# Patient Record
Sex: Male | Born: 1960
Health system: Southern US, Community
[De-identification: ages and names within clinical notes are randomized; demographics above are authoritative.]

## PROBLEM LIST (undated history)

## (undated) DIAGNOSIS — E039 Hypothyroidism, unspecified: Secondary | ICD-10-CM

## (undated) DIAGNOSIS — M1990 Unspecified osteoarthritis, unspecified site: Secondary | ICD-10-CM

## (undated) DIAGNOSIS — C801 Malignant (primary) neoplasm, unspecified: Secondary | ICD-10-CM

## (undated) DIAGNOSIS — J309 Allergic rhinitis, unspecified: Secondary | ICD-10-CM

## (undated) DIAGNOSIS — R079 Chest pain, unspecified: Secondary | ICD-10-CM

## (undated) DIAGNOSIS — E119 Type 2 diabetes mellitus without complications: Secondary | ICD-10-CM

## (undated) DIAGNOSIS — F419 Anxiety disorder, unspecified: Secondary | ICD-10-CM

## (undated) DIAGNOSIS — I10 Essential (primary) hypertension: Secondary | ICD-10-CM

## (undated) HISTORY — DX: Chest pain, unspecified: R07.9

## (undated) HISTORY — PX: TONSILLECTOMY: SUR1361

## (undated) HISTORY — DX: Allergic rhinitis, unspecified: J30.9

## (undated) HISTORY — PX: COLONOSCOPY: SHX174

## (undated) HISTORY — DX: Anxiety disorder, unspecified: F41.9

## (undated) HISTORY — DX: Unspecified osteoarthritis, unspecified site: M19.90

## (undated) HISTORY — PX: KNEE ARTHROSCOPY: SUR90

## (undated) HISTORY — DX: Type 2 diabetes mellitus without complications: E11.9

## (undated) HISTORY — PX: ARTHROPLASTY: SHX135

---

## 1986-09-22 HISTORY — PX: ORIF FACIAL FRACTURE: SHX2118

## 2008-01-04 ENCOUNTER — Encounter: Admission: RE | Admit: 2008-01-04 | Discharge: 2008-02-10 | Payer: Self-pay | Admitting: Orthopedic Surgery

## 2010-09-22 DIAGNOSIS — C801 Malignant (primary) neoplasm, unspecified: Secondary | ICD-10-CM

## 2010-09-22 HISTORY — DX: Malignant (primary) neoplasm, unspecified: C80.1

## 2010-09-22 HISTORY — PX: PROSTATECTOMY: SHX69

## 2011-02-27 ENCOUNTER — Other Ambulatory Visit (HOSPITAL_COMMUNITY): Payer: Self-pay | Admitting: Urology

## 2011-02-27 DIAGNOSIS — C61 Malignant neoplasm of prostate: Secondary | ICD-10-CM

## 2011-03-05 ENCOUNTER — Encounter (HOSPITAL_COMMUNITY): Payer: Self-pay

## 2011-03-05 ENCOUNTER — Encounter (HOSPITAL_COMMUNITY)
Admission: RE | Admit: 2011-03-05 | Discharge: 2011-03-05 | Disposition: A | Discharge status: Home / Self Care | Payer: 59 | Source: Ambulatory Visit | Attending: Urology | Admitting: Urology

## 2011-03-05 DIAGNOSIS — C61 Malignant neoplasm of prostate: Secondary | ICD-10-CM | POA: Insufficient documentation

## 2011-03-05 HISTORY — DX: Malignant (primary) neoplasm, unspecified: C80.1

## 2011-03-05 MED ORDER — TECHNETIUM TC 99M MEDRONATE IV KIT
23.5000 | PACK | Freq: Once | INTRAVENOUS | Status: AC | PRN
  Administered 2011-03-05: 24 via INTRAVENOUS

## 2011-04-11 ENCOUNTER — Encounter (HOSPITAL_COMMUNITY): Payer: 59

## 2011-04-11 ENCOUNTER — Other Ambulatory Visit: Payer: Self-pay | Admitting: Urology

## 2011-04-11 ENCOUNTER — Ambulatory Visit (HOSPITAL_COMMUNITY)
Admission: RE | Admit: 2011-04-11 | Discharge: 2011-04-11 | Disposition: A | Discharge status: Home / Self Care | Payer: 59 | Source: Ambulatory Visit | Attending: Urology | Admitting: Urology

## 2011-04-11 ENCOUNTER — Other Ambulatory Visit (HOSPITAL_COMMUNITY): Payer: Self-pay | Admitting: Urology

## 2011-04-11 DIAGNOSIS — Z01818 Encounter for other preprocedural examination: Secondary | ICD-10-CM

## 2011-04-11 DIAGNOSIS — E119 Type 2 diabetes mellitus without complications: Secondary | ICD-10-CM | POA: Insufficient documentation

## 2011-04-11 DIAGNOSIS — Z01810 Encounter for preprocedural cardiovascular examination: Secondary | ICD-10-CM | POA: Insufficient documentation

## 2011-04-11 DIAGNOSIS — C61 Malignant neoplasm of prostate: Secondary | ICD-10-CM | POA: Insufficient documentation

## 2011-04-11 DIAGNOSIS — Z01812 Encounter for preprocedural laboratory examination: Secondary | ICD-10-CM | POA: Insufficient documentation

## 2011-04-11 LAB — CBC
Hemoglobin: 13.8 g/dL (ref 13.0–17.0)
MCV: 86.9 fL (ref 78.0–100.0)
Platelets: 238 10*3/uL (ref 150–400)
RBC: 4.5 MIL/uL (ref 4.22–5.81)
WBC: 6.3 10*3/uL (ref 4.0–10.5)

## 2011-04-11 LAB — SURGICAL PCR SCREEN
MRSA, PCR: NEGATIVE
Staphylococcus aureus: NEGATIVE

## 2011-04-11 LAB — BASIC METABOLIC PANEL
BUN: 13 mg/dL (ref 6–23)
Chloride: 106 mEq/L (ref 96–112)
GFR calc Af Amer: >60 mL/min (ref >60)
Potassium: 4.3 mEq/L (ref 3.5–5.1)

## 2011-04-14 ENCOUNTER — Other Ambulatory Visit: Payer: Self-pay | Admitting: Urology

## 2011-04-14 ENCOUNTER — Inpatient Hospital Stay (HOSPITAL_COMMUNITY)
Admission: RE | Admit: 2011-04-14 | Discharge: 2011-04-15 | DRG: 708 | Disposition: A | Discharge status: Home / Self Care | Payer: 59 | Source: Ambulatory Visit | Attending: Urology | Admitting: Urology

## 2011-04-14 DIAGNOSIS — Z794 Long term (current) use of insulin: Secondary | ICD-10-CM

## 2011-04-14 DIAGNOSIS — C61 Malignant neoplasm of prostate: Principal | ICD-10-CM | POA: Diagnosis present

## 2011-04-14 DIAGNOSIS — E119 Type 2 diabetes mellitus without complications: Secondary | ICD-10-CM | POA: Diagnosis present

## 2011-04-14 LAB — TYPE AND SCREEN
ABO/RH(D): A NEG
Antibody Screen: NEGATIVE

## 2011-04-14 LAB — HEMOGLOBIN AND HEMATOCRIT, BLOOD
HCT: 39.3 % (ref 39.0–52.0)
Hemoglobin: 13.6 g/dL (ref 13.0–17.0)

## 2011-04-14 LAB — GLUCOSE, CAPILLARY
Glucose-Capillary: 159 mg/dL — ABNORMAL HIGH (ref 70–99)
Glucose-Capillary: 259 mg/dL — ABNORMAL HIGH (ref 70–99)

## 2011-04-15 LAB — HEMOGLOBIN AND HEMATOCRIT, BLOOD
HCT: 35.7 % — ABNORMAL LOW (ref 39.0–52.0)
Hemoglobin: 12.5 g/dL — ABNORMAL LOW (ref 13.0–17.0)

## 2011-04-18 NOTE — Op Note (Signed)
NAMECORMICK, MOSS NO.:  [id number]  MEDICAL RECORD NO.:  [id number]  LOCATION:  1431                         FACILITY:  [facility]  PHYSICIAN:  Heloise Purpura, MD      DATE OF BIRTH:  [birth year]  DATE OF PROCEDURE:  04/14/2011 DATE OF DISCHARGE:                              OPERATIVE REPORT   PREOPERATIVE DIAGNOSIS:  Clinically localized adenocarcinoma of the prostate (clinical stage T2a N0 Mx).  POSTOPERATIVE DIAGNOSIS:  Clinically localized adenocarcinoma of the prostate (clinical stage T2a N0 Mx).  PROCEDURES: 1. Robotic-assisted laparoscopic radical prostatectomy (bilateral     nerve sparing). 2. Bilateral robotic-assisted laparoscopic pelvic lymphadenectomy.  SURGEON:  Heloise Purpura, M.D.  ASSISTANT:  Delia Chimes, [facility]  ANESTHESIA:  General.  COMPLICATIONS:  None.  ESTIMATED BLOOD LOSS:  100 cc.  INTRAVENOUS FLUIDS:  2500 cc of crystalloid.  SPECIMENS: 1. Prostate and seminal vesicles. 2. Right pelvic lymph nodes. 3. Left pelvic lymph nodes.  DISPOSITION:  Specimens to Pathology.  DRAINS: 1. 20-French coude catheter. 2. #19 Blake pelvic drain.  INDICATION:  Mr. Koelzer is a 50 year old gentleman with clinically localized prostate cancer.  After discussing management options for treatment, he elected to proceed with surgical therapy in the above procedures.  The potential risks, complications, and alternative treatment options were discussed in detail and informed consent obtained.  DESCRIPTION OF PROCEDURE:  The patient was taken to the operating room and a general anesthetic was administered.  He was given preoperative antibiotics, placed in the dorsal lithotomy position, and prepped and draped in the usual sterile fashion.  Next, preoperative time-out was performed.  A Foley catheter was then inserted into the bladder and a site was selected just superior to the umbilicus for placement of the camera port.  This was placed using a  standard open Hasson technique, which allowed entry into the peritoneal cavity under direct vision without difficulty.  A 12 mm port was then placed and pneumoperitoneum established.  With the 0-degree lens, the abdomen was inspected and there was no evidence of any intra-abdominal injuries or other abnormalities.  The remaining ports were then placed.  8 mm robotic ports were placed in the left lower quadrant, far left lower quadrant, and right lower quadrant.  A 5 mm port was placed in the right upper quadrant.  A 12 mm port was placed in the far right lateral abdominal wall for laparoscopic assistance.  All ports were placed under direct vision and without difficulty.  The surgical cart was then docked.  With the aid of the cautery scissors, the bladder was reflected posteriorly allowing entry into space of Retzius and identification of the endopelvic fascia and prostate.  The endopelvic fascia was then incised from the apex of the prostate back to the base bilaterally and the underlying levator muscle fibers were swept laterally off the prostate thereby isolating the dorsal venous complex.  The dorsal vein was then stapled and divided with a 45 mm Flex Echelon stapler.  The bladder neck was then identified with the aid of Foley catheter manipulation and was divided anteriorly.  This exposed the Foley catheter.  The Foley catheter balloon was deflated and the catheter  was brought into the operative field and used to retract the prostate anteriorly.  The posterior bladder neck was then divided and dissection proceeded between the bladder and prostate until the vasa deferentia and seminal vesicles were identified.  The vasa deferentia were isolated, divided, and lifted anteriorly and seminal vesicles were dissected down to their tips with care to control the seminal vesicle arterial blood supply.  These structures were then lifted anteriorly and the space between Denonvilliers fascia and  the anterior rectum was developed with a combination of blunt and sharp dissection.  The neurovascular bundles were then released bilaterally.  After the lateral prostatic fascia was sharply incised and the vascular pedicles of prostate were ligated with Hem-o-lok clips and divided with sharp cold scissor dissection between the prostate and neurovascular bundles.  On the right side of the prostate, a partial nerve-sparing procedure was performed allowing a few millimeters periprosthetic fatty tissue to remain on the prostate, considering his biopsy pathology.  The neurovascular bundles were then swept off the apex of prostate and urethra and urethra was sharply transected allowing the prostate specimen to be disarticulated.  The pelvis was then copiously irrigated and hemostasis was ensured.  There was no evidence of a rectal injury.  Attention then turned to the right pelvic sidewall.  The fibrofatty tissue between the external iliac vein, confluence of the iliac vessels, hypogastric artery, and Cooper ligament was dissected free from the pelvic sidewall with care to preserve the obturator nerve.  Hem-o-lok clips were used for lymphostasis and hemostasis.  An identical procedure was then performed on the contralateral side with both lymphatic packets subsequently removed for permanent pathologic analysis.  Attention then turned to the urethral anastomosis.  A 2-0 Vicryl slip-knot was placed between Denonvilliers fascia, the posterior bladder neck, and the posterior urethra to reapproximate these structures.  A double-armed 3-0 Monocryl running anastomosis was then performed between the urethra and bladder and a watertight and tension-free manner.  The new 20-French coude catheter was then inserted into the bladder and irrigated.  There were no blood clots within the bladder and the anastomosis appeared to be watertight. The surgical cart was then undocked.  A #19 Blake drain had been  placed through the left lateral robotic port site and was positioned appropriately within the pelvis.  It was secured to skin with a nylon suture.  The right lateral 12-mm port site was closed with a 0 Vicryl suture placed laparoscopically.  All remaining ports were removed under direct vision and the prostate and lymphatic packets were removed intact within the Endopouch retrieval bag via the periumbilical port site. This fascial opening was then closed with 2 running 0-Vicryl sutures.  All port sites were injected with 0.25% Marcaine and reapproximated at the skin level with staples.  Sterile dressings were applied.  The patient appeared to tolerate the procedure well without complications.  He was able to be extubated and transferred to recovery unit in satisfactory condition.     Heloise Purpura, MD     LB/MEDQ  D:  04/14/2011  T:  04/15/2011  Job:  [id number]  Electronically Signed by Heloise Purpura MD on 04/18/2011 11:02:27 PM

## 2013-05-05 ENCOUNTER — Encounter: Payer: Self-pay | Admitting: Gastroenterology

## 2013-06-13 ENCOUNTER — Ambulatory Visit (AMBULATORY_SURGERY_CENTER): Payer: Self-pay | Admitting: *Deleted

## 2013-06-13 VITALS — Ht 70.0 in | Wt 185.8 lb

## 2013-06-13 DIAGNOSIS — Z1211 Encounter for screening for malignant neoplasm of colon: Secondary | ICD-10-CM

## 2013-06-13 MED ORDER — MOVIPREP 100 G PO SOLR: 1.0000 | Freq: Once | ORAL | Status: DC

## 2013-06-13 NOTE — Progress Notes (Signed)
No egg or soy allergy. No anesthesia problems.  

## 2013-06-14 ENCOUNTER — Encounter: Payer: Self-pay | Admitting: Gastroenterology

## 2013-06-27 ENCOUNTER — Ambulatory Visit (AMBULATORY_SURGERY_CENTER): Payer: 59 | Admitting: Gastroenterology

## 2013-06-27 ENCOUNTER — Encounter: Payer: Self-pay | Admitting: Gastroenterology

## 2013-06-27 VITALS — BP 131/49 | HR 56 | Temp 98.7°F | Resp 14 | Ht 70.0 in | Wt 185.0 lb

## 2013-06-27 DIAGNOSIS — D126 Benign neoplasm of colon, unspecified: Secondary | ICD-10-CM

## 2013-06-27 DIAGNOSIS — Z1211 Encounter for screening for malignant neoplasm of colon: Secondary | ICD-10-CM

## 2013-06-27 MED ORDER — SODIUM CHLORIDE 0.9 % IV SOLN: 500.0000 mL | INTRAVENOUS | Status: DC

## 2013-06-27 NOTE — Progress Notes (Signed)
Procedure ends, to recovery, report given and VSS. 

## 2013-06-27 NOTE — Patient Instructions (Signed)
YOU HAD AN ENDOSCOPIC PROCEDURE TODAY AT THE [facility]: Refer to the procedure report that was given to you for any specific questions about what was found during the examination.  If the procedure report does not answer your questions, please call your gastroenterologist to clarify.  If you requested that your care partner not be given the details of your procedure findings, then the procedure report has been included in a sealed envelope for you to review at your convenience later.  YOU SHOULD EXPECT: Some feelings of bloating in the abdomen. Passage of more gas than usual.  Walking can help get rid of the air that was put into your GI tract during the procedure and reduce the bloating. If you had a lower endoscopy (such as a colonoscopy or flexible sigmoidoscopy) you may notice spotting of blood in your stool or on the toilet paper. If you underwent a bowel prep for your procedure, then you may not have a normal bowel movement for a few days.  DIET: Your first meal following the procedure should be a light meal and then it is ok to progress to your normal diet.  A half-sandwich or bowl of soup is an example of a good first meal.  Heavy or fried foods are harder to digest and may make you feel nauseous or bloated.  Likewise meals heavy in dairy and vegetables can cause extra gas to form and this can also increase the bloating.  Drink plenty of fluids but you should avoid alcoholic beverages for 24 hours.  ACTIVITY: Your care partner should take you home directly after the procedure.  You should plan to take it easy, moving slowly for the rest of the day.  You can resume normal activity the day after the procedure however you should NOT DRIVE or use heavy machinery for 24 hours (because of the sedation medicines used during the test).    SYMPTOMS TO REPORT IMMEDIATELY: A gastroenterologist can be reached at any hour.  During normal business hours, 8:30 AM to 5:00 PM Monday through Friday,  call [phone].  After hours and on weekends, please call the GI answering service at [phone] who will take a message and have the physician on call contact you.   Following lower endoscopy (colonoscopy or flexible sigmoidoscopy):  Excessive amounts of blood in the stool  Significant tenderness or worsening of abdominal pains  Swelling of the abdomen that is new, acute  Fever of 100F or higher    FOLLOW UP: If any biopsies were taken you will be contacted by phone or by letter within the next 1-3 weeks.  Call your gastroenterologist if you have not heard about the biopsies in 3 weeks.  Our staff will call the home number listed on your records the next business day following your procedure to check on you and address any questions or concerns that you may have at that time regarding the information given to you following your procedure. This is a courtesy call and so if there is no answer at the home number and we have not heard from you through the emergency physician on call, we will assume that you have returned to your regular daily activities without incident.  SIGNATURES/CONFIDENTIALITY: You and/or your care partner have signed paperwork which will be entered into your electronic medical record.  These signatures attest to the fact that that the information above on your After Visit Summary has been reviewed and is understood.  Full responsibility of the confidentiality   of this discharge information lies with you and/or your care-partner.     

## 2013-06-27 NOTE — Op Note (Signed)
[facility] [address] [place], [address]   COLONOSCOPY PROCEDURE REPORT  PATIENT: Charles Foley, Charles Foley  MR#: [id number] BIRTHDATE: November 07, 1960 , 52  yrs. old GENDER: Male ENDOSCOPIST: Mardella Layman, MD, [facility] REFERRED XB:JYNWG Yetta Barre, PA-C . PROCEDURE DATE:  06/27/2013 PROCEDURE:   Colonoscopy with biopsy First Screening Colonoscopy - Avg.  risk and is 50 yrs.  old or older Yes.      History of Adenoma - Now for follow-up colonoscopy & has been > or = to 3 yrs.  N/A  Polyps Removed Today? Yes. ASA CLASS:   Class III INDICATIONS:average risk screening. MEDICATIONS: propofol (Diprivan) 200mg  IV  DESCRIPTION OF PROCEDURE:   After the risks benefits and alternatives of the procedure were thoroughly explained, informed consent was obtained.  A digital rectal exam revealed no abnormalities of the rectum.   The LB [id number]  endoscope was introduced through the anus and advanced to the cecum, which was identified by both the appendix and ileocecal valve. No adverse events experienced.   The quality of the prep was poor, using MoviPrep  The instrument was then slowly withdrawn as the colon was fully examined.      COLON FINDINGS: A small smooth sessile polyp was found in the sigmoid colon.  A biopsy of the area was performed using cold forceps.   The colon was otherwise normal.  There was no diverticulosis, inflammation, polyps or cancers unless previously stated.  Retroflexed views revealed no abnormalities. The time to cecum=3 minutes 47 seconds.  Withdrawal time=8 minutes 20 seconds. The scope was withdrawn and the procedure completed. COMPLICATIONS: There were no complications.  ENDOSCOPIC IMPRESSION: 1.   Small sessile polyp was found in the sigmoid colon; biopsy of the area was performed using cold forceps 2.   The colon was otherwise normal ...very POOR PREP however....  RECOMMENDATIONS: 1.  Await pathology results 2.  Repeat Colonoscopy in 3  years with "double prep".   eSigned:  Mardella Layman, MD, [facility] 06/27/2013 11:02 AM   cc:

## 2013-06-27 NOTE — Progress Notes (Signed)
Called to room to assist during endoscopic procedure.  Patient ID and intended procedure confirmed with present staff. Received instructions for my participation in the procedure from the performing physician.  

## 2013-06-27 NOTE — Progress Notes (Signed)
Patient did not experience any of the following events: a burn prior to discharge; a fall within the facility; wrong site/side/patient/procedure/implant event; or a hospital transfer or hospital admission upon discharge from the facility. (G8907) Patient did not have preoperative order for IV antibiotic SSI prophylaxis. (G8918)  

## 2013-06-28 ENCOUNTER — Telehealth: Payer: Self-pay | Admitting: *Deleted

## 2013-06-28 NOTE — Telephone Encounter (Signed)
  Follow up Call-  Call back number 06/27/2013  Post procedure Call Back phone  # [phone]  Permission to leave phone message Yes     Patient questions:  Do you have a fever, pain , or abdominal swelling? no Pain Score  0 *  Have you tolerated food without any problems? yes  Have you been able to return to your normal activities? yes  Do you have any questions about your discharge instructions: Diet   no Medications  no Follow up visit  no  Do you have questions or concerns about your Care? no  Actions: * If pain score is 4 or above: No action needed, pain <4.

## 2013-07-01 ENCOUNTER — Encounter: Payer: Self-pay | Admitting: Gastroenterology

## 2014-11-02 DIAGNOSIS — R079 Chest pain, unspecified: Secondary | ICD-10-CM

## 2014-11-02 HISTORY — DX: Chest pain, unspecified: R07.9

## 2014-11-03 ENCOUNTER — Ambulatory Visit (INDEPENDENT_AMBULATORY_CARE_PROVIDER_SITE_OTHER): Payer: 59 | Admitting: Internal Medicine

## 2014-11-03 ENCOUNTER — Encounter: Payer: Self-pay | Admitting: Internal Medicine

## 2014-11-03 VITALS — BP 120/80 | HR 79 | Ht 69.0 in | Wt 187.0 lb

## 2014-11-03 DIAGNOSIS — R079 Chest pain, unspecified: Secondary | ICD-10-CM

## 2014-11-03 MED ORDER — ATORVASTATIN CALCIUM 10 MG PO TABS: 10.0000 mg | ORAL_TABLET | Freq: Every day | ORAL | Status: DC

## 2014-11-03 NOTE — Progress Notes (Addendum)
Cardiology Office Note   Date:  11/03/2014   ID:  Charles Foley, DOB Mar 24, 1961, MRN [id number]  PCP:  Octavio Graves, DO  Cardiologist:   Dorris Carnes, MD   Patient is a 54 yo who presents for evaluation of CP     History of Present Illness: Charles Foley is a 54 y.o. male with a history of DM (diagnosed in 4s) Patient has a real sharp pain every once in a while  Left lateral chest .  Hurts  With activity  Not rest  No PND   Occurs 1time  per couple weeks.   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 acetaminophen (TYLENOL) 325 MG tablet Take 650 mg by mouth every 6 (six) hours as needed for pain.    Marland Kitchen ALPRAZolam (XANAX) 0.5 MG tablet Take 0.5 mg by mouth at bedtime as needed for sleep.    Marland Kitchen ibuprofen (ADVIL,MOTRIN) 200 MG tablet Take 200 mg by mouth every 6 (six) hours as needed for pain.    Marland Kitchen insulin detemir (LEVEMIR) 100 UNIT/ML injection Inject 90 Units into the skin at bedtime.    . insulin lispro (HUMALOG) 100 UNIT/ML injection Inject 10 Units into the skin 3 (three) times daily before meals.    Marland Kitchen levothyroxine (SYNTHROID, LEVOTHROID) 25 MCG tablet Take 25 mcg by mouth daily before breakfast.    . meloxicam (MOBIC) 7.5 MG tablet Take 7.5 mg by mouth daily.     No current facility-administered medications for this visit.    Allergies:   Review of patient's allergies indicates no known allergies.   Past Medical History  Diagnosis Date  . Diabetes mellitus without complication   . Anxiety   . Arthritis   . Cancer 2012    prostate  . Allergic rhinitis   . Chest pain     Past Surgical History  Procedure Laterality Date  . Prostatectomy  2012  . Arthroplasty Bilateral   . Orif facial fracture  1988     Social History:  The  patient  reports that he quit smoking about 5 years ago. He quit smokeless tobacco use about 11 years ago. He reports that he drinks alcohol. He reports that he does not use illicit drugs.   Family History:  The patient's family history includes Diabetes in his mother; Heart disease in his mother. There is no history of Colon cancer.    ROS:  Please see the history of present illness. All other systems are reviewed and  Negative to the above problem except as noted.    PHYSICAL EXAM: VS:  BP 120/80 mmHg  Pulse 79  Ht 5\' 9"  (1.753 m)  Wt 187 lb (84.823 kg)  BMI 27.60 kg/m2  GEN: Well nourished, well developed, in no acute distress HEENT: normal Neck: no JVD, carotid bruits, or masses Cardiac: RRR; no murmurs, rubs, or gallops,no edema  Respiratory:  clear to auscultation bilaterally, normal work of breathing GI: soft, nontender, nondistended, + BS  No hepatomegaly  MS: no deformity Moving all extremities   Skin: warm and dry, no rash Neuro:  Strength and sensation are intact Psych: euthymic mood, full affect   EKG:  NSR  79 bpm     Lipid Panel No results found for: CHOL, TRIG, HDL, CHOLHDL, VLDL, LDLCALC, LDLDIRECT    Wt Readings from Last 3 Encounters:  11/03/14 187 lb (84.823 kg)  06/27/13 185 lb (83.915 kg)  06/13/13 185 lb 12.8 oz (84.278 kg)      ASSESSMENT AND PLAN:  1.  CP   I do not think this represents an anginal equivalent  Too erratic/infrequent.   May be musculoskeleletal  I would not recomm testing    2.  HL  With DM he should have very tight control of LDL  LDL on last was 108  Would recomm that he start lipitor 10.     Current medicines are reviewed at length with the patient today.  The patient does not have concerns regarding medicines.  The following changes have been made: Lipitor added  No labs were ordered today  He will need fasting lipids and AST in 8 wks   Disposition:   FU prn if sympotms changes  Patient would prefer to f/u with primary  MD  WIll need lipids followed    Signed, Dorris Carnes, MD  11/03/2014 4:02 PM    [address], [place], [place]  [address] Phone: [phone]; Fax: [phone]

## 2014-11-03 NOTE — Patient Instructions (Signed)
START LIPITOR 10 MG 1 TAB DAILY

## 2015-01-02 ENCOUNTER — Other Ambulatory Visit: Payer: Self-pay | Admitting: Internal Medicine

## 2015-03-07 ENCOUNTER — Other Ambulatory Visit: Payer: Self-pay

## 2015-03-07 DIAGNOSIS — R1909 Other intra-abdominal and pelvic swelling, mass and lump: Secondary | ICD-10-CM

## 2015-03-19 ENCOUNTER — Ambulatory Visit
Admission: RE | Admit: 2015-03-19 | Discharge: 2015-03-19 | Disposition: A | Discharge status: Home / Self Care | Payer: 59 | Source: Ambulatory Visit | Attending: General Surgery | Admitting: General Surgery

## 2015-03-19 MED ORDER — IOPAMIDOL (ISOVUE-300) INJECTION 61%
100.0000 mL | Freq: Once | INTRAVENOUS | Status: AC | PRN
  Administered 2015-03-19: 100 mL via INTRAVENOUS

## 2016-04-24 ENCOUNTER — Encounter: Payer: Self-pay | Admitting: Internal Medicine

## 2016-08-04 ENCOUNTER — Other Ambulatory Visit: Payer: Self-pay | Admitting: *Deleted

## 2016-08-04 MED ORDER — LEVOTHYROXINE SODIUM 25 MCG PO TABS: 25.0000 ug | ORAL_TABLET | Freq: Every day | ORAL | 1 refills | Status: DC

## 2016-08-04 MED ORDER — ALPRAZOLAM 0.5 MG PO TABS: 0.5000 mg | ORAL_TABLET | Freq: Every evening | ORAL | 0 refills | Status: DC | PRN

## 2016-08-12 ENCOUNTER — Encounter: Payer: Self-pay | Admitting: Physician Assistant

## 2016-08-12 ENCOUNTER — Ambulatory Visit (INDEPENDENT_AMBULATORY_CARE_PROVIDER_SITE_OTHER): Payer: 59 | Admitting: Physician Assistant

## 2016-08-12 VITALS — BP 148/70 | HR 61 | Temp 97.3°F | Ht 69.0 in | Wt 192.2 lb

## 2016-08-12 DIAGNOSIS — Z8546 Personal history of malignant neoplasm of prostate: Secondary | ICD-10-CM

## 2016-08-12 DIAGNOSIS — J301 Allergic rhinitis due to pollen: Secondary | ICD-10-CM | POA: Insufficient documentation

## 2016-08-12 DIAGNOSIS — M158 Other polyosteoarthritis: Secondary | ICD-10-CM | POA: Diagnosis not present

## 2016-08-12 DIAGNOSIS — M159 Polyosteoarthritis, unspecified: Secondary | ICD-10-CM | POA: Insufficient documentation

## 2016-08-12 DIAGNOSIS — M6740 Ganglion, unspecified site: Secondary | ICD-10-CM

## 2016-08-12 DIAGNOSIS — E109 Type 1 diabetes mellitus without complications: Secondary | ICD-10-CM | POA: Diagnosis not present

## 2016-08-12 DIAGNOSIS — E039 Hypothyroidism, unspecified: Secondary | ICD-10-CM | POA: Diagnosis not present

## 2016-08-12 DIAGNOSIS — Z23 Encounter for immunization: Secondary | ICD-10-CM | POA: Diagnosis not present

## 2016-08-12 LAB — BAYER DCA HB A1C WAIVED: HB A1C (BAYER DCA - WAIVED): 10 % — ABNORMAL HIGH (ref <7.0)

## 2016-08-12 NOTE — Patient Instructions (Signed)
Ganglion Cyst Introduction A ganglion cyst is a noncancerous, fluid-filled lump that occurs near joints or tendons. The ganglion cyst grows out of a joint or the lining of a tendon. It most often develops in the hand or wrist, but it can also develop in the shoulder, elbow, hip, knee, ankle, or foot. The round or oval ganglion cyst can be the size of a pea or larger than a grape. Increased activity may enlarge the size of the cyst because more fluid starts to build up. What are the causes? It is not known what causes a ganglion cyst to grow. However, it may be related to:  Inflammation or irritation around the joint.  An injury.  Repetitive movements or overuse.  Arthritis. What increases the risk? Risk factors include:  Being a woman.  Being age 78-50. What are the signs or symptoms? Symptoms may include:  A lump. This most often appears on the hand or wrist, but it can occur in other areas of the body.  Tingling.  Pain.  Numbness.  Muscle weakness.  Weak grip.  Less movement in a joint. How is this diagnosed? Ganglion cysts are most often diagnosed based on a physical exam. Your health care provider will feel the lump and may shine a light alongside it. If it is a ganglion cyst, a light often shines through it. Your health care provider may order an X-ray, ultrasound, or MRI to rule out other conditions. How is this treated? Ganglion cysts usually go away on their own without treatment. If pain or other symptoms are involved, treatment may be needed. Treatment is also needed if the ganglion cyst limits your movement or if it gets infected. Treatment may include:  Wearing a brace or splint on your wrist or finger.  Taking anti-inflammatory medicine.  Draining fluid from the lump with a needle (aspiration).  Injecting a steroid into the joint.  Surgery to remove the ganglion cyst. Follow these instructions at home:  Do not press on the ganglion cyst, poke it with a  needle, or hit it.  Take medicines only as directed by your health care provider.  Wear your brace or splint as directed by your health care provider.  Watch your ganglion cyst for any changes.  Keep all follow-up visits as directed by your health care provider. This is important. Contact a health care provider if:  Your ganglion cyst becomes larger or more painful.  You have increased redness, red streaks, or swelling.  You have pus coming from the lump.  You have weakness or numbness in the affected area.  You have a fever or chills. This information is not intended to replace advice given to you by your health care provider. Make sure you discuss any questions you have with your health care provider. Document Released: 09/05/2000 Document Revised: 02/14/2016 Document Reviewed: 02/21/2014  2017 [employer]

## 2016-08-12 NOTE — Progress Notes (Signed)
BP (!) 148/70   Pulse 61   Temp 97.3 F (36.3 C) (Oral)   Ht 5' 9"  (1.753 m)   Wt 192 lb 3.2 oz (87.2 kg)   BMI 28.38 kg/m    Subjective:    Patient ID: Charles Foley, male    DOB: 1960-12-31, 55 y.o.   MRN: [id number]  Charles Foley is a 55 y.o. male presenting on 08/12/2016 for Follow-up (Labs- requesting PSA also ) and Diabetes  HPI Patient here to be established as new patient at [facility].  This patient is known to me from [facility]. This patient comes in for periodic recheck on medications and conditions. All medications are reviewed today. There are no reports of any problems with the medications. All of the medical conditions are reviewed and updated.  Lab work is reviewed and will be ordered as medically necessary.  He had been switched to Lucianne Muss this past year because of insurance pain for better. He is using 80 units per day. We will draw an A1c today. He is using 20 units typically of Humalog if his meal at supper. During the day he may use between 5 and 10 units at mealtime depending on what he eats. He states that he has been feeling fairly good overall with his diabetes.  He needs a PSA drawn today because of his history of prostate cancer. He has had successful eradication of the cancer. And this is just for monitoring state.  Past Medical History:  Diagnosis Date  . Allergic rhinitis   . Anxiety   . Arthritis   . Cancer [facility]) 2012   prostate  . Chest pain   . Diabetes mellitus without complication ([place])    Relevant past medical, surgical, family and social history reviewed and updated as indicated. Interim medical history since our last visit reviewed. Allergies and medications reviewed and updated.   Data reviewed from any sources in EPIC.  Review of Systems  Constitutional: Positive for fatigue. Negative for appetite change.  HENT: Negative.   Eyes: Negative.  Negative for pain and visual disturbance.  Respiratory:  Negative.  Negative for cough, chest tightness, shortness of breath and wheezing.   Cardiovascular: Negative.  Negative for chest pain, palpitations and leg swelling.  Gastrointestinal: Negative.  Negative for abdominal pain, diarrhea, nausea and vomiting.  Endocrine: Negative.   Genitourinary: Negative.   Musculoskeletal: Positive for arthralgias and joint swelling.  Skin: Negative.  Negative for color change and rash.  Neurological: Negative.  Negative for weakness, numbness and headaches.  Psychiatric/Behavioral: Negative.    Diabetic Foot Exam - Simple   Simple Foot Form Diabetic Foot exam was performed with the following findings:  Yes 08/12/2016  1:30 PM  Visual Inspection No deformities, no ulcerations, no other skin breakdown bilaterally:  Yes Sensation Testing Intact to touch and monofilament testing bilaterally:  Yes Pulse Check Comments      Social History   Social History  . Marital status: Married    Spouse name: N/A  . Number of children: N/A  . Years of education: N/A   Occupational History  . Not on file.   Social History Main Topics  . Smoking status: Former Smoker    Quit date: 04/10/2009  . Smokeless tobacco: Former Systems developer    Quit date: 01/10/2003  . Alcohol use Yes  . Drug use: No  . Sexual activity: Not on file   Other Topics Concern  . Not on file   Social History  Narrative  . No narrative on file    Past Surgical History:  Procedure Laterality Date  . ARTHROPLASTY Bilateral   . ORIF FACIAL FRACTURE  1988  . PROSTATECTOMY  2012    Family History  Problem Relation Age of Onset  . Diabetes Mother   . Heart disease Mother   . Colon cancer Neg Hx       Medication List       Accurate as of 08/12/16  1:28 PM. Always use your most recent med list.          acetaminophen 325 MG tablet Commonly known as:  TYLENOL Take 650 mg by mouth every 6 (six) hours as needed for pain.   ALPRAZolam 1 MG tablet Commonly known as:  XANAX Take 1  mg by mouth at bedtime as needed.   atorvastatin 10 MG tablet Commonly known as:  LIPITOR Take 1 tablet (10 mg total) by mouth daily.   BASAGLAR KWIKPEN 100 UNIT/ML Sopn Inject 150 Units into the skin.   Biotin 1000 MCG tablet Take 1,000 mcg by mouth 3 (three) times daily.   ibuprofen 200 MG tablet Commonly known as:  ADVIL,MOTRIN Take 200 mg by mouth every 6 (six) hours as needed for pain.   insulin lispro 100 UNIT/ML injection Commonly known as:  HUMALOG Inject 10 Units into the skin 3 (three) times daily before meals.   levothyroxine 25 MCG tablet Commonly known as:  SYNTHROID, LEVOTHROID Take 1 tablet (25 mcg total) by mouth daily before breakfast.   loratadine 10 MG tablet Commonly known as:  CLARITIN Take 10 mg by mouth daily.   meloxicam 7.5 MG tablet Commonly known as:  MOBIC Take 7.5 mg by mouth daily.   multivitamin tablet Take 1 tablet by mouth daily.   pantoprazole 40 MG tablet Commonly known as:  PROTONIX Take 40 mg by mouth daily.          Objective:    BP (!) 148/70   Pulse 61   Temp 97.3 F (36.3 C) (Oral)   Ht 5' 9"  (1.753 m)   Wt 192 lb 3.2 oz (87.2 kg)   BMI 28.38 kg/m   No Known Allergies Wt Readings from Last 3 Encounters:  08/12/16 192 lb 3.2 oz (87.2 kg)  11/03/14 187 lb (84.8 kg)  06/27/13 185 lb (83.9 kg)    Physical Exam  Constitutional: He appears well-developed and well-nourished.  HENT:  Head: Normocephalic and atraumatic.  Eyes: Conjunctivae and EOM are normal. Pupils are equal, round, and reactive to light.  Neck: Normal range of motion. Neck supple.  Cardiovascular: Normal rate, regular rhythm and normal heart sounds.   Pulmonary/Chest: Effort normal and breath sounds normal.  Abdominal: Soft. Bowel sounds are normal.  Musculoskeletal: Normal range of motion.       Right wrist: He exhibits tenderness, swelling and effusion.       Arms: Skin: Skin is warm and dry.    Results for orders placed or performed in  visit on 08/12/16  Bayer DCA Hb A1c Waived  Result Value Ref Range   Bayer DCA Hb A1c Waived 10.0 (H) <7.0 %      Assessment & Plan:   1. Type 1 diabetes mellitus without complication (HCC) - Bayer DCA Hb A1c Waived - CBC with Differential/Platelet - CMP14+EGFR - Lipid panel  2. Personal history of prostate cancer - PSA, total and free  3. Chronic allergic rhinitis due to pollen, unspecified seasonality - Lipid panel  4. Other  osteoarthritis involving multiple joints  5. Hypothyroidism, unspecified type - TSH  6. Ganglion cyst  7. Encounter for immunization - Flu Vaccine QUAD 36+ mos IM   Continue all other maintenance medications as listed above. Educational handout given for ganglion cyst  Follow up plan: Return in about 3 months (around 11/12/2016) for recheck diabetes.  Terald Sleeper PA-C [place] [place] [address]  [place], [place] [address] [phone]   08/12/2016, 1:28 PM

## 2016-08-13 LAB — CMP14+EGFR
A/G RATIO: 1.4 (ref 1.2–2.2)
ALK PHOS: 57 IU/L (ref 39–117)
ALT: 25 IU/L (ref 0–44)
AST: 22 IU/L (ref 0–40)
Albumin: 4.4 g/dL (ref 3.5–5.5)
BUN/Creatinine Ratio: 21 — ABNORMAL HIGH (ref 9–20)
BUN: 19 mg/dL (ref 6–24)
Bilirubin Total: 1.5 mg/dL — ABNORMAL HIGH (ref 0.0–1.2)
CALCIUM: 9.6 mg/dL (ref 8.7–10.2)
CHLORIDE: 101 mmol/L (ref 96–106)
CO2: 26 mmol/L (ref 18–29)
Creatinine, Ser: 0.91 mg/dL (ref 0.76–1.27)
GFR calc Af Amer: 108 mL/min/{1.73_m2} (ref >59)
GFR, EST NON AFRICAN AMERICAN: 93 mL/min/{1.73_m2} (ref >59)
Globulin, Total: 3.1 g/dL (ref 1.5–4.5)
Glucose: 96 mg/dL (ref 65–99)
POTASSIUM: 4.5 mmol/L (ref 3.5–5.2)
Sodium: 140 mmol/L (ref 134–144)
Total Protein: 7.5 g/dL (ref 6.0–8.5)

## 2016-08-13 LAB — CBC WITH DIFFERENTIAL/PLATELET
Basophils Absolute: 0 10*3/uL (ref 0.0–0.2)
Basos: 0 %
EOS (ABSOLUTE): 0.1 10*3/uL (ref 0.0–0.4)
Eos: 2 %
Hematocrit: 41.4 % (ref 37.5–51.0)
Hemoglobin: 14.5 g/dL (ref 12.6–17.7)
IMMATURE GRANULOCYTES: 0 %
Immature Grans (Abs): 0 10*3/uL (ref 0.0–0.1)
Lymphocytes Absolute: 2.5 10*3/uL (ref 0.7–3.1)
Lymphs: 39 %
MCH: 30.7 pg (ref 26.6–33.0)
MCHC: 35 g/dL (ref 31.5–35.7)
MCV: 88 fL (ref 79–97)
MONOS ABS: 0.3 10*3/uL (ref 0.1–0.9)
Monocytes: 5 %
NEUTROS PCT: 54 %
Neutrophils Absolute: 3.5 10*3/uL (ref 1.4–7.0)
PLATELETS: 302 10*3/uL (ref 150–379)
RBC: 4.73 x10E6/uL (ref 4.14–5.80)
RDW: 12.8 % (ref 12.3–15.4)
WBC: 6.4 10*3/uL (ref 3.4–10.8)

## 2016-08-13 LAB — PSA, TOTAL AND FREE
PSA, Free: <0.01 ng/mL
Prostate Specific Ag, Serum: <0.1 ng/mL (ref 0.0–4.0)

## 2016-08-13 LAB — LIPID PANEL
CHOL/HDL RATIO: 2.1 ratio (ref 0.0–5.0)
CHOLESTEROL TOTAL: 136 mg/dL (ref 100–199)
HDL: 65 mg/dL (ref >39)
LDL Calculated: 59 mg/dL (ref 0–99)
TRIGLYCERIDES: 58 mg/dL (ref 0–149)
VLDL Cholesterol Cal: 12 mg/dL (ref 5–40)

## 2016-08-13 LAB — TSH: TSH: 2.21 u[IU]/mL (ref 0.450–4.500)

## 2016-08-19 ENCOUNTER — Telehealth: Payer: Self-pay | Admitting: Physician Assistant

## 2016-08-19 NOTE — Telephone Encounter (Signed)
Patient aware of results.

## 2016-09-02 ENCOUNTER — Other Ambulatory Visit: Payer: Self-pay | Admitting: Physician Assistant

## 2016-09-02 NOTE — Telephone Encounter (Signed)
Last filled 08/04/16, last seen 08/12/16. Route to pool

## 2016-09-09 NOTE — Telephone Encounter (Signed)
Fax request received. TC verified this was called in on 09/07/16.

## 2016-09-10 ENCOUNTER — Other Ambulatory Visit: Payer: Self-pay | Admitting: Physician Assistant

## 2016-09-17 ENCOUNTER — Other Ambulatory Visit: Payer: Self-pay | Admitting: Physician Assistant

## 2016-09-26 ENCOUNTER — Other Ambulatory Visit: Payer: Self-pay | Admitting: *Deleted

## 2016-09-29 MED ORDER — BASAGLAR KWIKPEN 100 UNIT/ML ~~LOC~~ SOPN: PEN_INJECTOR | SUBCUTANEOUS | 4 refills | Status: DC

## 2016-10-17 ENCOUNTER — Other Ambulatory Visit: Payer: Self-pay | Admitting: Physician Assistant

## 2016-11-06 ENCOUNTER — Encounter: Payer: Self-pay | Admitting: Family Medicine

## 2016-11-06 ENCOUNTER — Ambulatory Visit (INDEPENDENT_AMBULATORY_CARE_PROVIDER_SITE_OTHER): Payer: 59 | Admitting: Family Medicine

## 2016-11-06 VITALS — BP 169/72 | HR 69 | Temp 97.0°F | Ht 69.0 in | Wt 196.4 lb

## 2016-11-06 DIAGNOSIS — R6889 Other general symptoms and signs: Secondary | ICD-10-CM | POA: Diagnosis not present

## 2016-11-06 LAB — VERITOR FLU A/B WAIVED
INFLUENZA B: NEGATIVE
Influenza A: NEGATIVE

## 2016-11-06 MED ORDER — OSELTAMIVIR PHOSPHATE 75 MG PO CAPS: 75.0000 mg | ORAL_CAPSULE | Freq: Two times a day (BID) | ORAL | 0 refills | Status: DC

## 2016-11-06 NOTE — Progress Notes (Signed)
BP (!) 169/72   Pulse 69   Temp 97 F (36.1 C) (Oral)   Ht 5\' 9"  (1.753 m)   Wt 196 lb 6.4 oz (89.1 kg)   BMI 29.00 kg/m    Subjective:    Patient ID: Charles Foley, male    DOB: Aug 11, 1961, 56 y.o.   MRN: [id number]  HPI: Charles Foley is a 56 y.o. male presenting on 11/06/2016 for Headache; Generalized Body Aches; and Chills   HPI Cough headache and body aches and chills Patient presents today with a 2 day history of cough and body aches and chills and fevers that has been worsening over the past couple days. He denies any shortness of breath or wheezing. He says there were coworkers who have and diagnosed with the flu as recently as a few days ago. He has been trying to use some over-the-counter Robitussin and Tylenol which is helped some. The cough is been nonproductive and does occasionally keep him up at night. The fever has not been taken home but here in the office is 97.  Relevant past medical, surgical, family and social history reviewed and updated as indicated. Interim medical history since our last visit reviewed. Allergies and medications reviewed and updated.  Review of Systems  Constitutional: Positive for chills and fever.  HENT: Positive for congestion, postnasal drip, rhinorrhea, sinus pressure, sneezing and sore throat. Negative for ear discharge, ear pain and voice change.   Eyes: Negative for pain, discharge, redness and visual disturbance.  Respiratory: Positive for cough. Negative for shortness of breath and wheezing.   Cardiovascular: Negative for chest pain and leg swelling.  Musculoskeletal: Positive for myalgias. Negative for gait problem.  Skin: Negative for rash.  All other systems reviewed and are negative.   Per HPI unless specifically indicated above     Objective:    BP (!) 169/72   Pulse 69   Temp 97 F (36.1 C) (Oral)   Ht 5\' 9"  (1.753 m)   Wt 196 lb 6.4 oz (89.1 kg)   BMI 29.00 kg/m   Wt Readings from Last 3 Encounters:  11/06/16 196  lb 6.4 oz (89.1 kg)  08/12/16 192 lb 3.2 oz (87.2 kg)  11/03/14 187 lb (84.8 kg)    Physical Exam  Constitutional: He is oriented to person, place, and time. He appears well-developed and well-nourished. No distress.  HENT:  Right Ear: Tympanic membrane, external ear and ear canal normal.  Left Ear: Tympanic membrane, external ear and ear canal normal.  Nose: Mucosal edema and rhinorrhea present. No sinus tenderness. No epistaxis. Right sinus exhibits frontal sinus tenderness. Right sinus exhibits no maxillary sinus tenderness. Left sinus exhibits frontal sinus tenderness. Left sinus exhibits no maxillary sinus tenderness.  Mouth/Throat: Uvula is midline and mucous membranes are normal. Posterior oropharyngeal edema and posterior oropharyngeal erythema present. No oropharyngeal exudate or tonsillar abscesses.  Eyes: Conjunctivae are normal. Right eye exhibits no discharge. Left eye exhibits no discharge. No scleral icterus.  Neck: Neck supple. No thyromegaly present.  Cardiovascular: Normal rate, regular rhythm, normal heart sounds and intact distal pulses.   No murmur heard. Pulmonary/Chest: Effort normal and breath sounds normal. No respiratory distress. He has no wheezes. He has no rales.  Musculoskeletal: Normal range of motion. He exhibits no edema.  Lymphadenopathy:    He has no cervical adenopathy.  Neurological: He is alert and oriented to person, place, and time. Coordination normal.  Skin: Skin is warm and dry. No rash noted. He is not  diaphoretic.  Psychiatric: He has a normal mood and affect. His behavior is normal.  Nursing note and vitals reviewed.   Rapid flu: Negative    Assessment & Plan:   Problem List Items Addressed This Visit    None    Visit Diagnoses    Flu-like symptoms    -  Primary   Relevant Medications   oseltamivir (TAMIFLU) 75 MG capsule   Other Relevant Orders   Veritor Flu A/B Waived       Follow up plan: Return if symptoms worsen or fail to  improve.  Counseling provided for all of the vaccine components Orders Placed This Encounter  Procedures  . Veritor Flu A/B Ulyses Southward, MD [facility] 11/06/2016, 11:55 AM

## 2016-12-04 ENCOUNTER — Other Ambulatory Visit: Payer: Self-pay | Admitting: Physician Assistant

## 2016-12-30 ENCOUNTER — Other Ambulatory Visit: Payer: Self-pay | Admitting: Physician Assistant

## 2017-03-17 ENCOUNTER — Ambulatory Visit (INDEPENDENT_AMBULATORY_CARE_PROVIDER_SITE_OTHER): Payer: 59 | Admitting: Physician Assistant

## 2017-03-17 ENCOUNTER — Encounter: Payer: Self-pay | Admitting: Physician Assistant

## 2017-03-17 VITALS — BP 183/93 | HR 64 | Temp 97.8°F | Ht 69.0 in | Wt 193.4 lb

## 2017-03-17 DIAGNOSIS — I10 Essential (primary) hypertension: Secondary | ICD-10-CM | POA: Diagnosis not present

## 2017-03-17 DIAGNOSIS — M158 Other polyosteoarthritis: Secondary | ICD-10-CM

## 2017-03-17 DIAGNOSIS — E109 Type 1 diabetes mellitus without complications: Secondary | ICD-10-CM

## 2017-03-17 LAB — CMP14+EGFR
ALBUMIN: 4.4 g/dL (ref 3.5–5.5)
ALT: 27 IU/L (ref 0–44)
AST: 24 IU/L (ref 0–40)
Albumin/Globulin Ratio: 1.5 (ref 1.2–2.2)
Alkaline Phosphatase: 56 IU/L (ref 39–117)
BILIRUBIN TOTAL: 1 mg/dL (ref 0.0–1.2)
BUN / CREAT RATIO: 20 (ref 9–20)
BUN: 17 mg/dL (ref 6–24)
CHLORIDE: 101 mmol/L (ref 96–106)
CO2: 24 mmol/L (ref 20–29)
Calcium: 9.6 mg/dL (ref 8.7–10.2)
Creatinine, Ser: 0.84 mg/dL (ref 0.76–1.27)
GFR, EST AFRICAN AMERICAN: 112 mL/min/{1.73_m2} (ref >59)
GFR, EST NON AFRICAN AMERICAN: 97 mL/min/{1.73_m2} (ref >59)
GLUCOSE: 80 mg/dL (ref 65–99)
Globulin, Total: 2.9 g/dL (ref 1.5–4.5)
Potassium: 4.9 mmol/L (ref 3.5–5.2)
Sodium: 141 mmol/L (ref 134–144)
TOTAL PROTEIN: 7.3 g/dL (ref 6.0–8.5)

## 2017-03-17 LAB — BAYER DCA HB A1C WAIVED: HB A1C (BAYER DCA - WAIVED): 9.1 % — ABNORMAL HIGH (ref <7.0)

## 2017-03-17 MED ORDER — BASAGLAR KWIKPEN 100 UNIT/ML ~~LOC~~ SOPN: 20.0000 [IU] | PEN_INJECTOR | Freq: Every day | SUBCUTANEOUS | 5 refills | Status: DC

## 2017-03-17 MED ORDER — ALPRAZOLAM 1 MG PO TABS: 1.0000 mg | ORAL_TABLET | Freq: Every evening | ORAL | 5 refills | Status: DC | PRN

## 2017-03-17 MED ORDER — BASAGLAR KWIKPEN 100 UNIT/ML ~~LOC~~ SOPN: 20.0000 [IU] | PEN_INJECTOR | Freq: Three times a day (TID) | SUBCUTANEOUS | 5 refills | Status: DC

## 2017-03-17 MED ORDER — LISINOPRIL 20 MG PO TABS: 20.0000 mg | ORAL_TABLET | Freq: Every day | ORAL | 3 refills | Status: DC

## 2017-03-17 MED ORDER — INSULIN LISPRO 100 UNIT/ML ~~LOC~~ SOLN: 100.0000 [IU] | Freq: Every day | SUBCUTANEOUS | 5 refills | Status: DC

## 2017-03-17 MED ORDER — INSULIN LISPRO 100 UNIT/ML ~~LOC~~ SOLN: 100.0000 [IU] | Freq: Three times a day (TID) | SUBCUTANEOUS | 5 refills | Status: DC

## 2017-03-17 NOTE — Patient Instructions (Signed)

## 2017-03-17 NOTE — Progress Notes (Signed)
BP (!) 183/93   Pulse 64   Temp 97.8 F (36.6 C) (Oral)   Ht 5' 9" (1.753 m)   Wt 193 lb 6.4 oz (87.7 kg)   BMI 28.56 kg/m    Subjective:    Patient ID: Charles Foley, male    DOB: August 06, 1961, 56 y.o.   MRN: [id number]  HPI: Charles Foley is a 56 y.o. male presenting on 03/17/2017 for Follow-up  This patient comes in for periodic recheck on medications and conditions including IDDM uncontrolled, elevated BP, anxiety, DJD multiple. Reports that he has had more joint and back pain especially form work and is doing more hours than usual.  Also doing a lot work for other people.  All medications are reviewed today. There are no reports of any problems with the medications. All of the medical conditions are reviewed and updated.  Lab work is reviewed and will be ordered as medically necessary. There are no new problems reported with today's visit.   Relevant past medical, surgical, family and social history reviewed and updated as indicated. Allergies and medications reviewed and updated.  Past Medical History:  Diagnosis Date  . Allergic rhinitis   . Anxiety   . Arthritis   . Cancer [facility]) 2012   prostate  . Chest pain   . Diabetes mellitus without complication [facility])     Past Surgical History:  Procedure Laterality Date  . ARTHROPLASTY Bilateral   . ORIF FACIAL FRACTURE  1988  . PROSTATECTOMY  2012    Review of Systems  Constitutional: Negative.  Negative for appetite change and fatigue.  HENT: Negative.   Eyes: Negative.  Negative for pain and visual disturbance.  Respiratory: Negative.  Negative for cough, chest tightness, shortness of breath and wheezing.   Cardiovascular: Negative.  Negative for chest pain, palpitations and leg swelling.  Gastrointestinal: Negative.  Negative for abdominal pain, diarrhea, nausea and vomiting.  Endocrine: Negative.   Genitourinary: Negative.   Musculoskeletal: Positive for arthralgias, back pain and myalgias.  Skin: Negative.  Negative  for color change and rash.  Neurological: Negative.  Negative for weakness, numbness and headaches.  Psychiatric/Behavioral: Negative.     Allergies as of 03/17/2017   No Known Allergies     Medication List       Accurate as of 03/17/17 10:41 PM. Always use your most recent med list.          acetaminophen 325 MG tablet Commonly known as:  TYLENOL Take 650 mg by mouth every 6 (six) hours as needed for pain.   ALPRAZolam 1 MG tablet Commonly known as:  XANAX Take 1 tablet (1 mg total) by mouth at bedtime as needed. for sleep   atorvastatin 10 MG tablet Commonly known as:  LIPITOR Take 1 tablet (10 mg total) by mouth daily.   BASAGLAR KWIKPEN 100 UNIT/ML Sopn Inject 0.2 mLs (20 Units total) into the skin daily.   Biotin 1000 MCG tablet Take 1,000 mcg by mouth 3 (three) times daily.   ibuprofen 200 MG tablet Commonly known as:  ADVIL,MOTRIN Take 200 mg by mouth every 6 (six) hours as needed for pain.   insulin lispro 100 UNIT/ML injection Commonly known as:  HUMALOG Inject 1 mL (100 Units total) into the skin 3 (three) times daily with meals.   levothyroxine 25 MCG tablet Commonly known as:  SYNTHROID, LEVOTHROID Take 1 tablet (25 mcg total) by mouth daily before breakfast.   lisinopril 20 MG tablet Commonly known as:  PRINIVIL,ZESTRIL Take  1 tablet (20 mg total) by mouth daily.   loratadine 10 MG tablet Commonly known as:  CLARITIN Take 10 mg by mouth daily.   meloxicam 7.5 MG tablet Commonly known as:  MOBIC TAKE 1 TABLET DAILY   multivitamin tablet Take 1 tablet by mouth daily.   pantoprazole 40 MG tablet Commonly known as:  PROTONIX Take 40 mg by mouth daily.          Objective:    BP (!) 183/93   Pulse 64   Temp 97.8 F (36.6 C) (Oral)   Ht 5' 9" (1.753 m)   Wt 193 lb 6.4 oz (87.7 kg)   BMI 28.56 kg/m   No Known Allergies  Physical Exam  Constitutional: He appears well-developed and well-nourished.  HENT:  Head: Normocephalic and  atraumatic.  Eyes: Conjunctivae and EOM are normal. Pupils are equal, round, and reactive to light.  Neck: Normal range of motion. Neck supple.  Cardiovascular: Normal rate, regular rhythm and normal heart sounds.   Pulmonary/Chest: Effort normal and breath sounds normal.  Abdominal: Soft. Bowel sounds are normal.  Musculoskeletal: Normal range of motion. He exhibits tenderness and deformity.  Skin: Skin is warm and dry.        Assessment & Plan:   1. Type 1 diabetes mellitus without complication (HCC) - XBM84+XLKG - Bayer DCA Hb A1c Waived - Insulin Glargine (BASAGLAR KWIKPEN) 100 UNIT/ML SOPN; Inject 0.2 mLs (20 Units total) into the skin daily.  Dispense: 45 mL; Refill: 5 - insulin lispro (HUMALOG) 100 UNIT/ML injection; Inject 1 mL (100 Units total) into the skin 3 (three) times daily with meals.  Dispense: 45 mL; Refill: 5  2. Other osteoarthritis involving multiple joints  3. Essential hypertension - lisinopril (PRINIVIL,ZESTRIL) 20 MG tablet; Take 1 tablet (20 mg total) by mouth daily.  Dispense: 90 tablet; Refill: 3   Current Outpatient Prescriptions:  .  acetaminophen (TYLENOL) 325 MG tablet, Take 650 mg by mouth every 6 (six) hours as needed for pain., Disp: , Rfl:  .  ALPRAZolam (XANAX) 1 MG tablet, Take 1 tablet (1 mg total) by mouth at bedtime as needed. for sleep, Disp: 30 tablet, Rfl: 5 .  atorvastatin (LIPITOR) 10 MG tablet, Take 1 tablet (10 mg total) by mouth daily., Disp: 30 tablet, Rfl: 1 .  Biotin 1000 MCG tablet, Take 1,000 mcg by mouth 3 (three) times daily., Disp: , Rfl:  .  ibuprofen (ADVIL,MOTRIN) 200 MG tablet, Take 200 mg by mouth every 6 (six) hours as needed for pain., Disp: , Rfl:  .  Insulin Glargine (BASAGLAR KWIKPEN) 100 UNIT/ML SOPN, Inject 0.2 mLs (20 Units total) into the skin daily., Disp: 45 mL, Rfl: 5 .  insulin lispro (HUMALOG) 100 UNIT/ML injection, Inject 1 mL (100 Units total) into the skin 3 (three) times daily with meals., Disp: 45 mL,  Rfl: 5 .  levothyroxine (SYNTHROID, LEVOTHROID) 25 MCG tablet, Take 1 tablet (25 mcg total) by mouth daily before breakfast., Disp: 30 tablet, Rfl: 6 .  loratadine (CLARITIN) 10 MG tablet, Take 10 mg by mouth daily., Disp: , Rfl:  .  meloxicam (MOBIC) 7.5 MG tablet, TAKE 1 TABLET DAILY, Disp: 30 tablet, Rfl: 1 .  Multiple Vitamin (MULTIVITAMIN) tablet, Take 1 tablet by mouth daily., Disp: , Rfl:  .  pantoprazole (PROTONIX) 40 MG tablet, Take 40 mg by mouth daily., Disp: , Rfl:  .  lisinopril (PRINIVIL,ZESTRIL) 20 MG tablet, Take 1 tablet (20 mg total) by mouth daily., Disp: 90 tablet,  Rfl: 3  Continue all other maintenance medications as listed above.  Follow up plan: Return in about 3 months (around 06/17/2017) for recheck.  Educational handout given for carb counting  Terald Sleeper PA-C [facility] [address]  [place], [place] [address] [phone]   03/17/2017, 10:41 PM

## 2017-03-20 ENCOUNTER — Other Ambulatory Visit: Payer: Self-pay | Admitting: Physician Assistant

## 2017-04-29 ENCOUNTER — Ambulatory Visit (INDEPENDENT_AMBULATORY_CARE_PROVIDER_SITE_OTHER): Payer: 59 | Admitting: Physician Assistant

## 2017-04-29 ENCOUNTER — Encounter: Payer: Self-pay | Admitting: Physician Assistant

## 2017-04-29 VITALS — BP 142/78 | HR 62 | Temp 97.0°F | Ht 69.0 in | Wt 192.6 lb

## 2017-04-29 DIAGNOSIS — M12811 Other specific arthropathies, not elsewhere classified, right shoulder: Secondary | ICD-10-CM | POA: Insufficient documentation

## 2017-04-29 DIAGNOSIS — M75101 Unspecified rotator cuff tear or rupture of right shoulder, not specified as traumatic: Principal | ICD-10-CM | POA: Insufficient documentation

## 2017-04-29 NOTE — Progress Notes (Signed)
BP (!) 142/78   Pulse 62   Temp (!) 97 F (36.1 C) (Oral)   Ht 5' 9"  (1.753 m)   Wt 192 lb 9.6 oz (87.4 kg)   BMI 28.44 kg/m    Subjective:    Patient ID: Charles Foley, male    DOB: [birth year], 57 y.o.   MRN: [id number]  HPI: Charles Foley is a 55 y.o. male presenting on 04/29/2017 for Shoulder Pain (right )  The patient comes in with a severe right shoulder pain. Is a 10 out of 10. 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 He works very long hours. He has been doing this for several years. In addition to stacking them he has to roll them a lot. There is a lot of shoulder use. He has been taking some ibuprofen with minimal relief at this time. I've encouraged him to use 400-800 mg at a time instead of just 200 mg. We'll plan orthopedic referral.  Relevant past medical, surgical, family and social history reviewed and updated as indicated. Allergies and medications reviewed and updated.  Past Medical History:  Diagnosis Date  . Allergic rhinitis   . Anxiety   . Arthritis   . Cancer [facility]) 2012   prostate  . Chest pain   . Diabetes mellitus without complication [facility])     Past Surgical History:  Procedure Laterality Date  . ARTHROPLASTY Bilateral   . ORIF FACIAL FRACTURE  1988  . PROSTATECTOMY  2012    Review of Systems  Constitutional: Negative.  Negative for appetite change and fatigue.  Eyes: Negative for pain and visual disturbance.  Respiratory: Negative.  Negative for cough, chest tightness, shortness of breath and wheezing.   Cardiovascular: Negative.  Negative for chest pain, palpitations and leg swelling.  Gastrointestinal: Negative.  Negative for abdominal pain, diarrhea, nausea and vomiting.  Genitourinary: Negative.   Musculoskeletal: Positive for arthralgias, joint swelling and myalgias.  Skin: Negative.  Negative for color change and rash.  Neurological: Negative.  Negative for weakness, numbness and headaches.    Psychiatric/Behavioral: Negative.     Allergies as of 04/29/2017   No Known Allergies     Medication List       Accurate as of 04/29/17  1:51 PM. Always use your most recent med list.          acetaminophen 325 MG tablet Commonly known as:  TYLENOL Take 650 mg by mouth every 6 (six) hours as needed for pain.   ALPRAZolam 1 MG tablet Commonly known as:  XANAX Take 1 tablet (1 mg total) by mouth at bedtime as needed. for sleep   atorvastatin 10 MG tablet Commonly known as:  LIPITOR TAKE 1 TABLET DAILY   BASAGLAR KWIKPEN 100 UNIT/ML Sopn Inject 0.2 mLs (20 Units total) into the skin daily.   Biotin 1000 MCG tablet Take 1,000 mcg by mouth 3 (three) times daily.   ibuprofen 200 MG tablet Commonly known as:  ADVIL,MOTRIN Take 200 mg by mouth every 6 (six) hours as needed for pain.   insulin lispro 100 UNIT/ML injection Commonly known as:  HUMALOG Inject 1 mL (100 Units total) into the skin 3 (three) times daily with meals.   levothyroxine 25 MCG tablet Commonly known as:  SYNTHROID, LEVOTHROID Take 1 tablet (25 mcg total) by mouth daily before breakfast.   lisinopril 20 MG tablet Commonly known as:  PRINIVIL,ZESTRIL Take 1 tablet (20 mg total) by mouth daily.   loratadine  10 MG tablet Commonly known as:  CLARITIN Take 10 mg by mouth daily.   multivitamin tablet Take 1 tablet by mouth daily.   pantoprazole 40 MG tablet Commonly known as:  PROTONIX Take 40 mg by mouth daily.          Objective:    BP (!) 142/78   Pulse 62   Temp (!) 97 F (36.1 C) (Oral)   Ht 5' 9"  (1.753 m)   Wt 192 lb 9.6 oz (87.4 kg)   BMI 28.44 kg/m   No Known Allergies  Physical Exam  Constitutional: He appears well-developed and well-nourished. No distress.  HENT:  Head: Normocephalic and atraumatic.  Eyes: Pupils are equal, round, and reactive to light. Conjunctivae and EOM are normal.  Cardiovascular: Normal rate, regular rhythm and normal heart sounds.    Pulmonary/Chest: Effort normal and breath sounds normal. No respiratory distress.  Musculoskeletal:       Right shoulder: He exhibits decreased range of motion, tenderness, crepitus, pain and spasm.       Arms: Pain with lifting arm to 90 degrees, in anteroflex and lateroflex.  Skin: Skin is warm and dry.  Psychiatric: He has a normal mood and affect. His behavior is normal.  Nursing note and vitals reviewed.   Results for orders placed or performed in visit on 03/17/17  CMP14+EGFR  Result Value Ref Range   Glucose 80 65 - 99 mg/dL   BUN 17 6 - 24 mg/dL   Creatinine, Ser 0.84 0.76 - 1.27 mg/dL   GFR calc non Af Amer 97 >59 mL/min/1.73   GFR calc Af Amer 112 >59 mL/min/1.73   BUN/Creatinine Ratio 20 9 - 20   Sodium 141 134 - 144 mmol/L   Potassium 4.9 3.5 - 5.2 mmol/L   Chloride 101 96 - 106 mmol/L   CO2 24 20 - 29 mmol/L   Calcium 9.6 8.7 - 10.2 mg/dL   Total Protein 7.3 6.0 - 8.5 g/dL   Albumin 4.4 3.5 - 5.5 g/dL   Globulin, Total 2.9 1.5 - 4.5 g/dL   Albumin/Globulin Ratio 1.5 1.2 - 2.2   Bilirubin Total 1.0 0.0 - 1.2 mg/dL   Alkaline Phosphatase 56 39 - 117 IU/L   AST 24 0 - 40 IU/L   ALT 27 0 - 44 IU/L  Bayer DCA Hb A1c Waived  Result Value Ref Range   Bayer DCA Hb A1c Waived 9.1 (H) <7.0 %      Assessment & Plan:   1. Rotator cuff tear arthropathy of right shoulder - Ambulatory referral to Orthopedic Surgery    Current Outpatient Prescriptions:  .  acetaminophen (TYLENOL) 325 MG tablet, Take 650 mg by mouth every 6 (six) hours as needed for pain., Disp: , Rfl:  .  ALPRAZolam (XANAX) 1 MG tablet, Take 1 tablet (1 mg total) by mouth at bedtime as needed. for sleep, Disp: 30 tablet, Rfl: 5 .  atorvastatin (LIPITOR) 10 MG tablet, TAKE 1 TABLET DAILY, Disp: 30 tablet, Rfl: 2 .  Biotin 1000 MCG tablet, Take 1,000 mcg by mouth 3 (three) times daily., Disp: , Rfl:  .  ibuprofen (ADVIL,MOTRIN) 200 MG tablet, Take 200 mg by mouth every 6 (six) hours as needed for pain.,  Disp: , Rfl:  .  Insulin Glargine (BASAGLAR KWIKPEN) 100 UNIT/ML SOPN, Inject 0.2 mLs (20 Units total) into the skin daily., Disp: 45 mL, Rfl: 5 .  insulin lispro (HUMALOG) 100 UNIT/ML injection, Inject 1 mL (100 Units total) into the  skin 3 (three) times daily with meals., Disp: 45 mL, Rfl: 5 .  levothyroxine (SYNTHROID, LEVOTHROID) 25 MCG tablet, Take 1 tablet (25 mcg total) by mouth daily before breakfast., Disp: 30 tablet, Rfl: 6 .  lisinopril (PRINIVIL,ZESTRIL) 20 MG tablet, Take 1 tablet (20 mg total) by mouth daily., Disp: 90 tablet, Rfl: 3 .  loratadine (CLARITIN) 10 MG tablet, Take 10 mg by mouth daily., Disp: , Rfl:  .  Multiple Vitamin (MULTIVITAMIN) tablet, Take 1 tablet by mouth daily., Disp: , Rfl:  .  pantoprazole (PROTONIX) 40 MG tablet, Take 40 mg by mouth daily., Disp: , Rfl:  Continue all other maintenance medications as listed above.  Follow up plan: Return if symptoms worsen or fail to improve.  Educational handout given for rotator cuff  Terald Sleeper PA-C [facility] [address]  [place], [place] [address] [phone]   04/29/2017, 1:51 PM

## 2017-04-29 NOTE — Patient Instructions (Signed)
In a few days you may receive a survey in the mail or online from [employer] regarding your visit with us today. Please take a moment to fill this out. Your feedback is very important to our whole office. It can help us better understand your needs as well as improve your experience and satisfaction. Thank you for taking your time to complete it. We care about you.  Jamani Eley, PA-C  

## 2017-05-04 DIAGNOSIS — M7501 Adhesive capsulitis of right shoulder: Secondary | ICD-10-CM | POA: Diagnosis not present

## 2017-06-17 ENCOUNTER — Encounter: Payer: Self-pay | Admitting: Physician Assistant

## 2017-06-17 ENCOUNTER — Ambulatory Visit (INDEPENDENT_AMBULATORY_CARE_PROVIDER_SITE_OTHER): Payer: 59 | Admitting: Physician Assistant

## 2017-06-17 VITALS — BP 153/79 | HR 62 | Ht 69.0 in | Wt 191.0 lb

## 2017-06-17 DIAGNOSIS — E109 Type 1 diabetes mellitus without complications: Secondary | ICD-10-CM | POA: Diagnosis not present

## 2017-06-17 DIAGNOSIS — I10 Essential (primary) hypertension: Secondary | ICD-10-CM

## 2017-06-17 DIAGNOSIS — M25561 Pain in right knee: Secondary | ICD-10-CM

## 2017-06-17 DIAGNOSIS — M12811 Other specific arthropathies, not elsewhere classified, right shoulder: Secondary | ICD-10-CM | POA: Diagnosis not present

## 2017-06-17 DIAGNOSIS — M75101 Unspecified rotator cuff tear or rupture of right shoulder, not specified as traumatic: Secondary | ICD-10-CM

## 2017-06-17 HISTORY — DX: Pain in right knee: M25.561

## 2017-06-17 LAB — BAYER DCA HB A1C WAIVED: HB A1C: 9.8 % — AB (ref <7.0)

## 2017-06-17 MED ORDER — DICLOFENAC SODIUM 75 MG PO TBEC: 75.0000 mg | DELAYED_RELEASE_TABLET | Freq: Two times a day (BID) | ORAL | 1 refills | Status: DC

## 2017-06-17 MED ORDER — ACETAMINOPHEN-CODEINE 300-30 MG PO TABS: 1.0000 | ORAL_TABLET | Freq: Four times a day (QID) | ORAL | 2 refills | Status: DC | PRN

## 2017-06-17 NOTE — Progress Notes (Signed)
BP (!) 153/79   Pulse 62   Ht 5' 9"  (1.753 m)   Wt 191 lb (86.6 kg)   BMI 28.21 kg/m    Subjective:    Patient ID: Charles Foley, male    DOB: 1961-06-06, 56 y.o.   MRN: [id number]  HPI: Charles Foley is a 56 y.o. male presenting on 06/17/2017 for Follow-up (3 month ) and Knee Pain (fell and it is swelling )  This patient comes in for periodic recheck on medications and conditions including Diabetes, hypertension. He saw an orthopedist for his rotator cuff injury. He states he is doing some better. He was out of work for about 2 weeks. He has been able to go back with some discomfort. Approximate 2 weeks ago he had a fall in his backyard where he twisted and landed on his right knee. He has had significant amount of pain in the lower portion of the joint space. He states he can feel clicking. There is swelling at times. It is worse at the end of the day after he has worked..   All medications are reviewed today. There are no reports of any problems with the medications. All of the medical conditions are reviewed and updated.  Lab work is reviewed and will be ordered as medically necessary. There are no new problems reported with today's visit.   Relevant past medical, surgical, family and social history reviewed and updated as indicated. Allergies and medications reviewed and updated.  Past Medical History:  Diagnosis Date  . Allergic rhinitis   . Anxiety   . Arthritis   . Cancer [facility]) 2012   prostate  . Chest pain   . Diabetes mellitus without complication [facility])     Past Surgical History:  Procedure Laterality Date  . ARTHROPLASTY Bilateral   . ORIF FACIAL FRACTURE  1988  . PROSTATECTOMY  2012    Review of Systems  Constitutional: Negative.  Negative for appetite change and fatigue.  HENT: Negative.   Eyes: Negative.  Negative for pain and visual disturbance.  Respiratory: Negative.  Negative for cough, chest tightness, shortness of breath and wheezing.   Cardiovascular:  Negative.  Negative for chest pain, palpitations and leg swelling.  Gastrointestinal: Negative.  Negative for abdominal pain, diarrhea, nausea and vomiting.  Endocrine: Negative.   Genitourinary: Negative.   Musculoskeletal: Negative.   Skin: Negative.  Negative for color change and rash.  Neurological: Negative.  Negative for weakness, numbness and headaches.  Psychiatric/Behavioral: Negative.     Allergies as of 06/17/2017   No Known Allergies     Medication List       Accurate as of 06/17/17 11:35 AM. Always use your most recent med list.          acetaminophen 325 MG tablet Commonly known as:  TYLENOL Take 650 mg by mouth every 6 (six) hours as needed for pain.   Acetaminophen-Codeine 300-30 MG tablet Commonly known as:  TYLENOL/CODEINE #3 Take 1-2 tablets by mouth every 6 (six) hours as needed for pain.   ALPRAZolam 1 MG tablet Commonly known as:  XANAX Take 1 tablet (1 mg total) by mouth at bedtime as needed. for sleep   atorvastatin 10 MG tablet Commonly known as:  LIPITOR TAKE 1 TABLET DAILY   BASAGLAR KWIKPEN 100 UNIT/ML Sopn Inject 0.2 mLs (20 Units total) into the skin daily.   Biotin 1000 MCG tablet Take 1,000 mcg by mouth 3 (three) times daily.   diclofenac 75 MG EC tablet Commonly  known as:  VOLTAREN Take 1 tablet (75 mg total) by mouth 2 (two) times daily.   ibuprofen 200 MG tablet Commonly known as:  ADVIL,MOTRIN Take 200 mg by mouth every 6 (six) hours as needed for pain.   insulin lispro 100 UNIT/ML injection Commonly known as:  HUMALOG Inject 1 mL (100 Units total) into the skin 3 (three) times daily with meals.   levothyroxine 25 MCG tablet Commonly known as:  SYNTHROID, LEVOTHROID Take 1 tablet (25 mcg total) by mouth daily before breakfast.   lisinopril 20 MG tablet Commonly known as:  PRINIVIL,ZESTRIL Take 1 tablet (20 mg total) by mouth daily.   loratadine 10 MG tablet Commonly known as:  CLARITIN Take 10 mg by mouth daily.     multivitamin tablet Take 1 tablet by mouth daily.   pantoprazole 40 MG tablet Commonly known as:  PROTONIX Take 40 mg by mouth daily.            Discharge Care Instructions        Start     Ordered   06/17/17 0000  CBC with Differential/Platelet     06/17/17 0830   06/17/17 0000  CMP14+EGFR     06/17/17 0830   06/17/17 0000  Lipid panel     06/17/17 0830   06/17/17 0000  Bayer DCA Hb A1c Waived     06/17/17 0830   06/17/17 0000  diclofenac (VOLTAREN) 75 MG EC tablet  2 times daily    Question:  Supervising Provider  Answer:  Kenn File L   06/17/17 0830   06/17/17 0000  Acetaminophen-Codeine (TYLENOL/CODEINE #3) 300-30 MG tablet  Every 6 hours PRN    Question:  Supervising Provider  Answer:  Timmothy Euler   06/17/17 0830         Objective:    BP (!) 153/79   Pulse 62   Ht 5' 9"  (1.753 m)   Wt 191 lb (86.6 kg)   BMI 28.21 kg/m   No Known Allergies  Physical Exam  Constitutional: He appears well-developed and well-nourished. No distress.  HENT:  Head: Normocephalic and atraumatic.  Eyes: Pupils are equal, round, and reactive to light. Conjunctivae and EOM are normal.  Cardiovascular: Normal rate, regular rhythm and normal heart sounds.   Pulmonary/Chest: Effort normal and breath sounds normal. No respiratory distress.  Musculoskeletal:       Right knee: He exhibits decreased range of motion and swelling. He exhibits no deformity. Tenderness found. Medial joint line and lateral joint line tenderness noted.       Legs: Skin: Skin is warm and dry.  Psychiatric: He has a normal mood and affect. His behavior is normal.  Nursing note and vitals reviewed.   Results for orders placed or performed in visit on 03/17/17  CMP14+EGFR  Result Value Ref Range   Glucose 80 65 - 99 mg/dL   BUN 17 6 - 24 mg/dL   Creatinine, Ser 0.84 0.76 - 1.27 mg/dL   GFR calc non Af Amer 97 >59 mL/min/1.73   GFR calc Af Amer 112 >59 mL/min/1.73   BUN/Creatinine Ratio 20  9 - 20   Sodium 141 134 - 144 mmol/L   Potassium 4.9 3.5 - 5.2 mmol/L   Chloride 101 96 - 106 mmol/L   CO2 24 20 - 29 mmol/L   Calcium 9.6 8.7 - 10.2 mg/dL   Total Protein 7.3 6.0 - 8.5 g/dL   Albumin 4.4 3.5 - 5.5 g/dL   Globulin, Total  2.9 1.5 - 4.5 g/dL   Albumin/Globulin Ratio 1.5 1.2 - 2.2   Bilirubin Total 1.0 0.0 - 1.2 mg/dL   Alkaline Phosphatase 56 39 - 117 IU/L   AST 24 0 - 40 IU/L   ALT 27 0 - 44 IU/L  Bayer DCA Hb A1c Waived  Result Value Ref Range   Bayer DCA Hb A1c Waived 9.1 (H) <7.0 %      Assessment & Plan:   1. Essential hypertension - CBC with Differential/Platelet - CMP14+EGFR - Lipid panel - Bayer DCA Hb A1c Waived  2. Type 1 diabetes mellitus without complication (HCC) - CBC with Differential/Platelet - CMP14+EGFR - Lipid panel - Bayer DCA Hb A1c Waived  3. Rotator cuff tear arthropathy of right shoulder  4. Acute pain of right knee - diclofenac (VOLTAREN) 75 MG EC tablet; Take 1 tablet (75 mg total) by mouth 2 (two) times daily.  Dispense: 60 tablet; Refill: 1 - Acetaminophen-Codeine (TYLENOL/CODEINE #3) 300-30 MG tablet; Take 1-2 tablets by mouth every 6 (six) hours as needed for pain.  Dispense: 40 tablet; Refill: 2    Current Outpatient Prescriptions:  .  acetaminophen (TYLENOL) 325 MG tablet, Take 650 mg by mouth every 6 (six) hours as needed for pain., Disp: , Rfl:  .  ALPRAZolam (XANAX) 1 MG tablet, Take 1 tablet (1 mg total) by mouth at bedtime as needed. for sleep, Disp: 30 tablet, Rfl: 5 .  atorvastatin (LIPITOR) 10 MG tablet, TAKE 1 TABLET DAILY, Disp: 30 tablet, Rfl: 2 .  Biotin 1000 MCG tablet, Take 1,000 mcg by mouth 3 (three) times daily., Disp: , Rfl:  .  ibuprofen (ADVIL,MOTRIN) 200 MG tablet, Take 200 mg by mouth every 6 (six) hours as needed for pain., Disp: , Rfl:  .  Insulin Glargine (BASAGLAR KWIKPEN) 100 UNIT/ML SOPN, Inject 0.2 mLs (20 Units total) into the skin daily., Disp: 45 mL, Rfl: 5 .  insulin lispro (HUMALOG) 100  UNIT/ML injection, Inject 1 mL (100 Units total) into the skin 3 (three) times daily with meals., Disp: 45 mL, Rfl: 5 .  levothyroxine (SYNTHROID, LEVOTHROID) 25 MCG tablet, Take 1 tablet (25 mcg total) by mouth daily before breakfast., Disp: 30 tablet, Rfl: 6 .  lisinopril (PRINIVIL,ZESTRIL) 20 MG tablet, Take 1 tablet (20 mg total) by mouth daily., Disp: 90 tablet, Rfl: 3 .  loratadine (CLARITIN) 10 MG tablet, Take 10 mg by mouth daily., Disp: , Rfl:  .  Multiple Vitamin (MULTIVITAMIN) tablet, Take 1 tablet by mouth daily., Disp: , Rfl:  .  pantoprazole (PROTONIX) 40 MG tablet, Take 40 mg by mouth daily., Disp: , Rfl:  .  Acetaminophen-Codeine (TYLENOL/CODEINE #3) 300-30 MG tablet, Take 1-2 tablets by mouth every 6 (six) hours as needed for pain., Disp: 40 tablet, Rfl: 2 .  diclofenac (VOLTAREN) 75 MG EC tablet, Take 1 tablet (75 mg total) by mouth 2 (two) times daily., Disp: 60 tablet, Rfl: 1 Continue all other maintenance medications as listed above.  Follow up plan: Return in about 3 months (around 09/16/2017) for recheck.  Educational handout given for [place] PA-C [facility] [address]  [place], [place] [address] [phone]   06/17/2017, 11:35 AM

## 2017-06-17 NOTE — Patient Instructions (Signed)
In a few days you may receive a survey in the mail or online from [employer] regarding your visit with us today. Please take a moment to fill this out. Your feedback is very important to our whole office. It can help us better understand your needs as well as improve your experience and satisfaction. Thank you for taking your time to complete it. We care about you.  Oliviagrace Crisanti, PA-C  

## 2017-06-18 LAB — CMP14+EGFR
A/G RATIO: 2 (ref 1.2–2.2)
ALK PHOS: 66 IU/L (ref 39–117)
ALT: 24 IU/L (ref 0–44)
AST: 22 IU/L (ref 0–40)
Albumin: 4.6 g/dL (ref 3.5–5.5)
BUN/Creatinine Ratio: 24 — ABNORMAL HIGH (ref 9–20)
BUN: 20 mg/dL (ref 6–24)
Bilirubin Total: 0.7 mg/dL (ref 0.0–1.2)
CALCIUM: 9.7 mg/dL (ref 8.7–10.2)
CO2: 25 mmol/L (ref 20–29)
CREATININE: 0.84 mg/dL (ref 0.76–1.27)
Chloride: 101 mmol/L (ref 96–106)
GFR, EST AFRICAN AMERICAN: 112 mL/min/{1.73_m2} (ref >59)
GFR, EST NON AFRICAN AMERICAN: 97 mL/min/{1.73_m2} (ref >59)
Globulin, Total: 2.3 g/dL (ref 1.5–4.5)
Glucose: 68 mg/dL (ref 65–99)
Potassium: 4.2 mmol/L (ref 3.5–5.2)
Sodium: 139 mmol/L (ref 134–144)
Total Protein: 6.9 g/dL (ref 6.0–8.5)

## 2017-06-18 LAB — CBC WITH DIFFERENTIAL/PLATELET
BASOS ABS: 0 10*3/uL (ref 0.0–0.2)
Basos: 0 %
EOS (ABSOLUTE): 0.1 10*3/uL (ref 0.0–0.4)
EOS: 1 %
HEMATOCRIT: 41 % (ref 37.5–51.0)
Hemoglobin: 14.2 g/dL (ref 13.0–17.7)
IMMATURE GRANULOCYTES: 0 %
Immature Grans (Abs): 0 10*3/uL (ref 0.0–0.1)
LYMPHS ABS: 3 10*3/uL (ref 0.7–3.1)
Lymphs: 36 %
MCH: 31 pg (ref 26.6–33.0)
MCHC: 34.6 g/dL (ref 31.5–35.7)
MCV: 90 fL (ref 79–97)
MONOCYTES: 7 %
MONOS ABS: 0.6 10*3/uL (ref 0.1–0.9)
NEUTROS PCT: 56 %
Neutrophils Absolute: 4.5 10*3/uL (ref 1.4–7.0)
PLATELETS: 325 10*3/uL (ref 150–379)
RBC: 4.58 x10E6/uL (ref 4.14–5.80)
RDW: 13.5 % (ref 12.3–15.4)
WBC: 8.2 10*3/uL (ref 3.4–10.8)

## 2017-06-18 LAB — LIPID PANEL
CHOL/HDL RATIO: 2.1 ratio (ref 0.0–5.0)
Cholesterol, Total: 132 mg/dL (ref 100–199)
HDL: 63 mg/dL (ref >39)
LDL CALC: 60 mg/dL (ref 0–99)
TRIGLYCERIDES: 45 mg/dL (ref 0–149)
VLDL CHOLESTEROL CAL: 9 mg/dL (ref 5–40)

## 2017-06-20 ENCOUNTER — Other Ambulatory Visit: Payer: Self-pay | Admitting: Physician Assistant

## 2017-06-29 ENCOUNTER — Other Ambulatory Visit: Payer: Self-pay | Admitting: Nurse Practitioner

## 2017-07-24 ENCOUNTER — Other Ambulatory Visit: Payer: Self-pay | Admitting: Physician Assistant

## 2017-07-24 NOTE — Telephone Encounter (Signed)
Last thyroid 08/12/16  Glenard Haring

## 2017-08-20 ENCOUNTER — Other Ambulatory Visit: Payer: Self-pay | Admitting: Physician Assistant

## 2017-08-20 NOTE — Telephone Encounter (Signed)
OV 09/24/17

## 2017-08-21 ENCOUNTER — Other Ambulatory Visit: Payer: Self-pay | Admitting: Physician Assistant

## 2017-08-21 DIAGNOSIS — M25561 Pain in right knee: Secondary | ICD-10-CM

## 2017-09-16 ENCOUNTER — Other Ambulatory Visit: Payer: Self-pay | Admitting: Physician Assistant

## 2017-09-16 NOTE — Telephone Encounter (Signed)
Last thyroid 08/12/16 

## 2017-09-21 ENCOUNTER — Other Ambulatory Visit: Payer: Self-pay | Admitting: Physician Assistant

## 2017-09-24 ENCOUNTER — Ambulatory Visit: Payer: 59 | Admitting: Physician Assistant

## 2017-09-24 ENCOUNTER — Encounter: Payer: Self-pay | Admitting: Physician Assistant

## 2017-09-24 VITALS — BP 153/79 | HR 63 | Temp 97.5°F | Ht 69.0 in | Wt 195.2 lb

## 2017-09-24 DIAGNOSIS — Z0000 Encounter for general adult medical examination without abnormal findings: Secondary | ICD-10-CM | POA: Diagnosis not present

## 2017-09-24 DIAGNOSIS — J0110 Acute frontal sinusitis, unspecified: Secondary | ICD-10-CM

## 2017-09-24 DIAGNOSIS — E109 Type 1 diabetes mellitus without complications: Secondary | ICD-10-CM

## 2017-09-24 DIAGNOSIS — I10 Essential (primary) hypertension: Secondary | ICD-10-CM

## 2017-09-24 LAB — BAYER DCA HB A1C WAIVED: HB A1C (BAYER DCA - WAIVED): 11.2 % — ABNORMAL HIGH (ref <7.0)

## 2017-09-24 MED ORDER — MELOXICAM 7.5 MG PO TABS: 7.5000 mg | ORAL_TABLET | Freq: Every day | ORAL | 5 refills | Status: DC

## 2017-09-24 MED ORDER — ALPRAZOLAM 1 MG PO TABS: 1.0000 mg | ORAL_TABLET | Freq: Every evening | ORAL | 5 refills | Status: DC | PRN

## 2017-09-24 MED ORDER — AMOXICILLIN-POT CLAVULANATE 875-125 MG PO TABS: 1.0000 | ORAL_TABLET | Freq: Two times a day (BID) | ORAL | 0 refills | Status: DC

## 2017-09-24 NOTE — Progress Notes (Signed)
BP (!) 153/79   Pulse 63   Temp (!) 97.5 F (36.4 C) (Oral)   Ht 5' 9" (1.753 m)   Wt 195 lb 3.2 oz (88.5 kg)   BMI 28.83 kg/m    Subjective:    Patient ID: Charles Foley, male    DOB: 1961-03-21, 57 y.o.   MRN: [id number]  HPI: Charles Foley is a 57 y.o. male presenting on 09/24/2017 for Follow-up (3 month ); Hypertension; Diabetes; Sinusitis; and Headache  Here for a recheck on medications and labs. Reports that he is doing okay overall. A lot of stress related to close people dying. This patient has had many days of sinus headache and postnasal drainage. There is copious drainage at times. Denies any fever at this time. There has been a history of sinus infections in the past.  No history of sinus surgery. There is cough at night. It has become more prevalent in recent days.   Relevant past medical, surgical, family and social history reviewed and updated as indicated. Allergies and medications reviewed and updated.  Past Medical History:  Diagnosis Date  . Allergic rhinitis   . Anxiety   . Arthritis   . Cancer [facility]) 2012   prostate  . Chest pain   . Diabetes mellitus without complication [facility])     Past Surgical History:  Procedure Laterality Date  . ARTHROPLASTY Bilateral   . ORIF FACIAL FRACTURE  1988  . PROSTATECTOMY  2012    Review of Systems  Constitutional: Positive for fatigue and fever. Negative for appetite change.  HENT: Positive for congestion, sinus pressure, sinus pain and sore throat.   Eyes: Negative.  Negative for pain and visual disturbance.  Respiratory: Positive for cough. Negative for chest tightness, shortness of breath and wheezing.   Cardiovascular: Negative.  Negative for chest pain, palpitations and leg swelling.  Gastrointestinal: Negative.  Negative for abdominal pain, diarrhea, nausea and vomiting.  Endocrine: Negative.   Genitourinary: Negative.   Musculoskeletal: Positive for back pain and myalgias.  Skin: Negative.  Negative for color  change and rash.  Neurological: Positive for headaches. Negative for weakness and numbness.  Psychiatric/Behavioral: Negative.     Allergies as of 09/24/2017   No Known Allergies     Medication List        Accurate as of 09/24/17  8:30 AM. Always use your most recent med list.          acetaminophen 325 MG tablet Commonly known as:  TYLENOL Take 650 mg by mouth every 6 (six) hours as needed for pain.   Acetaminophen-Codeine 300-30 MG tablet Commonly known as:  TYLENOL/CODEINE #3 Take 1-2 tablets by mouth every 6 (six) hours as needed for pain.   ALPRAZolam 1 MG tablet Commonly known as:  XANAX Take 1 tablet (1 mg total) by mouth at bedtime as needed. for sleep   amoxicillin-clavulanate 875-125 MG tablet Commonly known as:  AUGMENTIN Take 1 tablet by mouth 2 (two) times daily.   atorvastatin 10 MG tablet Commonly known as:  LIPITOR TAKE 1 TABLET DAILY   BASAGLAR KWIKPEN 100 UNIT/ML Sopn Inject 0.2 mLs (20 Units total) into the skin daily.   Biotin 1000 MCG tablet Take 1,000 mcg by mouth 3 (three) times daily.   ibuprofen 200 MG tablet Commonly known as:  ADVIL,MOTRIN Take 200 mg by mouth every 6 (six) hours as needed for pain.   insulin lispro 100 UNIT/ML injection Commonly known as:  HUMALOG Inject 1 mL (100 Units total)  into the skin 3 (three) times daily with meals.   levothyroxine 25 MCG tablet Commonly known as:  SYNTHROID, LEVOTHROID TAKE (1) TABLET DAILY BEFORE BREAKFAST.   lisinopril 20 MG tablet Commonly known as:  PRINIVIL,ZESTRIL Take 1 tablet (20 mg total) by mouth daily.   loratadine 10 MG tablet Commonly known as:  CLARITIN Take 10 mg by mouth daily.   meloxicam 7.5 MG tablet Commonly known as:  MOBIC Take 1 tablet (7.5 mg total) by mouth daily.   multivitamin tablet Take 1 tablet by mouth daily.   pantoprazole 40 MG tablet Commonly known as:  PROTONIX Take 40 mg by mouth daily.          Objective:    BP (!) 153/79   Pulse 63    Temp (!) 97.5 F (36.4 C) (Oral)   Ht 5' 9" (1.753 m)   Wt 195 lb 3.2 oz (88.5 kg)   BMI 28.83 kg/m   No Known Allergies  Physical Exam  Constitutional: He is oriented to person, place, and time. He appears well-developed and well-nourished.  HENT:  Head: Normocephalic and atraumatic.  Right Ear: Tympanic membrane and external ear normal. No middle ear effusion.  Left Ear: Tympanic membrane and external ear normal.  No middle ear effusion.  Nose: Mucosal edema and rhinorrhea present. Right sinus exhibits no maxillary sinus tenderness. Left sinus exhibits no maxillary sinus tenderness.  Mouth/Throat: Uvula is midline. Posterior oropharyngeal erythema present.  Eyes: Conjunctivae and EOM are normal. Pupils are equal, round, and reactive to light. Right eye exhibits no discharge. Left eye exhibits no discharge.  Neck: Normal range of motion.  Cardiovascular: Normal rate, regular rhythm and normal heart sounds.  Pulmonary/Chest: Effort normal and breath sounds normal. No respiratory distress. He has no wheezes.  Abdominal: Soft.  Lymphadenopathy:    He has no cervical adenopathy.  Neurological: He is alert and oriented to person, place, and time.  Skin: Skin is warm and dry.  Psychiatric: He has a normal mood and affect.  Nursing note and vitals reviewed.       Assessment & Plan:   1. Type 1 diabetes mellitus without complication ([place])  2. Essential hypertension  3. Well adult exam - CBC with Differential/Platelet - CMP14+EGFR - Lipid panel - Bayer DCA Hb A1c Waived - PSA  4. Acute non-recurrent frontal sinusitis - amoxicillin-clavulanate (AUGMENTIN) 875-125 MG tablet; Take 1 tablet by mouth 2 (two) times daily.  Dispense: 20 tablet; Refill: 0     Current Outpatient Medications:  .  acetaminophen (TYLENOL) 325 MG tablet, Take 650 mg by mouth every 6 (six) hours as needed for pain., Disp: , Rfl:  .  Acetaminophen-Codeine (TYLENOL/CODEINE #3) 300-30 MG tablet, Take 1-2  tablets by mouth every 6 (six) hours as needed for pain., Disp: 40 tablet, Rfl: 2 .  atorvastatin (LIPITOR) 10 MG tablet, TAKE 1 TABLET DAILY, Disp: 90 tablet, Rfl: 1 .  Biotin 1000 MCG tablet, Take 1,000 mcg by mouth 3 (three) times daily., Disp: , Rfl:  .  ibuprofen (ADVIL,MOTRIN) 200 MG tablet, Take 200 mg by mouth every 6 (six) hours as needed for pain., Disp: , Rfl:  .  Insulin Glargine (BASAGLAR KWIKPEN) 100 UNIT/ML SOPN, Inject 0.2 mLs (20 Units total) into the skin daily., Disp: 45 mL, Rfl: 5 .  insulin lispro (HUMALOG) 100 UNIT/ML injection, Inject 1 mL (100 Units total) into the skin 3 (three) times daily with meals., Disp: 45 mL, Rfl: 5 .  levothyroxine (SYNTHROID, LEVOTHROID) 25  MCG tablet, TAKE (1) TABLET DAILY BEFORE BREAKFAST., Disp: 30 tablet, Rfl: 0 .  lisinopril (PRINIVIL,ZESTRIL) 20 MG tablet, Take 1 tablet (20 mg total) by mouth daily., Disp: 90 tablet, Rfl: 3 .  loratadine (CLARITIN) 10 MG tablet, Take 10 mg by mouth daily., Disp: , Rfl:  .  Multiple Vitamin (MULTIVITAMIN) tablet, Take 1 tablet by mouth daily., Disp: , Rfl:  .  pantoprazole (PROTONIX) 40 MG tablet, Take 40 mg by mouth daily., Disp: , Rfl:  .  ALPRAZolam (XANAX) 1 MG tablet, Take 1 tablet (1 mg total) by mouth at bedtime as needed. for sleep, Disp: 30 tablet, Rfl: 5 .  amoxicillin-clavulanate (AUGMENTIN) 875-125 MG tablet, Take 1 tablet by mouth 2 (two) times daily., Disp: 20 tablet, Rfl: 0 .  meloxicam (MOBIC) 7.5 MG tablet, Take 1 tablet (7.5 mg total) by mouth daily., Disp: 30 tablet, Rfl: 5 Continue all other maintenance medications as listed above.  Follow up plan: Return in about 3 months (around 12/23/2017).  Terald Sleeper PA-C [facility] [address]  [place], [place] [address] [phone]   09/24/2017, 8:30 AM

## 2017-09-24 NOTE — Patient Instructions (Signed)
In a few days you may receive a survey in the mail or online from [employer] regarding your visit with us today. Please take a moment to fill this out. Your feedback is very important to our whole office. It can help us better understand your needs as well as improve your experience and satisfaction. Thank you for taking your time to complete it. We care about you.  Catherin Doorn, PA-C  

## 2017-09-25 LAB — CMP14+EGFR
A/G RATIO: 1.6 (ref 1.2–2.2)
ALT: 25 IU/L (ref 0–44)
AST: 22 IU/L (ref 0–40)
Albumin: 4.6 g/dL (ref 3.5–5.5)
Alkaline Phosphatase: 64 IU/L (ref 39–117)
BUN/Creatinine Ratio: 22 — ABNORMAL HIGH (ref 9–20)
BUN: 19 mg/dL (ref 6–24)
Bilirubin Total: 1 mg/dL (ref 0.0–1.2)
CALCIUM: 10.2 mg/dL (ref 8.7–10.2)
CO2: 19 mmol/L — ABNORMAL LOW (ref 20–29)
CREATININE: 0.88 mg/dL (ref 0.76–1.27)
Chloride: 101 mmol/L (ref 96–106)
GFR calc Af Amer: 109 mL/min/{1.73_m2} (ref >59)
GFR, EST NON AFRICAN AMERICAN: 95 mL/min/{1.73_m2} (ref >59)
Globulin, Total: 2.9 g/dL (ref 1.5–4.5)
Glucose: 97 mg/dL (ref 65–99)
POTASSIUM: 4.8 mmol/L (ref 3.5–5.2)
Sodium: 139 mmol/L (ref 134–144)
TOTAL PROTEIN: 7.5 g/dL (ref 6.0–8.5)

## 2017-09-25 LAB — CBC WITH DIFFERENTIAL/PLATELET
BASOS: 0 %
Basophils Absolute: 0 10*3/uL (ref 0.0–0.2)
EOS (ABSOLUTE): 0.1 10*3/uL (ref 0.0–0.4)
EOS: 1 %
Hematocrit: 40.9 % (ref 37.5–51.0)
Hemoglobin: 14 g/dL (ref 13.0–17.7)
IMMATURE GRANS (ABS): 0 10*3/uL (ref 0.0–0.1)
IMMATURE GRANULOCYTES: 0 %
LYMPHS: 32 %
Lymphocytes Absolute: 2.7 10*3/uL (ref 0.7–3.1)
MCH: 30.6 pg (ref 26.6–33.0)
MCHC: 34.2 g/dL (ref 31.5–35.7)
MCV: 89 fL (ref 79–97)
Monocytes Absolute: 0.5 10*3/uL (ref 0.1–0.9)
Monocytes: 6 %
NEUTROS PCT: 61 %
Neutrophils Absolute: 5 10*3/uL (ref 1.4–7.0)
PLATELETS: 282 10*3/uL (ref 150–379)
RBC: 4.58 x10E6/uL (ref 4.14–5.80)
RDW: 12.9 % (ref 12.3–15.4)
WBC: 8.5 10*3/uL (ref 3.4–10.8)

## 2017-09-25 LAB — LIPID PANEL
CHOL/HDL RATIO: 2.4 ratio (ref 0.0–5.0)
Cholesterol, Total: 144 mg/dL (ref 100–199)
HDL: 61 mg/dL (ref >39)
LDL CALC: 71 mg/dL (ref 0–99)
Triglycerides: 60 mg/dL (ref 0–149)
VLDL CHOLESTEROL CAL: 12 mg/dL (ref 5–40)

## 2017-09-25 LAB — PSA: Prostate Specific Ag, Serum: <0.1 ng/mL (ref 0.0–4.0)

## 2017-10-26 ENCOUNTER — Other Ambulatory Visit: Payer: Self-pay | Admitting: Physician Assistant

## 2017-10-26 DIAGNOSIS — J0110 Acute frontal sinusitis, unspecified: Secondary | ICD-10-CM

## 2017-11-05 ENCOUNTER — Other Ambulatory Visit: Payer: Self-pay | Admitting: Physician Assistant

## 2017-11-30 DIAGNOSIS — M25561 Pain in right knee: Secondary | ICD-10-CM | POA: Diagnosis not present

## 2017-11-30 DIAGNOSIS — M1711 Unilateral primary osteoarthritis, right knee: Secondary | ICD-10-CM | POA: Diagnosis not present

## 2017-12-15 ENCOUNTER — Other Ambulatory Visit: Payer: Self-pay | Admitting: Physician Assistant

## 2017-12-15 DIAGNOSIS — E109 Type 1 diabetes mellitus without complications: Secondary | ICD-10-CM

## 2017-12-28 ENCOUNTER — Encounter: Payer: Self-pay | Admitting: Physician Assistant

## 2017-12-28 ENCOUNTER — Ambulatory Visit: Payer: 59 | Admitting: Physician Assistant

## 2017-12-28 VITALS — BP 127/75 | HR 72 | Ht 69.0 in | Wt 196.8 lb

## 2017-12-28 DIAGNOSIS — I10 Essential (primary) hypertension: Secondary | ICD-10-CM

## 2017-12-28 DIAGNOSIS — E109 Type 1 diabetes mellitus without complications: Secondary | ICD-10-CM | POA: Diagnosis not present

## 2017-12-28 LAB — BAYER DCA HB A1C WAIVED: HB A1C (BAYER DCA - WAIVED): 10.4 % — ABNORMAL HIGH (ref <7.0)

## 2017-12-28 NOTE — Progress Notes (Signed)
BP 127/75   Pulse 72   Ht 5' 9"  (1.753 m)   Wt 196 lb 12.8 oz (89.3 kg)   BMI 29.06 kg/m    Subjective:    Patient ID: Charles Foley, male    DOB: [birth year], 57 y.o.   MRN: [id number]  HPI: Charles Foley is a 57 y.o. male presenting on 12/28/2017 for Follow-up (3 month )  Patient comes in for 26-monthrecheck on his conditions.  He does have controlled hypertension.  He states that his diabetes has been still somewhat uncontrolled.  He is up to a total of 110 units daily of his Lantus.  We have discussed trying to split it and he has been to try to do that the for the next 3 months.  We will check lab work today.  He is still having some difficulties with his right shoulder and his right knee.  He has known degeneration and arthropathies of these joints.  Past Medical History:  Diagnosis Date  . Allergic rhinitis   . Anxiety   . Arthritis   . Cancer ([facility] 2012   prostate  . Chest pain   . [place]    Relevant past medical, surgical, family and social history reviewed and updated as indicated. Interim medical history since our last visit reviewed. Allergies and medications reviewed and updated. DATA REVIEWED: CHART IN EPIC  Family History reviewed for pertinent findings.  Review of Systems  Constitutional: Negative.  Negative for appetite change and fatigue.  HENT: Negative.   Eyes: Negative.  Negative for pain and visual disturbance.  Respiratory: Negative.  Negative for cough, chest tightness, shortness of breath and wheezing.   Cardiovascular: Negative.  Negative for chest pain, palpitations and leg swelling.  Gastrointestinal: Negative.  Negative for abdominal pain, diarrhea, nausea and vomiting.  Endocrine: Negative.   Genitourinary: Negative.   Musculoskeletal: Positive for arthralgias, back pain and myalgias.  Skin: Negative.  Negative for color change and rash.  Neurological: Negative.  Negative for weakness, numbness and  headaches.  Psychiatric/Behavioral: Negative.     Allergies as of 12/28/2017   No Known Allergies     Medication List        Accurate as of 12/28/17  1:20 PM. Always use your most recent med list.          acetaminophen 325 MG tablet Commonly known as:  TYLENOL Take 650 mg by mouth every 6 (six) hours as needed for pain.   Acetaminophen-Codeine 300-30 MG tablet Commonly known as:  TYLENOL/CODEINE #3 Take 1-2 tablets by mouth every 6 (six) hours as needed for pain.   ALPRAZolam 1 MG tablet Commonly known as:  XANAX Take 1 tablet (1 mg total) by mouth at bedtime as needed. for sleep   atorvastatin 10 MG tablet Commonly known as:  LIPITOR TAKE 1 TABLET DAILY   BASAGLAR KWIKPEN 100 UNIT/ML Sopn USE AS DIRECTED UP TO 150 UNITS A DAY   Biotin 1000 MCG tablet Take 1,000 mcg by mouth 3 (three) times daily.   ibuprofen 200 MG tablet Commonly known as:  ADVIL,MOTRIN Take 200 mg by mouth every 6 (six) hours as needed for pain.   insulin lispro 100 UNIT/ML injection Commonly known as:  HUMALOG Inject 1 mL (100 Units total) into the skin 3 (three) times daily with meals.   levothyroxine 25 MCG tablet Commonly known as:  SYNTHROID, LEVOTHROID TAKE (1) TABLET DAILY BEFORE BREAKFAST.   lisinopril 20 MG tablet Commonly known  as:  PRINIVIL,ZESTRIL Take 1 tablet (20 mg total) by mouth daily.   loratadine 10 MG tablet Commonly known as:  CLARITIN Take 10 mg by mouth daily.   meloxicam 7.5 MG tablet Commonly known as:  MOBIC Take 1 tablet (7.5 mg total) by mouth daily.   multivitamin tablet Take 1 tablet by mouth daily.   pantoprazole 40 MG tablet Commonly known as:  PROTONIX Take 40 mg by mouth daily.          Objective:    BP 127/75   Pulse 72   Ht 5' 9"  (1.753 m)   Wt 196 lb 12.8 oz (89.3 kg)   BMI 29.06 kg/m   No Known Allergies  Wt Readings from Last 3 Encounters:  12/28/17 196 lb 12.8 oz (89.3 kg)  09/24/17 195 lb 3.2 oz (88.5 kg)  06/17/17 191 lb  (86.6 kg)    Physical Exam  Constitutional: He appears well-developed and well-nourished. No distress.  HENT:  Head: Normocephalic and atraumatic.  Eyes: Pupils are equal, round, and reactive to light. Conjunctivae and EOM are normal.  Cardiovascular: Normal rate, regular rhythm and normal heart sounds.  Pulmonary/Chest: Effort normal and breath sounds normal. No respiratory distress.  Skin: Skin is warm and dry.  Psychiatric: He has a normal mood and affect. His behavior is normal.  Nursing note and vitals reviewed.   Results for orders placed or performed in visit on 12/28/17  Bayer DCA Hb A1c Waived  Result Value Ref Range   Bayer DCA Hb A1c Waived 10.4 (H) <7.0 %      Assessment & Plan:   1. Essential hypertension  2. Type 1 diabetes mellitus without complication (HCC) - EMV36+PQAE - Bayer DCA Hb A1c Waived   Continue all other maintenance medications as listed above.  Follow up plan: Return in about 3 months (around 03/29/2018) for recheck.  Educational handout given for Saks PA-C [facility] [address]  [place], [place] [address] [phone]   12/28/2017, 1:20 PM

## 2017-12-28 NOTE — Patient Instructions (Signed)
[facility]

## 2017-12-29 LAB — CMP14+EGFR
ALBUMIN: 4.5 g/dL (ref 3.5–5.5)
ALK PHOS: 73 IU/L (ref 39–117)
ALT: 29 IU/L (ref 0–44)
AST: 20 IU/L (ref 0–40)
Albumin/Globulin Ratio: 1.5 (ref 1.2–2.2)
BUN/Creatinine Ratio: 24 — ABNORMAL HIGH (ref 9–20)
BUN: 21 mg/dL (ref 6–24)
Bilirubin Total: 0.8 mg/dL (ref 0.0–1.2)
CO2: 25 mmol/L (ref 20–29)
CREATININE: 0.88 mg/dL (ref 0.76–1.27)
Calcium: 9.8 mg/dL (ref 8.7–10.2)
Chloride: 101 mmol/L (ref 96–106)
GFR calc Af Amer: 109 mL/min/{1.73_m2} (ref >59)
GFR calc non Af Amer: 94 mL/min/{1.73_m2} (ref >59)
Globulin, Total: 3 g/dL (ref 1.5–4.5)
Glucose: 183 mg/dL — ABNORMAL HIGH (ref 65–99)
Potassium: 4.7 mmol/L (ref 3.5–5.2)
Sodium: 141 mmol/L (ref 134–144)
Total Protein: 7.5 g/dL (ref 6.0–8.5)

## 2018-01-06 ENCOUNTER — Other Ambulatory Visit: Payer: Self-pay | Admitting: Physician Assistant

## 2018-02-03 ENCOUNTER — Other Ambulatory Visit: Payer: Self-pay | Admitting: Physician Assistant

## 2018-02-03 DIAGNOSIS — E109 Type 1 diabetes mellitus without complications: Secondary | ICD-10-CM

## 2018-03-04 ENCOUNTER — Other Ambulatory Visit: Payer: Self-pay | Admitting: Physician Assistant

## 2018-03-04 DIAGNOSIS — E109 Type 1 diabetes mellitus without complications: Secondary | ICD-10-CM

## 2018-03-22 ENCOUNTER — Other Ambulatory Visit: Payer: Self-pay | Admitting: Physician Assistant

## 2018-03-22 DIAGNOSIS — I10 Essential (primary) hypertension: Secondary | ICD-10-CM

## 2018-03-29 ENCOUNTER — Ambulatory Visit: Payer: 59 | Admitting: Physician Assistant

## 2018-03-29 ENCOUNTER — Other Ambulatory Visit: Payer: Self-pay | Admitting: Physician Assistant

## 2018-03-29 ENCOUNTER — Encounter: Payer: Self-pay | Admitting: Physician Assistant

## 2018-03-29 VITALS — BP 139/78 | HR 69 | Temp 97.0°F | Ht 69.0 in | Wt 194.2 lb

## 2018-03-29 DIAGNOSIS — I10 Essential (primary) hypertension: Secondary | ICD-10-CM | POA: Diagnosis not present

## 2018-03-29 DIAGNOSIS — E109 Type 1 diabetes mellitus without complications: Secondary | ICD-10-CM | POA: Diagnosis not present

## 2018-03-29 LAB — BAYER DCA HB A1C WAIVED: HB A1C (BAYER DCA - WAIVED): 10.4 % — ABNORMAL HIGH (ref <7.0)

## 2018-03-29 MED ORDER — AZITHROMYCIN 250 MG PO TABS: ORAL_TABLET | ORAL | 0 refills | Status: DC

## 2018-03-29 NOTE — Progress Notes (Signed)
BP 139/78   Pulse 69   Temp (!) 97 F (36.1 C) (Oral)   Ht 5' 9"  (1.753 m)   Wt 194 lb 3.4 oz (88.1 kg)   BMI 28.68 kg/m    Subjective:    Patient ID: Charles Foley, male    DOB: 1961/02/12, 57 y.o.   MRN: [id number]  HPI: Charles Foley is a 57 y.o. male presenting on 03/29/2018 for Diabetes and Hypertension  This patient comes in for periodic recheck on medications and conditions including Diabetes and hypertension.  Also he does have chronic joint pain.  He is having a very good summer.  He is only working his one job.  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  He does miss the money but was an appropriate thing for him to do this summer.  Reports no issues with sugars being too high or low, trying to eat properly.  All medications are reviewed today. There are no reports of any problems with the medications. All of the medical conditions are reviewed and updated.  Lab work is reviewed and will be ordered as medically necessary. There are no new problems reported with today's visit.   Past Medical History:  Diagnosis Date  . Allergic rhinitis   . Anxiety   . Arthritis   . Cancer [facility]) 2012   prostate  . Chest pain   . Diabetes mellitus without complication ([place])    Relevant past medical, surgical, family and social history reviewed and updated as indicated. Interim medical history since our last visit reviewed. Allergies and medications reviewed and updated. DATA REVIEWED: CHART IN EPIC  Family History reviewed for pertinent findings.  Review of Systems  Constitutional: Negative.  Negative for appetite change and fatigue.  HENT: Negative.   Eyes: Negative.  Negative for pain and visual disturbance.  Respiratory: Negative.  Negative for cough, chest tightness, shortness of breath and wheezing.   Cardiovascular: Negative.  Negative for chest pain, palpitations and leg swelling.  Gastrointestinal:  Negative.  Negative for abdominal pain, diarrhea, nausea and vomiting.  Endocrine: Negative.   Genitourinary: Negative.   Musculoskeletal: Negative.   Skin: Negative.  Negative for color change and rash.  Neurological: Negative.  Negative for weakness, numbness and headaches.  Psychiatric/Behavioral: Negative.     Allergies as of 03/29/2018   No Known Allergies     Medication List        Accurate as of 03/29/18 10:23 AM. Always use your most recent med list.          acetaminophen 325 MG tablet Commonly known as:  TYLENOL Take 650 mg by mouth every 6 (six) hours as needed for pain.   Acetaminophen-Codeine 300-30 MG tablet Commonly known as:  TYLENOL/CODEINE #3 Take 1-2 tablets by mouth every 6 (six) hours as needed for pain.   ALPRAZolam 1 MG tablet Commonly known as:  XANAX Take 1 tablet (1 mg total) by mouth at bedtime as needed. for sleep   atorvastatin 10 MG tablet Commonly known as:  LIPITOR TAKE 1 TABLET DAILY   azithromycin 250 MG tablet Commonly known as:  ZITHROMAX Z-PAK Take as directed   BASAGLAR KWIKPEN 100 UNIT/ML Sopn USE AS DIRECTED UP TO 150 UNITS A DAY   Biotin 1000 MCG tablet Take 1,000 mcg by mouth 3 (three) times daily.   ibuprofen 200 MG tablet Commonly known as:  ADVIL,MOTRIN Take 200 mg by mouth  every 6 (six) hours as needed for pain.   insulin lispro 100 UNIT/ML injection Commonly known as:  HUMALOG Inject 1 mL (100 Units total) into the skin 3 (three) times daily with meals.   levothyroxine 25 MCG tablet Commonly known as:  SYNTHROID, LEVOTHROID TAKE (1) TABLET DAILY BEFORE BREAKFAST.   lisinopril 20 MG tablet Commonly known as:  PRINIVIL,ZESTRIL TAKE 1 TABLET DAILY   loratadine 10 MG tablet Commonly known as:  CLARITIN Take 10 mg by mouth daily.   meloxicam 7.5 MG tablet Commonly known as:  MOBIC Take 1 tablet (7.5 mg total) by mouth daily.   multivitamin tablet Take 1 tablet by mouth daily.   pantoprazole 40 MG  tablet Commonly known as:  PROTONIX Take 40 mg by mouth daily.          Objective:    BP 139/78   Pulse 69   Temp (!) 97 F (36.1 C) (Oral)   Ht 5' 9"  (1.753 m)   Wt 194 lb 3.4 oz (88.1 kg)   BMI 28.68 kg/m   No Known Allergies  Wt Readings from Last 3 Encounters:  03/29/18 194 lb 3.4 oz (88.1 kg)  12/28/17 196 lb 12.8 oz (89.3 kg)  09/24/17 195 lb 3.2 oz (88.5 kg)    Physical Exam  Constitutional: He appears well-developed and well-nourished. No distress.  HENT:  Head: Normocephalic and atraumatic.  Eyes: Pupils are equal, round, and reactive to light. Conjunctivae and EOM are normal.  Cardiovascular: Normal rate, regular rhythm and normal heart sounds.  Pulmonary/Chest: Effort normal and breath sounds normal. No respiratory distress.  Skin: Skin is warm and dry.  Psychiatric: He has a normal mood and affect. His behavior is normal.  Nursing note and vitals reviewed.   Results for orders placed or performed in visit on 12/28/17  CMP14+EGFR  Result Value Ref Range   Glucose 183 (H) 65 - 99 mg/dL   BUN 21 6 - 24 mg/dL   Creatinine, Ser 0.88 0.76 - 1.27 mg/dL   GFR calc non Af Amer 94 >59 mL/min/1.73   GFR calc Af Amer 109 >59 mL/min/1.73   BUN/Creatinine Ratio 24 (H) 9 - 20   Sodium 141 134 - 144 mmol/L   Potassium 4.7 3.5 - 5.2 mmol/L   Chloride 101 96 - 106 mmol/L   CO2 25 20 - 29 mmol/L   Calcium 9.8 8.7 - 10.2 mg/dL   Total Protein 7.5 6.0 - 8.5 g/dL   Albumin 4.5 3.5 - 5.5 g/dL   Globulin, Total 3.0 1.5 - 4.5 g/dL   Albumin/Globulin Ratio 1.5 1.2 - 2.2   Bilirubin Total 0.8 0.0 - 1.2 mg/dL   Alkaline Phosphatase 73 39 - 117 IU/L   AST 20 0 - 40 IU/L   ALT 29 0 - 44 IU/L  Bayer DCA Hb A1c Waived  Result Value Ref Range   HB A1C (BAYER DCA - WAIVED) 10.4 (H) <7.0 %      Assessment & Plan:   1. Essential hypertension - CBC with Differential/Platelet - CMP14+EGFR - Lipid panel - Bayer DCA Hb A1c Waived  2. Type 1 diabetes mellitus without  complication (HCC) - CBC with Differential/Platelet - CMP14+EGFR - Lipid panel - Bayer DCA Hb A1c Waived   Continue all other maintenance medications as listed above.  Follow up plan: Recheck 3 months  Educational handout given for Jennings PA-C [facility] [address]  [place], [place] [address] [phone]  03/29/2018, 10:23 AM

## 2018-03-30 LAB — CMP14+EGFR
ALBUMIN: 4.8 g/dL (ref 3.5–5.5)
ALK PHOS: 84 IU/L (ref 39–117)
ALT: 23 IU/L (ref 0–44)
AST: 14 IU/L (ref 0–40)
Albumin/Globulin Ratio: 1.6 (ref 1.2–2.2)
BUN/Creatinine Ratio: 24 — ABNORMAL HIGH (ref 9–20)
BUN: 20 mg/dL (ref 6–24)
Bilirubin Total: 1 mg/dL (ref 0.0–1.2)
CO2: 18 mmol/L — AB (ref 20–29)
CREATININE: 0.83 mg/dL (ref 0.76–1.27)
Calcium: 9.8 mg/dL (ref 8.7–10.2)
Chloride: 102 mmol/L (ref 96–106)
GFR calc Af Amer: 111 mL/min/{1.73_m2} (ref >59)
GFR calc non Af Amer: 96 mL/min/{1.73_m2} (ref >59)
GLUCOSE: 254 mg/dL — AB (ref 65–99)
Globulin, Total: 3 g/dL (ref 1.5–4.5)
Potassium: 5.1 mmol/L (ref 3.5–5.2)
Sodium: 140 mmol/L (ref 134–144)
Total Protein: 7.8 g/dL (ref 6.0–8.5)

## 2018-03-30 LAB — LIPID PANEL
CHOLESTEROL TOTAL: 140 mg/dL (ref 100–199)
Chol/HDL Ratio: 2.6 ratio (ref 0.0–5.0)
HDL: 54 mg/dL (ref >39)
LDL CALC: 71 mg/dL (ref 0–99)
Triglycerides: 75 mg/dL (ref 0–149)
VLDL CHOLESTEROL CAL: 15 mg/dL (ref 5–40)

## 2018-03-30 LAB — CBC WITH DIFFERENTIAL/PLATELET
BASOS: 0 %
Basophils Absolute: 0 10*3/uL (ref 0.0–0.2)
EOS (ABSOLUTE): 0.1 10*3/uL (ref 0.0–0.4)
EOS: 1 %
HEMATOCRIT: 42.5 % (ref 37.5–51.0)
HEMOGLOBIN: 14.6 g/dL (ref 13.0–17.7)
Immature Grans (Abs): 0 10*3/uL (ref 0.0–0.1)
Immature Granulocytes: 0 %
LYMPHS ABS: 2.2 10*3/uL (ref 0.7–3.1)
LYMPHS: 32 %
MCH: 30.9 pg (ref 26.6–33.0)
MCHC: 34.4 g/dL (ref 31.5–35.7)
MCV: 90 fL (ref 79–97)
MONOCYTES: 6 %
Monocytes Absolute: 0.4 10*3/uL (ref 0.1–0.9)
NEUTROS ABS: 4.1 10*3/uL (ref 1.4–7.0)
NEUTROS PCT: 61 %
Platelets: 277 10*3/uL (ref 150–450)
RBC: 4.72 x10E6/uL (ref 4.14–5.80)
RDW: 13.2 % (ref 12.3–15.4)
WBC: 6.8 10*3/uL (ref 3.4–10.8)

## 2018-04-08 ENCOUNTER — Other Ambulatory Visit: Payer: Self-pay | Admitting: Physician Assistant

## 2018-04-16 ENCOUNTER — Other Ambulatory Visit: Payer: Self-pay | Admitting: Physician Assistant

## 2018-04-16 DIAGNOSIS — E109 Type 1 diabetes mellitus without complications: Secondary | ICD-10-CM

## 2018-04-19 ENCOUNTER — Other Ambulatory Visit: Payer: Self-pay | Admitting: Physician Assistant

## 2018-04-19 DIAGNOSIS — I10 Essential (primary) hypertension: Secondary | ICD-10-CM

## 2018-06-21 ENCOUNTER — Other Ambulatory Visit: Payer: Self-pay | Admitting: Physician Assistant

## 2018-06-22 NOTE — Telephone Encounter (Signed)
Last thyroid 08/12/16

## 2018-06-28 ENCOUNTER — Other Ambulatory Visit: Payer: Self-pay | Admitting: Physician Assistant

## 2018-07-01 ENCOUNTER — Ambulatory Visit: Payer: 59 | Admitting: Physician Assistant

## 2018-07-01 ENCOUNTER — Ambulatory Visit (INDEPENDENT_AMBULATORY_CARE_PROVIDER_SITE_OTHER): Payer: 59

## 2018-07-01 ENCOUNTER — Encounter: Payer: Self-pay | Admitting: Physician Assistant

## 2018-07-01 VITALS — BP 150/76 | HR 61 | Temp 97.8°F | Ht 69.0 in | Wt 197.0 lb

## 2018-07-01 DIAGNOSIS — E109 Type 1 diabetes mellitus without complications: Secondary | ICD-10-CM

## 2018-07-01 DIAGNOSIS — I10 Essential (primary) hypertension: Secondary | ICD-10-CM

## 2018-07-01 DIAGNOSIS — M25551 Pain in right hip: Secondary | ICD-10-CM

## 2018-07-01 DIAGNOSIS — M158 Other polyosteoarthritis: Secondary | ICD-10-CM | POA: Diagnosis not present

## 2018-07-01 DIAGNOSIS — Z23 Encounter for immunization: Secondary | ICD-10-CM | POA: Diagnosis not present

## 2018-07-01 DIAGNOSIS — M25561 Pain in right knee: Secondary | ICD-10-CM

## 2018-07-01 LAB — BAYER DCA HB A1C WAIVED: HB A1C: 10 % — AB (ref <7.0)

## 2018-07-01 MED ORDER — ACETAMINOPHEN-CODEINE 300-30 MG PO TABS: 1.0000 | ORAL_TABLET | Freq: Four times a day (QID) | ORAL | 5 refills | Status: DC | PRN

## 2018-07-01 MED ORDER — CYCLOBENZAPRINE HCL 10 MG PO TABS: 10.0000 mg | ORAL_TABLET | Freq: Three times a day (TID) | ORAL | 1 refills | Status: DC | PRN

## 2018-07-01 MED ORDER — METHYLPREDNISOLONE ACETATE 80 MG/ML IJ SUSP
80.0000 mg | Freq: Once | INTRAMUSCULAR | Status: AC
  Administered 2018-07-01: 80 mg via INTRAMUSCULAR

## 2018-07-01 NOTE — Patient Instructions (Signed)

## 2018-07-01 NOTE — Progress Notes (Signed)
BP (!) 150/76   Pulse 61   Temp 97.8 F (36.6 C) (Oral)   Ht _0  (1.753 m)   Wt 197 lb (89.4 kg)   BMI 29.09 kg/m    Subjective:    Patient ID: Charles Foley, male    DOB: June 05, 1961, 57 y.o.   MRN: [id number]  HPI: Charles Foley is a 56 y.o. male presenting on 07/01/2018 for Medical Management of Chronic Issues  This patient comes in for chronic recheck on his medical conditions.  They do include hypertension, type 1 diabetes, osteoarthritis in many joints.  He has been having significant pain in his right knee and right hip.  He even feels popping and clicking in the hip.  It hurts significantly when he goes from sitting to standing.  He does work a very physical job.  He is tried some over-the-counter medications without much relief.  His diabetes seems to be doing some better.  He has had much better readings.  He currently is doing his basic lar twice a day at lunch and evening.  He is taking Humalog 15 to 20 units at to meals.  We will have labs drawn today.  Past Medical History:  Diagnosis Date  . Allergic rhinitis   . Anxiety   . Arthritis   . Cancer [facility]) 2012   prostate  . Chest pain   . Diabetes mellitus without complication ([place])    Relevant past medical, surgical, family and social history reviewed and updated as indicated. Interim medical history since our last visit reviewed. Allergies and medications reviewed and updated. DATA REVIEWED: CHART IN EPIC  Family History reviewed for pertinent findings.  Review of Systems  Constitutional: Negative.  Negative for appetite change and fatigue.  HENT: Negative.   Eyes: Negative.  Negative for pain and visual disturbance.  Respiratory: Negative.  Negative for cough, chest tightness, shortness of breath and wheezing.   Cardiovascular: Negative.  Negative for chest pain, palpitations and leg swelling.  Gastrointestinal: Negative.  Negative for abdominal pain, diarrhea, nausea and vomiting.  Endocrine: Negative.     Genitourinary: Negative.   Musculoskeletal: Positive for arthralgias, back pain, gait problem and myalgias.  Skin: Negative.  Negative for color change and rash.  Neurological: Negative for weakness, numbness and headaches.  Psychiatric/Behavioral: Negative.     Allergies as of 07/01/2018   No Known Allergies     Medication List        Accurate as of 07/01/18  9:29 AM. Always use your most recent med list.          acetaminophen 325 MG tablet Commonly known as:  TYLENOL Take 650 mg by mouth every 6 (six) hours as needed for pain.   Acetaminophen-Codeine 300-30 MG tablet Take 1-2 tablets by mouth every 6 (six) hours as needed for pain.   ALPRAZolam 1 MG tablet Commonly known as:  XANAX Take 1 tablet (1 mg total) by mouth at bedtime as needed. for sleep   atorvastatin 10 MG tablet Commonly known as:  LIPITOR TAKE 1 TABLET DAILY   BASAGLAR KWIKPEN 100 UNIT/ML Sopn USE AS DIRECTED UP TO 150 UNITS A DAY   Biotin 1000 MCG tablet Take 1,000 mcg by mouth 3 (three) times daily.   cyclobenzaprine 10 MG tablet Commonly known as:  FLEXERIL Take 1 tablet (10 mg total) by mouth 3 (three) times daily as needed for muscle spasms.   ibuprofen 200 MG tablet Commonly known as:  ADVIL,MOTRIN Take 200 mg by mouth every  6 (six) hours as needed for pain.   insulin lispro 100 UNIT/ML injection Commonly known as:  HUMALOG Inject 1 mL (100 Units total) into the skin 3 (three) times daily with meals.   levothyroxine 25 MCG tablet Commonly known as:  SYNTHROID, LEVOTHROID TAKE (1) TABLET DAILY BEFORE BREAKFAST.   lisinopril 20 MG tablet Commonly known as:  PRINIVIL,ZESTRIL TAKE 1 TABLET DAILY   loratadine 10 MG tablet Commonly known as:  CLARITIN Take 10 mg by mouth daily.   meloxicam 7.5 MG tablet Commonly known as:  MOBIC TAKE 1 TABLET DAILY   multivitamin tablet Take 1 tablet by mouth daily.   ONE TOUCH ULTRA TEST test strip Generic drug:  glucose blood CHECK BLOOD  SUGAR 1 TO 2 TIMES DAILY OR AS DIRECTED   pantoprazole 40 MG tablet Commonly known as:  PROTONIX Take 40 mg by mouth daily.          Objective:    BP (!) 150/76   Pulse 61   Temp 97.8 F (36.6 C) (Oral)   Ht _0  (1.753 m)   Wt 197 lb (89.4 kg)   BMI 29.09 kg/m   No Known Allergies  Wt Readings from Last 3 Encounters:  07/01/18 197 lb (89.4 kg)  03/29/18 194 lb 3.4 oz (88.1 kg)  12/28/17 196 lb 12.8 oz (89.3 kg)    Physical Exam  Constitutional: He appears well-developed and well-nourished. No distress.  HENT:  Head: Normocephalic and atraumatic.  Eyes: Pupils are equal, round, and reactive to light. Conjunctivae and EOM are normal.  Cardiovascular: Normal rate, regular rhythm and normal heart sounds.  Pulmonary/Chest: Effort normal and breath sounds normal. No respiratory distress.  Skin: Skin is warm and dry.  Psychiatric: He has a normal mood and affect. His behavior is normal.  Nursing note and vitals reviewed.   Results for orders placed or performed in visit on 03/29/18  CBC with Differential/Platelet  Result Value Ref Range   WBC 6.8 3.4 - 10.8 x10E3/uL   RBC 4.72 4.14 - 5.80 x10E6/uL   Hemoglobin 14.6 13.0 - 17.7 g/dL   Hematocrit 42.5 37.5 - 51.0 %   MCV 90 79 - 97 fL   MCH 30.9 26.6 - 33.0 pg   MCHC 34.4 31.5 - 35.7 g/dL   RDW 13.2 12.3 - 15.4 %   Platelets 277 150 - 450 x10E3/uL   Neutrophils 61 Not Estab. %   Lymphs 32 Not Estab. %   Monocytes 6 Not Estab. %   Eos 1 Not Estab. %   Basos 0 Not Estab. %   Neutrophils Absolute 4.1 1.4 - 7.0 x10E3/uL   Lymphocytes Absolute 2.2 0.7 - 3.1 x10E3/uL   Monocytes Absolute 0.4 0.1 - 0.9 x10E3/uL   EOS (ABSOLUTE) 0.1 0.0 - 0.4 x10E3/uL   Basophils Absolute 0.0 0.0 - 0.2 x10E3/uL   Immature Granulocytes 0 Not Estab. %   Immature Grans (Abs) 0.0 0.0 - 0.1 x10E3/uL  CMP14+EGFR  Result Value Ref Range   Glucose 254 (H) 65 - 99 mg/dL   BUN 20 6 - 24 mg/dL   Creatinine, Ser 0.83 0.76 - 1.27 mg/dL   GFR  calc non Af Amer 96 >59 mL/min/1.73   GFR calc Af Amer 111 >59 mL/min/1.73   BUN/Creatinine Ratio 24 (H) 9 - 20   Sodium 140 134 - 144 mmol/L   Potassium 5.1 3.5 - 5.2 mmol/L   Chloride 102 96 - 106 mmol/L   CO2 18 (L) 20 - 29  mmol/L   Calcium 9.8 8.7 - 10.2 mg/dL   Total Protein 7.8 6.0 - 8.5 g/dL   Albumin 4.8 3.5 - 5.5 g/dL   Globulin, Total 3.0 1.5 - 4.5 g/dL   Albumin/Globulin Ratio 1.6 1.2 - 2.2   Bilirubin Total 1.0 0.0 - 1.2 mg/dL   Alkaline Phosphatase 84 39 - 117 IU/L   AST 14 0 - 40 IU/L   ALT 23 0 - 44 IU/L  Lipid panel  Result Value Ref Range   Cholesterol, Total 140 100 - 199 mg/dL   Triglycerides 75 0 - 149 mg/dL   HDL 54 >39 mg/dL   VLDL Cholesterol Cal 15 5 - 40 mg/dL   LDL Calculated 71 0 - 99 mg/dL   Chol/HDL Ratio 2.6 0.0 - 5.0 ratio  Bayer DCA Hb A1c Waived  Result Value Ref Range   HB A1C (BAYER DCA - WAIVED) 10.4 (H) <7.0 %      Assessment & Plan:   1. Essential hypertension Continue medication  2. Type 1 diabetes mellitus without complication (HCC) - Bayer DCA Hb A1c Waived  3. Other osteoarthritis involving multiple joints - Acetaminophen-Codeine (TYLENOL/CODEINE #3) 300-30 MG tablet; Take 1-2 tablets by mouth every 6 (six) hours as needed for pain.  Dispense: 40 tablet; Refill: 5 - cyclobenzaprine (FLEXERIL) 10 MG tablet; Take 1 tablet (10 mg total) by mouth 3 (three) times daily as needed for muscle spasms.  Dispense: 90 tablet; Refill: 1  4. Right hip pain - DG HIP UNILAT W OR W/O PELVIS 2-3 VIEWS RIGHT; Future - methylPREDNISolone acetate (DEPO-MEDROL) injection 80 mg  5. Acute pain of right knee - Acetaminophen-Codeine (TYLENOL/CODEINE #3) 300-30 MG tablet; Take 1-2 tablets by mouth every 6 (six) hours as needed for pain.  Dispense: 40 tablet; Refill: 5  6. Need for immunization against influenza - Flu Vaccine QUAD 36+ mos IM   Continue all other maintenance medications as listed above.  Follow up plan: Return in about 3 months  (around 10/01/2018) for recheck. /  lesion removal 30 minute slot also.  Educational handout given for [place] PA-C [place] [address]  [place], [place] [address] [phone]   07/01/2018, 9:29 AM

## 2018-07-23 ENCOUNTER — Other Ambulatory Visit: Payer: Self-pay | Admitting: Physician Assistant

## 2018-07-23 DIAGNOSIS — E039 Hypothyroidism, unspecified: Secondary | ICD-10-CM

## 2018-07-23 NOTE — Telephone Encounter (Signed)
Last thyroid 08/12/16

## 2018-07-27 ENCOUNTER — Encounter: Payer: Self-pay | Admitting: Physician Assistant

## 2018-07-27 ENCOUNTER — Ambulatory Visit: Payer: 59 | Admitting: Physician Assistant

## 2018-07-27 VITALS — BP 138/79 | HR 73 | Ht 69.0 in | Wt 199.8 lb

## 2018-07-27 DIAGNOSIS — L918 Other hypertrophic disorders of the skin: Secondary | ICD-10-CM | POA: Diagnosis not present

## 2018-07-27 NOTE — Patient Instructions (Signed)
Skin Tag, Adult A skin tag (acrochordon) is a soft, extra growth of skin. Most skin tags are flesh-colored and rarely bigger than a pencil eraser. They commonly form near areas where there are folds in the skin, such as the armpit or groin. Skin tags are not dangerous, and they do not spread from person to person (are not contagious). You may have one skin tag or several. Skin tags do not require treatment. However, your health care provider may recommend removal of a skin tag if it:  Gets irritated from clothing.  Bleeds.  Is visible and unsightly.  Your health care provider can remove skin tags with a simple surgical procedure or a procedure that involves freezing the skin tag. Follow these instructions at home:  Watch for any changes in your skin tag. A normal skin tag does not require any other special care at home.  Take over-the-counter and prescription medicines only as told by your health care provider.  Keep all follow-up visits as told by your health care provider. This is important. Contact a health care provider if:  You have a skin tag that: ? Becomes painful. ? Changes color. ? Bleeds. ? Swells.  You develop more skin tags. This information is not intended to replace advice given to you by your health care provider. Make sure you discuss any questions you have with your health care provider. Document Released: 09/23/2015 Document Revised: 05/04/2016 Document Reviewed: 09/23/2015 Elsevier Interactive Patient Education  2018 Elsevier Inc.  

## 2018-07-28 ENCOUNTER — Other Ambulatory Visit: Payer: Self-pay | Admitting: Physician Assistant

## 2018-07-28 NOTE — Progress Notes (Signed)
BP 138/79   Pulse 73   Ht 5\' 9"  (1.753 m)   Wt 199 lb 12.8 oz (90.6 kg)   BMI 29.51 kg/m    Subjective:    Patient ID: Charles Foley, male    DOB: 09/02/1961, 57 y.o.   MRN: [id number]  HPI: Charles Foley is a 57 y.o. male presenting on 07/27/2018 for skin tag removal  Patient comes in to have a skin tag removed.  Is on the back of his neck just below the hairline.  It does become irritated and his close touch on it.  It has never bled or drained any pus but it has become sore to the touch.  He has had one other skin tag removed in the past but had no difficulty with that.   Past Medical History:  Diagnosis Date  . Allergic rhinitis   . Anxiety   . Arthritis   . Cancer [facility]) 2012   prostate  . Chest pain   . Diabetes mellitus without complication ([place])    Relevant past medical, surgical, family and social history reviewed and updated as indicated. Interim medical history since our last visit reviewed. Allergies and medications reviewed and updated. DATA REVIEWED: CHART IN EPIC  Family History reviewed for pertinent findings.  Review of Systems  Constitutional: Negative.  Negative for appetite change and fatigue.  Eyes: Negative for pain and visual disturbance.  Respiratory: Negative.  Negative for cough, chest tightness, shortness of breath and wheezing.   Cardiovascular: Negative.  Negative for chest pain, palpitations and leg swelling.  Gastrointestinal: Negative.  Negative for abdominal pain, diarrhea, nausea and vomiting.  Genitourinary: Negative.   Skin: Negative.  Negative for color change and rash.  Neurological: Negative.  Negative for weakness, numbness and headaches.  Psychiatric/Behavioral: Negative.     Allergies as of 07/27/2018   No Known Allergies     Medication List        Accurate as of 07/27/18 11:59 PM. Always use your most recent med list.          acetaminophen 325 MG tablet Commonly known as:  TYLENOL Take 650 mg by mouth every 6 (six)  hours as needed for pain.   Acetaminophen-Codeine 300-30 MG tablet Take 1-2 tablets by mouth every 6 (six) hours as needed for pain.   ALPRAZolam 1 MG tablet Commonly known as:  XANAX Take 1 tablet (1 mg total) by mouth at bedtime as needed. for sleep   atorvastatin 10 MG tablet Commonly known as:  LIPITOR TAKE 1 TABLET DAILY   BASAGLAR KWIKPEN 100 UNIT/ML Sopn USE AS DIRECTED UP TO 150 UNITS A DAY   Biotin 1000 MCG tablet Take 1,000 mcg by mouth 3 (three) times daily.   cyclobenzaprine 10 MG tablet Commonly known as:  FLEXERIL Take 1 tablet (10 mg total) by mouth 3 (three) times daily as needed for muscle spasms.   ibuprofen 200 MG tablet Commonly known as:  ADVIL,MOTRIN Take 200 mg by mouth every 6 (six) hours as needed for pain.   insulin lispro 100 UNIT/ML injection Commonly known as:  HUMALOG Inject 1 mL (100 Units total) into the skin 3 (three) times daily with meals.   levothyroxine 25 MCG tablet Commonly known as:  SYNTHROID, LEVOTHROID TAKE (1) TABLET DAILY BEFORE BREAKFAST.   lisinopril 20 MG tablet Commonly known as:  PRINIVIL,ZESTRIL TAKE 1 TABLET DAILY   loratadine 10 MG tablet Commonly known as:  CLARITIN Take 10 mg by mouth daily.  meloxicam 7.5 MG tablet Commonly known as:  MOBIC TAKE 1 TABLET DAILY   multivitamin tablet Take 1 tablet by mouth daily.   ONE TOUCH ULTRA TEST test strip Generic drug:  glucose blood CHECK BLOOD SUGAR 1 TO 2 TIMES DAILY OR AS DIRECTED   pantoprazole 40 MG tablet Commonly known as:  PROTONIX Take 40 mg by mouth daily.          Objective:    BP 138/79   Pulse 73   Ht 5\' 9"  (1.753 m)   Wt 199 lb 12.8 oz (90.6 kg)   BMI 29.51 kg/m   No Known Allergies  Wt Readings from Last 3 Encounters:  07/27/18 199 lb 12.8 oz (90.6 kg)  07/01/18 197 lb (89.4 kg)  03/29/18 194 lb 3.4 oz (88.1 kg)    Physical Exam  Constitutional: He appears well-developed and well-nourished. No distress.  HENT:  Head:  Normocephalic and atraumatic.  Eyes: Pupils are equal, round, and reactive to light. Conjunctivae and EOM are normal.  Cardiovascular: Normal rate, regular rhythm and normal heart sounds.  Pulmonary/Chest: Effort normal and breath sounds normal. No respiratory distress.  Skin: Skin is warm and dry.     1 cm broad bases skin tag on neck, mildly tender. After removal, wound care given  Psychiatric: He has a normal mood and affect. His behavior is normal.  Nursing note and vitals reviewed.   Results for orders placed or performed in visit on 07/01/18  Bayer DCA Hb A1c Waived  Result Value Ref Range   HB A1C (BAYER DCA - WAIVED) 10.0 (H) <7.0 %      Assessment & Plan:   1. Skin tag, acquired  Skin tag are snipped off using sterile technique. Local anesthesia was used. Removed with 10 blade, clearing the base of the 1cm width tag on neck. Minimal bleeding, controlled by silver nitrate. These pathognomonic lesions are not sent for pathology.    Continue all other maintenance medications as listed above.  Follow up plan: No follow-ups on file.  Educational handout given for [place] PA-C [place] [address]  [place], [place] [address] [phone]   07/28/2018, 1:41 PM

## 2018-08-04 ENCOUNTER — Other Ambulatory Visit: Payer: Self-pay | Admitting: Physician Assistant

## 2018-08-04 DIAGNOSIS — E109 Type 1 diabetes mellitus without complications: Secondary | ICD-10-CM

## 2018-08-05 ENCOUNTER — Other Ambulatory Visit: Payer: Self-pay | Admitting: Physician Assistant

## 2018-08-05 DIAGNOSIS — E109 Type 1 diabetes mellitus without complications: Secondary | ICD-10-CM

## 2018-08-05 MED ORDER — INSULIN LISPRO 100 UNIT/ML ~~LOC~~ SOLN: 20.0000 [IU] | Freq: Three times a day (TID) | SUBCUTANEOUS | 5 refills | Status: DC

## 2018-08-06 ENCOUNTER — Other Ambulatory Visit: Payer: Self-pay | Admitting: Physician Assistant

## 2018-08-06 DIAGNOSIS — J0110 Acute frontal sinusitis, unspecified: Secondary | ICD-10-CM

## 2018-08-13 ENCOUNTER — Other Ambulatory Visit: Payer: Self-pay | Admitting: Physician Assistant

## 2018-08-13 DIAGNOSIS — E109 Type 1 diabetes mellitus without complications: Secondary | ICD-10-CM

## 2018-08-27 ENCOUNTER — Other Ambulatory Visit: Payer: Self-pay | Admitting: Physician Assistant

## 2018-08-27 DIAGNOSIS — M158 Other polyosteoarthritis: Secondary | ICD-10-CM

## 2018-08-27 NOTE — Telephone Encounter (Signed)
Last seen 07/27/18

## 2018-09-28 ENCOUNTER — Other Ambulatory Visit: Payer: Self-pay | Admitting: Physician Assistant

## 2018-10-01 ENCOUNTER — Ambulatory Visit (INDEPENDENT_AMBULATORY_CARE_PROVIDER_SITE_OTHER): Payer: 59 | Admitting: Physician Assistant

## 2018-10-01 ENCOUNTER — Encounter: Payer: Self-pay | Admitting: Physician Assistant

## 2018-10-01 VITALS — BP 158/78 | HR 71 | Temp 97.0°F | Ht 69.0 in | Wt 202.0 lb

## 2018-10-01 DIAGNOSIS — F411 Generalized anxiety disorder: Secondary | ICD-10-CM | POA: Diagnosis not present

## 2018-10-01 DIAGNOSIS — M158 Other polyosteoarthritis: Secondary | ICD-10-CM | POA: Diagnosis not present

## 2018-10-01 DIAGNOSIS — E039 Hypothyroidism, unspecified: Secondary | ICD-10-CM | POA: Insufficient documentation

## 2018-10-01 DIAGNOSIS — E109 Type 1 diabetes mellitus without complications: Secondary | ICD-10-CM | POA: Diagnosis not present

## 2018-10-01 DIAGNOSIS — I10 Essential (primary) hypertension: Secondary | ICD-10-CM

## 2018-10-01 LAB — BAYER DCA HB A1C WAIVED: HB A1C (BAYER DCA - WAIVED): 10.2 % — ABNORMAL HIGH (ref <7.0)

## 2018-10-01 MED ORDER — CYCLOBENZAPRINE HCL 10 MG PO TABS: 10.0000 mg | ORAL_TABLET | Freq: Three times a day (TID) | ORAL | 5 refills | Status: DC | PRN

## 2018-10-01 MED ORDER — LISINOPRIL 20 MG PO TABS: 20.0000 mg | ORAL_TABLET | Freq: Every day | ORAL | 5 refills | Status: DC

## 2018-10-01 MED ORDER — BASAGLAR KWIKPEN 100 UNIT/ML ~~LOC~~ SOPN: PEN_INJECTOR | SUBCUTANEOUS | 5 refills | Status: DC

## 2018-10-01 MED ORDER — INSULIN LISPRO (1 UNIT DIAL) 100 UNIT/ML (KWIKPEN): PEN_INJECTOR | SUBCUTANEOUS | 5 refills | Status: DC

## 2018-10-01 MED ORDER — LEVOTHYROXINE SODIUM 25 MCG PO TABS: ORAL_TABLET | ORAL | 10 refills | Status: DC

## 2018-10-01 MED ORDER — ATORVASTATIN CALCIUM 10 MG PO TABS: 10.0000 mg | ORAL_TABLET | Freq: Every day | ORAL | 5 refills | Status: DC

## 2018-10-01 MED ORDER — ALPRAZOLAM 1 MG PO TABS: 1.0000 mg | ORAL_TABLET | Freq: Every evening | ORAL | 5 refills | Status: DC | PRN

## 2018-10-01 NOTE — Progress Notes (Signed)
BP (!) 158/78   Pulse 71   Temp (!) 97 F (36.1 C) (Oral)   Ht 5' 9"  (1.753 m)   Wt 202 lb (91.6 kg)   BMI 29.83 kg/m    Subjective:    Patient ID: Charles Foley, male    DOB: [birth year], 58 y.o.   MRN: [id number]  HPI: Charles Foley is a 58 y.o. male presenting on 10/01/2018 for Diabetes (3 month follow up) and Hypertension  This patient comes in for periodic recheck on medications and conditions including hypertension, osteoarthritis, type 1 diabetes, generalized anxiety, hypothyroidism.  Overall he is still been a lot of stress difficulty with work. His mother is also starting to memory issues.  He has been taking his medication well and trying very hard on his diabetic efforts.  When he is at home he is having very good numbers.  When he is alert he will have sporadic changes.  All medications are reviewed today. There are no reports of any problems with the medications. All of the medical conditions are reviewed and updated.  Lab work is reviewed and will be ordered as medically necessary. There are no new problems reported with today's visit.   Past Medical History:  Diagnosis Date  . Allergic rhinitis   . Anxiety   . Arthritis   . Cancer [facility]) 2012   prostate  . Chest pain   . Diabetes mellitus without complication ([place])    Relevant past medical, surgical, family and social history reviewed and updated as indicated. Interim medical history since our last visit reviewed. Allergies and medications reviewed and updated. DATA REVIEWED: CHART IN EPIC  Family History reviewed for pertinent findings.  Review of Systems  Constitutional: Negative.  Negative for appetite change and fatigue.  HENT: Negative.   Eyes: Negative.  Negative for pain and visual disturbance.  Respiratory: Negative.  Negative for cough, chest tightness, shortness of breath and wheezing.   Cardiovascular: Negative.  Negative for chest pain, palpitations and leg swelling.  Gastrointestinal: Negative.   Negative for abdominal pain, diarrhea, nausea and vomiting.  Endocrine: Negative.   Genitourinary: Negative.   Musculoskeletal: Negative.   Skin: Negative.  Negative for color change and rash.  Neurological: Negative.  Negative for weakness, numbness and headaches.  Psychiatric/Behavioral: Negative.     Allergies as of 10/01/2018   No Known Allergies     Medication List       Accurate as of October 01, 2018  8:26 AM. Always use your most recent med list.        acetaminophen 325 MG tablet Commonly known as:  TYLENOL Take 650 mg by mouth every 6 (six) hours as needed for pain.   Acetaminophen-Codeine 300-30 MG tablet Commonly known as:  TYLENOL/CODEINE #3 Take 1-2 tablets by mouth every 6 (six) hours as needed for pain.   ALPRAZolam 1 MG tablet Commonly known as:  XANAX Take 1 tablet (1 mg total) by mouth at bedtime as needed. for sleep   atorvastatin 10 MG tablet Commonly known as:  LIPITOR Take 1 tablet (10 mg total) by mouth daily.   BASAGLAR KWIKPEN 100 UNIT/ML Sopn USE AS DIRECTED UP TO 150 UNITS A DAY   Biotin 1000 MCG tablet Take 1,000 mcg by mouth 3 (three) times daily.   cyclobenzaprine 10 MG tablet Commonly known as:  FLEXERIL Take 1 tablet (10 mg total) by mouth 3 (three) times daily as needed for muscle spasms.   ibuprofen 200 MG tablet Commonly known as:  ADVIL,MOTRIN Take 200 mg by mouth every 6 (six) hours as needed for pain.   insulin lispro 100 UNIT/ML KwikPen Commonly known as:  HUMALOG KWIKPEN INJECT UP TO 20 UNITS TIS BEFORE MEALS   levothyroxine 25 MCG tablet Commonly known as:  SYNTHROID, LEVOTHROID TAKE (1) TABLET DAILY BEFORE BREAKFAST.   lisinopril 20 MG tablet Commonly known as:  PRINIVIL,ZESTRIL Take 1 tablet (20 mg total) by mouth daily.   loratadine 10 MG tablet Commonly known as:  CLARITIN Take 10 mg by mouth daily.   meloxicam 7.5 MG tablet Commonly known as:  MOBIC TAKE 1 TABLET DAILY   multivitamin tablet Take 1  tablet by mouth daily.   ONE TOUCH ULTRA TEST test strip Generic drug:  glucose blood CHECK BLOOD SUGAR 1 TO 2 TIMES DAILY OR AS DIRECTED   pantoprazole 40 MG tablet Commonly known as:  PROTONIX Take 40 mg by mouth daily.          Objective:    BP (!) 158/78   Pulse 71   Temp (!) 97 F (36.1 C) (Oral)   Ht 5' 9"  (1.753 m)   Wt 202 lb (91.6 kg)   BMI 29.83 kg/m   No Known Allergies  Wt Readings from Last 3 Encounters:  10/01/18 202 lb (91.6 kg)  07/27/18 199 lb 12.8 oz (90.6 kg)  07/01/18 197 lb (89.4 kg)    Physical Exam Constitutional:      Appearance: He is well-developed.  HENT:     Head: Normocephalic and atraumatic.  Eyes:     Conjunctiva/sclera: Conjunctivae normal.     Pupils: Pupils are equal, round, and reactive to light.  Neck:     Musculoskeletal: Normal range of motion and neck supple.  Cardiovascular:     Rate and Rhythm: Normal rate and regular rhythm.     Heart sounds: Normal heart sounds.  Pulmonary:     Effort: Pulmonary effort is normal.     Breath sounds: Normal breath sounds.  Abdominal:     General: Bowel sounds are normal.     Palpations: Abdomen is soft.  Musculoskeletal: Normal range of motion.  Skin:    General: Skin is warm and dry.     Results for orders placed or performed in visit on 07/01/18  Bayer DCA Hb A1c Waived  Result Value Ref Range   HB A1C (BAYER DCA - WAIVED) 10.0 (H) <7.0 %      Assessment & Plan:   1. Essential hypertension - lisinopril (PRINIVIL,ZESTRIL) 20 MG tablet; Take 1 tablet (20 mg total) by mouth daily.  Dispense: 30 tablet; Refill: 5 - CBC with Differential/Platelet - CMP14+EGFR - Lipid panel - Bayer DCA Hb A1c Waived  2. Other osteoarthritis involving multiple joints - cyclobenzaprine (FLEXERIL) 10 MG tablet; Take 1 tablet (10 mg total) by mouth 3 (three) times daily as needed for muscle spasms.  Dispense: 90 tablet; Refill: 5  3. Type 1 diabetes mellitus without complication (HCC) -  Insulin Glargine (BASAGLAR KWIKPEN) 100 UNIT/ML SOPN; USE AS DIRECTED UP TO 150 UNITS A DAY  Dispense: 30 mL; Refill: 5 - insulin lispro (HUMALOG KWIKPEN) 100 UNIT/ML KwikPen; INJECT UP TO 20 UNITS TIS BEFORE MEALS  Dispense: 30 mL; Refill: 5 - CBC with Differential/Platelet - CMP14+EGFR - Lipid panel - Bayer DCA Hb A1c Waived  4. GAD (generalized anxiety disorder) - ALPRAZolam (XANAX) 1 MG tablet; Take 1 tablet (1 mg total) by mouth at bedtime as needed. for sleep  Dispense: 30 tablet; Refill:  5  5. Hypothyroidism, unspecified type - levothyroxine (SYNTHROID, LEVOTHROID) 25 MCG tablet; TAKE (1) TABLET DAILY BEFORE BREAKFAST.  Dispense: 30 tablet; Refill: 10 - TSH   Continue all other maintenance medications as listed above.  Follow up plan: Return in about 3 months (around 12/31/2018).  Educational handout given for [place] PA-C [facility] [facility] [address]  [place], [place] [address] [phone]   10/01/2018, 8:26 AM

## 2018-10-02 LAB — CMP14+EGFR
ALT: 30 IU/L (ref 0–44)
AST: 20 IU/L (ref 0–40)
Albumin/Globulin Ratio: 1.6 (ref 1.2–2.2)
Albumin: 4.4 g/dL (ref 3.5–5.5)
Alkaline Phosphatase: 60 IU/L (ref 39–117)
BILIRUBIN TOTAL: 0.8 mg/dL (ref 0.0–1.2)
BUN/Creatinine Ratio: 23 — ABNORMAL HIGH (ref 9–20)
BUN: 19 mg/dL (ref 6–24)
CO2: 22 mmol/L (ref 20–29)
Calcium: 10 mg/dL (ref 8.7–10.2)
Chloride: 106 mmol/L (ref 96–106)
Creatinine, Ser: 0.83 mg/dL (ref 0.76–1.27)
GFR calc Af Amer: 111 mL/min/{1.73_m2} (ref >59)
GFR calc non Af Amer: 96 mL/min/{1.73_m2} (ref >59)
Globulin, Total: 2.7 g/dL (ref 1.5–4.5)
Glucose: 124 mg/dL — ABNORMAL HIGH (ref 65–99)
Potassium: 5.6 mmol/L — ABNORMAL HIGH (ref 3.5–5.2)
Sodium: 142 mmol/L (ref 134–144)
Total Protein: 7.1 g/dL (ref 6.0–8.5)

## 2018-10-02 LAB — CBC WITH DIFFERENTIAL/PLATELET
Basophils Absolute: 0 10*3/uL (ref 0.0–0.2)
Basos: 1 %
EOS (ABSOLUTE): 0.1 10*3/uL (ref 0.0–0.4)
EOS: 2 %
Hematocrit: 40.9 % (ref 37.5–51.0)
Hemoglobin: 13.9 g/dL (ref 13.0–17.7)
Immature Grans (Abs): 0 10*3/uL (ref 0.0–0.1)
Immature Granulocytes: 0 %
Lymphocytes Absolute: 2.6 10*3/uL (ref 0.7–3.1)
Lymphs: 44 %
MCH: 30.6 pg (ref 26.6–33.0)
MCHC: 34 g/dL (ref 31.5–35.7)
MCV: 90 fL (ref 79–97)
Monocytes Absolute: 0.5 10*3/uL (ref 0.1–0.9)
Monocytes: 8 %
Neutrophils Absolute: 2.7 10*3/uL (ref 1.4–7.0)
Neutrophils: 45 %
Platelets: 281 10*3/uL (ref 150–450)
RBC: 4.54 x10E6/uL (ref 4.14–5.80)
RDW: 12.7 % (ref 11.6–15.4)
WBC: 6 10*3/uL (ref 3.4–10.8)

## 2018-10-02 LAB — LIPID PANEL
Chol/HDL Ratio: 2.4 ratio (ref 0.0–5.0)
Cholesterol, Total: 137 mg/dL (ref 100–199)
HDL: 58 mg/dL (ref >39)
LDL Calculated: 71 mg/dL (ref 0–99)
TRIGLYCERIDES: 38 mg/dL (ref 0–149)
VLDL Cholesterol Cal: 8 mg/dL (ref 5–40)

## 2018-10-02 LAB — TSH: TSH: 1.62 u[IU]/mL (ref 0.450–4.500)

## 2018-10-05 ENCOUNTER — Other Ambulatory Visit: Payer: Self-pay | Admitting: *Deleted

## 2018-10-05 DIAGNOSIS — E109 Type 1 diabetes mellitus without complications: Secondary | ICD-10-CM

## 2018-10-05 DIAGNOSIS — E039 Hypothyroidism, unspecified: Secondary | ICD-10-CM

## 2018-11-02 DIAGNOSIS — E1065 Type 1 diabetes mellitus with hyperglycemia: Secondary | ICD-10-CM | POA: Diagnosis not present

## 2019-01-04 ENCOUNTER — Encounter: Payer: Self-pay | Admitting: Physician Assistant

## 2019-01-04 ENCOUNTER — Other Ambulatory Visit: Payer: Self-pay

## 2019-01-04 ENCOUNTER — Ambulatory Visit: Payer: 59 | Admitting: Physician Assistant

## 2019-01-04 VITALS — BP 144/77 | HR 65 | Temp 97.0°F | Ht 69.0 in | Wt 202.2 lb

## 2019-01-04 DIAGNOSIS — F411 Generalized anxiety disorder: Secondary | ICD-10-CM

## 2019-01-04 DIAGNOSIS — E109 Type 1 diabetes mellitus without complications: Secondary | ICD-10-CM

## 2019-01-04 DIAGNOSIS — E1065 Type 1 diabetes mellitus with hyperglycemia: Secondary | ICD-10-CM | POA: Diagnosis not present

## 2019-01-04 DIAGNOSIS — I10 Essential (primary) hypertension: Secondary | ICD-10-CM

## 2019-01-04 LAB — BAYER DCA HB A1C WAIVED: HB A1C (BAYER DCA - WAIVED): 8.6 % — ABNORMAL HIGH

## 2019-01-04 MED ORDER — BUSPIRONE HCL 10 MG PO TABS: 10.0000 mg | ORAL_TABLET | Freq: Three times a day (TID) | ORAL | 5 refills | Status: DC

## 2019-01-04 NOTE — Progress Notes (Signed)
BP (!) 144/77   Pulse 65   Temp (!) 97 F (36.1 C) (Oral)   Ht 5' 9"  (1.753 m)   Wt 202 lb 3.2 oz (91.7 kg)   BMI 29.86 kg/m    Subjective:    Patient ID: Charles Foley, male    DOB: Feb 19, 1961, 58 y.o.   MRN: [id number]  HPI: Charles Foley is a 58 y.o. male presenting on 01/04/2019 for Diabetes (3 month follow up) and Hypertension  Patient comes in for recheck on his diabetes and hypertension.  He has an appointment later today by telephone with endocrinology.  2 months ago they did some adjusting of his insulin regimen.  And also he was able to get on a continuous glucose monitor.  He does feel that the sugars are getting a little more in control.  He is having less episodes of low blood sugar.  His blood pressure is up slightly today because he was anxious about coming into the office.  He states when he has had it checked at other times it has been doing well.  He is also having persistent anxiety related to all of the COVID-19 issues.  He still has to work and therefore is worried about exposure.  He states he is taking his Xanax 1 at bedtime and then he occasionally will take some during the day.  We are going to add BuSpar to his regimen.  He can take it up to 3 times a day, consistently or on an as needed basis.  Past Medical History:  Diagnosis Date  . Allergic rhinitis   . Anxiety   . Arthritis   . Cancer [facility]) 2012   prostate  . Chest pain   . Diabetes mellitus without complication ([place])    Relevant past medical, surgical, family and social history reviewed and updated as indicated. Interim medical history since our last visit reviewed. Allergies and medications reviewed and updated. DATA REVIEWED: CHART IN EPIC  Family History reviewed for pertinent findings.  Review of Systems  Constitutional: Negative.  Negative for appetite change and fatigue.  Eyes: Negative for pain and visual disturbance.  Respiratory: Negative.  Negative for cough, chest tightness, shortness of  breath and wheezing.   Cardiovascular: Negative.  Negative for chest pain, palpitations and leg swelling.  Gastrointestinal: Negative.  Negative for abdominal pain, diarrhea, nausea and vomiting.  Genitourinary: Negative.   Skin: Negative.  Negative for color change and rash.  Neurological: Negative.  Negative for weakness, numbness and headaches.  Psychiatric/Behavioral: Negative.     Allergies as of 01/04/2019   No Known Allergies     Medication List       Accurate as of January 04, 2019  9:48 AM. Always use your most recent med list.        acetaminophen 325 MG tablet Commonly known as:  TYLENOL Take 650 mg by mouth every 6 (six) hours as needed for pain.   Acetaminophen-Codeine 300-30 MG tablet Commonly known as:  TYLENOL/CODEINE #3 Take 1-2 tablets by mouth every 6 (six) hours as needed for pain.   ALPRAZolam 1 MG tablet Commonly known as:  XANAX Take 1 tablet (1 mg total) by mouth at bedtime as needed. for sleep   atorvastatin 10 MG tablet Commonly known as:  LIPITOR Take 1 tablet (10 mg total) by mouth daily.   Biotin 1000 MCG tablet Take 1,000 mcg by mouth 3 (three) times daily.   busPIRone 10 MG tablet Commonly known as:  BUSPAR Take 1  tablet (10 mg total) by mouth 3 (three) times daily.   cyclobenzaprine 10 MG tablet Commonly known as:  FLEXERIL Take 1 tablet (10 mg total) by mouth 3 (three) times daily as needed for muscle spasms.   ibuprofen 200 MG tablet Commonly known as:  ADVIL,MOTRIN Take 200 mg by mouth every 6 (six) hours as needed for pain.   Insulin Degludec 200 UNIT/ML Sopn Inject into the skin.   Tyler Aas FlexTouch 200 UNIT/ML Sopn Generic drug:  Insulin Degludec   insulin lispro 100 UNIT/ML KwikPen Commonly known as:  HumaLOG KwikPen INJECT UP TO 20 UNITS TIS BEFORE MEALS   levothyroxine 25 MCG tablet Commonly known as:  SYNTHROID, LEVOTHROID TAKE (1) TABLET DAILY BEFORE BREAKFAST.   lisinopril 20 MG tablet Commonly known as:   PRINIVIL,ZESTRIL Take 1 tablet (20 mg total) by mouth daily.   loratadine 10 MG tablet Commonly known as:  CLARITIN Take 10 mg by mouth daily.   meloxicam 7.5 MG tablet Commonly known as:  MOBIC TAKE 1 TABLET DAILY   multivitamin tablet Take 1 tablet by mouth daily.   ONE TOUCH ULTRA TEST test strip Generic drug:  glucose blood CHECK BLOOD SUGAR 1 TO 2 TIMES DAILY OR AS DIRECTED   pantoprazole 40 MG tablet Commonly known as:  PROTONIX Take 40 mg by mouth daily.          Objective:    BP (!) 144/77   Pulse 65   Temp (!) 97 F (36.1 C) (Oral)   Ht 5' 9"  (1.753 m)   Wt 202 lb 3.2 oz (91.7 kg)   BMI 29.86 kg/m   No Known Allergies  Wt Readings from Last 3 Encounters:  01/04/19 202 lb 3.2 oz (91.7 kg)  10/01/18 202 lb (91.6 kg)  07/27/18 199 lb 12.8 oz (90.6 kg)    Physical Exam Vitals signs and nursing note reviewed.  Constitutional:      General: He is not in acute distress.    Appearance: He is well-developed.  HENT:     Head: Normocephalic and atraumatic.  Eyes:     Conjunctiva/sclera: Conjunctivae normal.     Pupils: Pupils are equal, round, and reactive to light.  Cardiovascular:     Rate and Rhythm: Normal rate and regular rhythm.     Heart sounds: Normal heart sounds.  Pulmonary:     Effort: Pulmonary effort is normal. No respiratory distress.     Breath sounds: Normal breath sounds.  Skin:    General: Skin is warm and dry.  Psychiatric:        Behavior: Behavior normal.     Results for orders placed or performed in visit on 10/01/18  CBC with Differential/Platelet  Result Value Ref Range   WBC 6.0 3.4 - 10.8 x10E3/uL   RBC 4.54 4.14 - 5.80 x10E6/uL   Hemoglobin 13.9 13.0 - 17.7 g/dL   Hematocrit 40.9 37.5 - 51.0 %   MCV 90 79 - 97 fL   MCH 30.6 26.6 - 33.0 pg   MCHC 34.0 31.5 - 35.7 g/dL   RDW 12.7 11.6 - 15.4 %   Platelets 281 150 - 450 x10E3/uL   Neutrophils 45 Not Estab. %   Lymphs 44 Not Estab. %   Monocytes 8 Not Estab. %   Eos  2 Not Estab. %   Basos 1 Not Estab. %   Neutrophils Absolute 2.7 1.4 - 7.0 x10E3/uL   Lymphocytes Absolute 2.6 0.7 - 3.1 x10E3/uL   Monocytes Absolute 0.5 0.1 -  0.9 x10E3/uL   EOS (ABSOLUTE) 0.1 0.0 - 0.4 x10E3/uL   Basophils Absolute 0.0 0.0 - 0.2 x10E3/uL   Immature Granulocytes 0 Not Estab. %   Immature Grans (Abs) 0.0 0.0 - 0.1 x10E3/uL  CMP14+EGFR  Result Value Ref Range   Glucose 124 (H) 65 - 99 mg/dL   BUN 19 6 - 24 mg/dL   Creatinine, Ser 0.83 0.76 - 1.27 mg/dL   GFR calc non Af Amer 96 >59 mL/min/1.73   GFR calc Af Amer 111 >59 mL/min/1.73   BUN/Creatinine Ratio 23 (H) 9 - 20   Sodium 142 134 - 144 mmol/L   Potassium 5.6 (H) 3.5 - 5.2 mmol/L   Chloride 106 96 - 106 mmol/L   CO2 22 20 - 29 mmol/L   Calcium 10.0 8.7 - 10.2 mg/dL   Total Protein 7.1 6.0 - 8.5 g/dL   Albumin 4.4 3.5 - 5.5 g/dL   Globulin, Total 2.7 1.5 - 4.5 g/dL   Albumin/Globulin Ratio 1.6 1.2 - 2.2   Bilirubin Total 0.8 0.0 - 1.2 mg/dL   Alkaline Phosphatase 60 39 - 117 IU/L   AST 20 0 - 40 IU/L   ALT 30 0 - 44 IU/L  Lipid panel  Result Value Ref Range   Cholesterol, Total 137 100 - 199 mg/dL   Triglycerides 38 0 - 149 mg/dL   HDL 58 >39 mg/dL   VLDL Cholesterol Cal 8 5 - 40 mg/dL   LDL Calculated 71 0 - 99 mg/dL   Chol/HDL Ratio 2.4 0.0 - 5.0 ratio  TSH  Result Value Ref Range   TSH 1.620 0.450 - 4.500 uIU/mL  Bayer DCA Hb A1c Waived  Result Value Ref Range   HB A1C (BAYER DCA - WAIVED) 10.2 (H) <7.0 %      Assessment & Plan:   1. Type 1 diabetes mellitus without complication (HCC) - Insulin Degludec 200 UNIT/ML SOPN; Inject into the skin. - TRESIBA FLEXTOUCH 200 UNIT/ML SOPN - Bayer DCA Hb A1c Waived Continue treatment with endocrinology  2. GAD (generalized anxiety disorder) - busPIRone (BUSPAR) 10 MG tablet; Take 1 tablet (10 mg total) by mouth 3 (three) times daily.  Dispense: 90 tablet; Refill: 5  3. Essential hypertension Continue lisinopril 20 mg Reduce sodium in diet   Continue all other maintenance medications as listed above.  Follow up plan: Return in about 3 months (around 04/05/2019) for recheck.  Educational handout given for [place] PA-C [place] [address]  [place], [place] [address] [phone]   01/04/2019, 9:48 AM

## 2019-01-07 DIAGNOSIS — E1065 Type 1 diabetes mellitus with hyperglycemia: Secondary | ICD-10-CM | POA: Diagnosis not present

## 2019-02-09 DIAGNOSIS — E1065 Type 1 diabetes mellitus with hyperglycemia: Secondary | ICD-10-CM | POA: Diagnosis not present

## 2019-02-15 DIAGNOSIS — E1065 Type 1 diabetes mellitus with hyperglycemia: Secondary | ICD-10-CM | POA: Diagnosis not present

## 2019-02-25 ENCOUNTER — Other Ambulatory Visit: Payer: Self-pay | Admitting: Physician Assistant

## 2019-03-03 ENCOUNTER — Other Ambulatory Visit: Payer: Self-pay | Admitting: Physician Assistant

## 2019-03-30 ENCOUNTER — Encounter: Payer: Self-pay | Admitting: Physician Assistant

## 2019-03-30 ENCOUNTER — Other Ambulatory Visit: Payer: Self-pay

## 2019-03-30 ENCOUNTER — Ambulatory Visit: Payer: 59 | Admitting: Physician Assistant

## 2019-03-30 ENCOUNTER — Other Ambulatory Visit: Payer: Self-pay | Admitting: Physician Assistant

## 2019-03-30 VITALS — BP 131/72 | HR 78 | Temp 97.3°F | Ht 69.0 in | Wt 198.8 lb

## 2019-03-30 DIAGNOSIS — E109 Type 1 diabetes mellitus without complications: Secondary | ICD-10-CM | POA: Diagnosis not present

## 2019-03-30 DIAGNOSIS — F411 Generalized anxiety disorder: Secondary | ICD-10-CM

## 2019-03-30 DIAGNOSIS — L84 Corns and callosities: Secondary | ICD-10-CM | POA: Insufficient documentation

## 2019-03-30 DIAGNOSIS — Z125 Encounter for screening for malignant neoplasm of prostate: Secondary | ICD-10-CM | POA: Diagnosis not present

## 2019-03-30 DIAGNOSIS — E039 Hypothyroidism, unspecified: Secondary | ICD-10-CM | POA: Diagnosis not present

## 2019-03-30 DIAGNOSIS — L219 Seborrheic dermatitis, unspecified: Secondary | ICD-10-CM

## 2019-03-30 LAB — BAYER DCA HB A1C WAIVED: HB A1C (BAYER DCA - WAIVED): 9.7 % — ABNORMAL HIGH (ref <7.0)

## 2019-03-30 MED ORDER — CLOBETASOL PROPIONATE 0.05 % EX SOLN: 1.0000 "application " | Freq: Two times a day (BID) | CUTANEOUS | 0 refills | Status: DC

## 2019-03-30 NOTE — Patient Instructions (Signed)

## 2019-03-31 LAB — CBC WITH DIFFERENTIAL/PLATELET
Basophils Absolute: 0.1 10*3/uL (ref 0.0–0.2)
Basos: 1 %
EOS (ABSOLUTE): 0.2 10*3/uL (ref 0.0–0.4)
Eos: 2 %
Hematocrit: 43.1 % (ref 37.5–51.0)
Hemoglobin: 15.1 g/dL (ref 13.0–17.7)
Immature Grans (Abs): 0 10*3/uL (ref 0.0–0.1)
Immature Granulocytes: 0 %
Lymphocytes Absolute: 3.4 10*3/uL — ABNORMAL HIGH (ref 0.7–3.1)
Lymphs: 38 %
MCH: 30.5 pg (ref 26.6–33.0)
MCHC: 35 g/dL (ref 31.5–35.7)
MCV: 87 fL (ref 79–97)
Monocytes Absolute: 0.6 10*3/uL (ref 0.1–0.9)
Monocytes: 7 %
Neutrophils Absolute: 4.7 10*3/uL (ref 1.4–7.0)
Neutrophils: 52 %
Platelets: 309 10*3/uL (ref 150–450)
RBC: 4.95 x10E6/uL (ref 4.14–5.80)
RDW: 11.9 % (ref 11.6–15.4)
WBC: 8.9 10*3/uL (ref 3.4–10.8)

## 2019-03-31 LAB — PSA: Prostate Specific Ag, Serum: <0.1 ng/mL (ref 0.0–4.0)

## 2019-03-31 LAB — CMP14+EGFR
ALT: 33 IU/L (ref 0–44)
AST: 27 IU/L (ref 0–40)
Albumin/Globulin Ratio: 1.7 (ref 1.2–2.2)
Albumin: 4.8 g/dL (ref 3.8–4.9)
Alkaline Phosphatase: 67 IU/L (ref 39–117)
BUN/Creatinine Ratio: 22 (ref 10–24)
BUN: 20 mg/dL (ref 8–27)
Bilirubin Total: 1 mg/dL (ref 0.0–1.2)
CO2: 24 mmol/L (ref 20–29)
Calcium: 10.7 mg/dL — ABNORMAL HIGH (ref 8.6–10.2)
Chloride: 100 mmol/L (ref 96–106)
Creatinine, Ser: 0.9 mg/dL (ref 0.76–1.27)
GFR calc Af Amer: 107 mL/min/{1.73_m2} (ref >59)
GFR calc non Af Amer: 93 mL/min/{1.73_m2} (ref >59)
Globulin, Total: 2.8 g/dL (ref 1.5–4.5)
Glucose: 76 mg/dL (ref 65–99)
Potassium: 5.1 mmol/L (ref 3.5–5.2)
Sodium: 139 mmol/L (ref 134–144)
Total Protein: 7.6 g/dL (ref 6.0–8.5)

## 2019-03-31 LAB — LIPID PANEL
Chol/HDL Ratio: 3 ratio (ref 0.0–5.0)
Cholesterol, Total: 154 mg/dL (ref 100–199)
HDL: 52 mg/dL (ref >39)
LDL Calculated: 90 mg/dL (ref 0–99)
Triglycerides: 62 mg/dL (ref 0–149)
VLDL Cholesterol Cal: 12 mg/dL (ref 5–40)

## 2019-04-04 NOTE — Progress Notes (Signed)
BP 131/72   Pulse 78   Temp (!) 97.3 F (36.3 C) (Oral)   Ht 5' 9"  (1.753 m)   Wt 198 lb 12.8 oz (90.2 kg)   BMI 29.36 kg/m    Subjective:    Patient ID: Charles Foley, male    DOB: 07/31/1961, 58 y.o.   MRN: [id number]  HPI: Charles Foley is a 58 y.o. male presenting on 03/30/2019 for Blister (on bottom of left foot)  Patient comes in for follow-up on his chronic medical conditions which include hypothyroidism, type 1 diabetes uncontrolled, labs will be performed today.    He is also having the complaint of pain in the mid right foot that has even developed a blister.  It has calmed down now but still has a significant amount of tenderness deep into his foot.  He stands all day and does wear steel toed shoes in his job.  He does go up and down stairs.  He has had no direct injury to the skin of the foot.  We will plan on podiatry evaluation particularly because of his diabetic status.  He also is having a flareup of seborrhea on the scalp.  He uses medicated shampoos but it is not keeping it calm at this time.  He would like to have a steroid solution to calm it down.    Past Medical History:  Diagnosis Date  . Allergic rhinitis   . Anxiety   . Arthritis   . Cancer [facility]) 2012   prostate  . Chest pain   . Diabetes mellitus without complication ([place])    Relevant past medical, surgical, family and social history reviewed and updated as indicated. Interim medical history since our last visit reviewed. Allergies and medications reviewed and updated. DATA REVIEWED: CHART IN EPIC  Family History reviewed for pertinent findings.  Review of Systems  Constitutional: Negative.  Negative for appetite change and fatigue.  Eyes: Negative for pain and visual disturbance.  Respiratory: Negative.  Negative for cough, chest tightness, shortness of breath and wheezing.   Cardiovascular: Negative.  Negative for chest pain, palpitations and leg swelling.  Gastrointestinal: Negative.  Negative  for abdominal pain, diarrhea, nausea and vomiting.  Genitourinary: Negative.   Musculoskeletal: Positive for arthralgias and gait problem.  Skin: Negative.  Negative for color change and rash.  Neurological: Negative for weakness, numbness and headaches.  Psychiatric/Behavioral: Negative.     Allergies as of 03/30/2019   No Known Allergies     Medication List       Accurate as of March 30, 2019 11:59 PM. If you have any questions, ask your nurse or doctor.        acetaminophen 325 MG tablet Commonly known as: TYLENOL Take 650 mg by mouth every 6 (six) hours as needed for pain.   Acetaminophen-Codeine 300-30 MG tablet Commonly known as: TYLENOL/CODEINE #3 Take 1-2 tablets by mouth every 6 (six) hours as needed for pain.   ALPRAZolam 1 MG tablet Commonly known as: XANAX Take 1 tablet (1 mg total) by mouth at bedtime as needed. for sleep   atorvastatin 10 MG tablet Commonly known as: LIPITOR Take 1 tablet (10 mg total) by mouth daily.   Biotin 1000 MCG tablet Take 1,000 mcg by mouth 3 (three) times daily.   busPIRone 10 MG tablet Commonly known as: BUSPAR Take 1 tablet (10 mg total) by mouth 3 (three) times daily.   clobetasol 0.05 % external solution Commonly known as: TEMOVATE Apply 1 application topically 2 (  two) times daily. Started by: Terald Sleeper, PA-C   cyclobenzaprine 10 MG tablet Commonly known as: FLEXERIL Take 1 tablet (10 mg total) by mouth 3 (three) times daily as needed for muscle spasms.   ibuprofen 200 MG tablet Commonly known as: ADVIL Take 200 mg by mouth every 6 (six) hours as needed for pain.   Insulin Degludec 200 UNIT/ML Sopn Inject into the skin.   Tyler Aas FlexTouch 200 UNIT/ML Sopn Generic drug: Insulin Degludec   insulin lispro 100 UNIT/ML KwikPen Commonly known as: HumaLOG KwikPen INJECT UP TO 20 UNITS TIS BEFORE MEALS   levothyroxine 25 MCG tablet Commonly known as: SYNTHROID TAKE (1) TABLET DAILY BEFORE BREAKFAST.    lisinopril 20 MG tablet Commonly known as: ZESTRIL Take 1 tablet (20 mg total) by mouth daily.   loratadine 10 MG tablet Commonly known as: CLARITIN Take 10 mg by mouth daily.   meloxicam 7.5 MG tablet Commonly known as: MOBIC TAKE 1 TABLET DAILY   multivitamin tablet Take 1 tablet by mouth daily.   ONE TOUCH ULTRA TEST test strip Generic drug: glucose blood CHECK BLOOD SUGAR 1 TO 2 TIMES DAILY OR AS DIRECTED   pantoprazole 40 MG tablet Commonly known as: PROTONIX Take 40 mg by mouth daily.          Objective:    BP 131/72   Pulse 78   Temp (!) 97.3 F (36.3 C) (Oral)   Ht 5' 9"  (1.753 m)   Wt 198 lb 12.8 oz (90.2 kg)   BMI 29.36 kg/m   No Known Allergies  Wt Readings from Last 3 Encounters:  03/30/19 198 lb 12.8 oz (90.2 kg)  01/04/19 202 lb 3.2 oz (91.7 kg)  10/01/18 202 lb (91.6 kg)    Physical Exam Vitals signs and nursing note reviewed.  Constitutional:      General: He is not in acute distress.    Appearance: He is well-developed.  HENT:     Head: Normocephalic and atraumatic.  Eyes:     Conjunctiva/sclera: Conjunctivae normal.     Pupils: Pupils are equal, round, and reactive to light.  Cardiovascular:     Rate and Rhythm: Normal rate and regular rhythm.     Heart sounds: Normal heart sounds.  Pulmonary:     Effort: Pulmonary effort is normal. No respiratory distress.     Breath sounds: Normal breath sounds.  Musculoskeletal:     Right foot: Normal range of motion. No tenderness, bony tenderness, swelling or deformity.       Feet:     Comments: Can with slight thickening redness and tender to the touch.  Is approximately at the fold metatarsal area.  His pulses are good, skin has good color.  Skin:    General: Skin is warm and dry.  Psychiatric:        Behavior: Behavior normal.     Results for orders placed or performed in visit on 03/30/19  CBC with Differential/Platelet  Result Value Ref Range   WBC 8.9 3.4 - 10.8 x10E3/uL   RBC  4.95 4.14 - 5.80 x10E6/uL   Hemoglobin 15.1 13.0 - 17.7 g/dL   Hematocrit 43.1 37.5 - 51.0 %   MCV 87 79 - 97 fL   MCH 30.5 26.6 - 33.0 pg   MCHC 35.0 31.5 - 35.7 g/dL   RDW 11.9 11.6 - 15.4 %   Platelets 309 150 - 450 x10E3/uL   Neutrophils 52 Not Estab. %   Lymphs 38 Not Estab. %  Monocytes 7 Not Estab. %   Eos 2 Not Estab. %   Basos 1 Not Estab. %   Neutrophils Absolute 4.7 1.4 - 7.0 x10E3/uL   Lymphocytes Absolute 3.4 (H) 0.7 - 3.1 x10E3/uL   Monocytes Absolute 0.6 0.1 - 0.9 x10E3/uL   EOS (ABSOLUTE) 0.2 0.0 - 0.4 x10E3/uL   Basophils Absolute 0.1 0.0 - 0.2 x10E3/uL   Immature Granulocytes 0 Not Estab. %   Immature Grans (Abs) 0.0 0.0 - 0.1 x10E3/uL  CMP14+EGFR  Result Value Ref Range   Glucose 76 65 - 99 mg/dL   BUN 20 8 - 27 mg/dL   Creatinine, Ser 0.90 0.76 - 1.27 mg/dL   GFR calc non Af Amer 93 >59 mL/min/1.73   GFR calc Af Amer 107 >59 mL/min/1.73   BUN/Creatinine Ratio 22 10 - 24   Sodium 139 134 - 144 mmol/L   Potassium 5.1 3.5 - 5.2 mmol/L   Chloride 100 96 - 106 mmol/L   CO2 24 20 - 29 mmol/L   Calcium 10.7 (H) 8.6 - 10.2 mg/dL   Total Protein 7.6 6.0 - 8.5 g/dL   Albumin 4.8 3.8 - 4.9 g/dL   Globulin, Total 2.8 1.5 - 4.5 g/dL   Albumin/Globulin Ratio 1.7 1.2 - 2.2   Bilirubin Total 1.0 0.0 - 1.2 mg/dL   Alkaline Phosphatase 67 39 - 117 IU/L   AST 27 0 - 40 IU/L   ALT 33 0 - 44 IU/L  Lipid panel  Result Value Ref Range   Cholesterol, Total 154 100 - 199 mg/dL   Triglycerides 62 0 - 149 mg/dL   HDL 52 >39 mg/dL   VLDL Cholesterol Cal 12 5 - 40 mg/dL   LDL Calculated 90 0 - 99 mg/dL   Chol/HDL Ratio 3.0 0.0 - 5.0 ratio  Bayer DCA Hb A1c Waived  Result Value Ref Range   HB A1C (BAYER DCA - WAIVED) 9.7 (H) <7.0 %  PSA  Result Value Ref Range   Prostate Specific Ag, Serum <0.1 0.0 - 4.0 ng/mL      Assessment & Plan:   1. Callus of foot - Ambulatory referral to Podiatry  2. Type 1 diabetes mellitus without complication (HCC) - Ambulatory referral  to Podiatry - CBC with Differential/Platelet - CMP14+EGFR - Lipid panel - Bayer DCA Hb A1c Waived  3. Hypothyroidism, unspecified type - CBC with Differential/Platelet - CMP14+EGFR - Lipid panel - Bayer DCA Hb A1c Waived  4. Screening for prostate cancer - PSA  5. Seborrhea - clobetasol (TEMOVATE) 0.05 % external solution; Apply 1 application topically 2 (two) times daily.  Dispense: 50 mL; Refill: 0   Continue all other maintenance medications as listed above.  Follow up plan: Return in about 3 months (around 06/30/2019).  Educational handout given for callus  Terald Sleeper PA-C [place] [address]  [place], [place] [address] [phone]   04/04/2019, 2:29 PM

## 2019-04-05 ENCOUNTER — Ambulatory Visit: Payer: 59 | Admitting: Physician Assistant

## 2019-04-11 ENCOUNTER — Encounter: Payer: 59 | Admitting: Physician Assistant

## 2019-05-31 ENCOUNTER — Other Ambulatory Visit: Payer: Self-pay | Admitting: *Deleted

## 2019-05-31 MED ORDER — PANTOPRAZOLE SODIUM 40 MG PO TBEC: 40.0000 mg | DELAYED_RELEASE_TABLET | Freq: Every day | ORAL | 3 refills | Status: DC

## 2019-07-04 ENCOUNTER — Ambulatory Visit (INDEPENDENT_AMBULATORY_CARE_PROVIDER_SITE_OTHER): Payer: 59 | Admitting: Physician Assistant

## 2019-07-04 ENCOUNTER — Encounter: Payer: Self-pay | Admitting: Physician Assistant

## 2019-07-04 ENCOUNTER — Other Ambulatory Visit: Payer: Self-pay | Admitting: Physician Assistant

## 2019-07-04 DIAGNOSIS — E109 Type 1 diabetes mellitus without complications: Secondary | ICD-10-CM | POA: Diagnosis not present

## 2019-07-04 DIAGNOSIS — J0100 Acute maxillary sinusitis, unspecified: Secondary | ICD-10-CM

## 2019-07-04 DIAGNOSIS — I10 Essential (primary) hypertension: Secondary | ICD-10-CM | POA: Diagnosis not present

## 2019-07-04 MED ORDER — AMOXICILLIN 500 MG PO CAPS: 500.0000 mg | ORAL_CAPSULE | Freq: Three times a day (TID) | ORAL | 0 refills | Status: DC

## 2019-07-04 MED ORDER — LISINOPRIL 20 MG PO TABS: 20.0000 mg | ORAL_TABLET | Freq: Every day | ORAL | 5 refills | Status: DC

## 2019-07-04 MED ORDER — ATORVASTATIN CALCIUM 10 MG PO TABS: 10.0000 mg | ORAL_TABLET | Freq: Every day | ORAL | 5 refills | Status: DC

## 2019-07-04 NOTE — Progress Notes (Signed)
Telephone visit  Subjective: CC: Chronic recheck, sinus PCP: Terald Sleeper, PA-C [id number] Charles Foley is a 58 y.o. male calls for telephone consult today. Patient provides verbal consent for consult held via phone.  Patient is identified with 2 separate identifiers.  At this time the entire area is on COVID-19 social distancing and stay home orders are in place.  Patient is of higher risk and therefore we are performing this by a virtual method.  Location of patient: Home Location of provider: [facility] Others present for call: No  This patient has started with a significant cold and sinus infection over the last few days.  His visit is trended to a telephone visit  This patient has had many days of sinus headache and postnasal drainage. There is copious drainage at times. Denies any fever at this time. There has been a history of sinus infections in the past.  No history of sinus surgery. There is cough at night. It has become more prevalent in recent days.  He is also having a recheck on his chronic medical conditions which do include diabetesThat has been difficult to control.  He would like to go to a different endocrinologist or dietitian.  He has a main of someone who recommended by coworker.  He will get that information to [place].  He was not able to go to the dietitian through the other endocrinologist because of the cost.  He is working hard on reducing carbs daily.  He has information from the pharmacist concerning diabetic diet and carb counting.    ROS: Per HPI  No Known Allergies Past Medical History:  Diagnosis Date  . Allergic rhinitis   . Anxiety   . Arthritis   . Cancer [facility]) 2012   prostate  . Chest pain   . Diabetes mellitus without complication ([place])     Current Outpatient Medications:  .  acetaminophen (TYLENOL) 325 MG tablet, Take 650 mg by mouth every 6 (six) hours as needed for pain., Disp: , Rfl:  .  Acetaminophen-Codeine (TYLENOL/CODEINE #3) 300-30 MG  tablet, Take 1-2 tablets by mouth every 6 (six) hours as needed for pain., Disp: 40 tablet, Rfl: 5 .  ALPRAZolam (XANAX) 1 MG tablet, Take 1 tablet (1 mg total) by mouth at bedtime as needed. for sleep, Disp: 30 tablet, Rfl: 5 .  amoxicillin (AMOXIL) 500 MG capsule, Take 1 capsule (500 mg total) by mouth 3 (three) times daily., Disp: 30 capsule, Rfl: 0 .  atorvastatin (LIPITOR) 10 MG tablet, Take 1 tablet (10 mg total) by mouth daily., Disp: 30 tablet, Rfl: 5 .  Biotin 1000 MCG tablet, Take 1,000 mcg by mouth 3 (three) times daily., Disp: , Rfl:  .  busPIRone (BUSPAR) 10 MG tablet, Take 1 tablet (10 mg total) by mouth 3 (three) times daily., Disp: 90 tablet, Rfl: 5 .  clobetasol (TEMOVATE) 0.05 % external solution, Apply 1 application topically 2 (two) times daily., Disp: 50 mL, Rfl: 0 .  cyclobenzaprine (FLEXERIL) 10 MG tablet, Take 1 tablet (10 mg total) by mouth 3 (three) times daily as needed for muscle spasms., Disp: 90 tablet, Rfl: 5 .  ibuprofen (ADVIL,MOTRIN) 200 MG tablet, Take 200 mg by mouth every 6 (six) hours as needed for pain., Disp: , Rfl:  .  Insulin Degludec 200 UNIT/ML SOPN, Inject into the skin., Disp: , Rfl:  .  insulin lispro (HUMALOG KWIKPEN) 100 UNIT/ML KwikPen, INJECT UP TO 20 UNITS TIS BEFORE MEALS, Disp: 30 mL, Rfl:  5 .  levothyroxine (SYNTHROID, LEVOTHROID) 25 MCG tablet, TAKE (1) TABLET DAILY BEFORE BREAKFAST., Disp: 30 tablet, Rfl: 10 .  lisinopril (ZESTRIL) 20 MG tablet, Take 1 tablet (20 mg total) by mouth daily., Disp: 30 tablet, Rfl: 5 .  loratadine (CLARITIN) 10 MG tablet, Take 10 mg by mouth daily., Disp: , Rfl:  .  meloxicam (MOBIC) 7.5 MG tablet, TAKE 1 TABLET DAILY, Disp: 30 tablet, Rfl: 11 .  Multiple Vitamin (MULTIVITAMIN) tablet, Take 1 tablet by mouth daily., Disp: , Rfl:  .  ONE TOUCH ULTRA TEST test strip, CHECK BLOOD SUGAR 1 TO 2 TIMES DAILY OR AS DIRECTED, Disp: 200 each, Rfl: 11 .  pantoprazole (PROTONIX) 40 MG tablet, Take 1 tablet (40 mg total) by  mouth daily., Disp: 90 tablet, Rfl: 3 .  TRESIBA FLEXTOUCH 200 UNIT/ML SOPN, , Disp: , Rfl:   Assessment/ Plan: 58 y.o. male   1. Essential hypertension - lisinopril (ZESTRIL) 20 MG tablet; Take 1 tablet (20 mg total) by mouth daily.  Dispense: 30 tablet; Refill: 5  2. Type 1 diabetes mellitus without complication (HCC) - atorvastatin (LIPITOR) 10 MG tablet; Take 1 tablet (10 mg total) by mouth daily.  Dispense: 30 tablet; Refill: 5  3. Acute non-recurrent maxillary sinusitis - amoxicillin (AMOXIL) 500 MG capsule; Take 1 capsule (500 mg total) by mouth 3 (three) times daily.  Dispense: 30 capsule; Refill: 0   Follow-up in 6 weeks  Continue all other maintenance medications as listed above.  Start time: 8:03 AM End time: 8:15 AM  Meds ordered this encounter  Medications  . amoxicillin (AMOXIL) 500 MG capsule    Sig: Take 1 capsule (500 mg total) by mouth 3 (three) times daily.    Dispense:  30 capsule    Refill:  0    Order Specific Question:   Supervising Provider    Answer:   Janora Norlander [id number]  . atorvastatin (LIPITOR) 10 MG tablet    Sig: Take 1 tablet (10 mg total) by mouth daily.    Dispense:  30 tablet    Refill:  5    Order Specific Question:   Supervising Provider    Answer:   Janora Norlander [id number]  . lisinopril (ZESTRIL) 20 MG tablet    Sig: Take 1 tablet (20 mg total) by mouth daily.    Dispense:  30 tablet    Refill:  5    Order Specific Question:   Supervising Provider    Answer:   Janora Norlander [id number]    Particia Nearing PA-C [place] [phone]

## 2019-07-18 ENCOUNTER — Encounter: Payer: Self-pay | Admitting: Family Medicine

## 2019-07-18 ENCOUNTER — Ambulatory Visit (INDEPENDENT_AMBULATORY_CARE_PROVIDER_SITE_OTHER): Payer: 59

## 2019-07-18 ENCOUNTER — Ambulatory Visit: Payer: 59 | Admitting: Family Medicine

## 2019-07-18 ENCOUNTER — Other Ambulatory Visit: Payer: Self-pay

## 2019-07-18 VITALS — BP 131/69 | HR 63 | Temp 98.7°F | Resp 20 | Ht 69.0 in | Wt 198.0 lb

## 2019-07-18 DIAGNOSIS — S20211A Contusion of right front wall of thorax, initial encounter: Secondary | ICD-10-CM | POA: Diagnosis not present

## 2019-07-18 DIAGNOSIS — S2231XA Fracture of one rib, right side, initial encounter for closed fracture: Secondary | ICD-10-CM

## 2019-07-18 MED ORDER — NABUMETONE 500 MG PO TABS: 1000.0000 mg | ORAL_TABLET | Freq: Two times a day (BID) | ORAL | 1 refills | Status: DC

## 2019-07-18 NOTE — Progress Notes (Addendum)
Chief Complaint  Patient presents with  . Fall    from ladder - 1 week ago (fall 8-10 ft down) R rib pain and right elbow pain     HPI  Patient presents today for follow-up from his recent fall.  He fell off of a ladder on October 16.  He went to the emergency room.  He had a lot of x-rays done.  No fractures were noted.  He continues to have pain at the right posterior axillary line region around to the midclavicular line on the right at approximately the rib 10 level.  The pain is excruciating.  It is getting worse instead of better with time.  It hurts to take a deep breath.  It hurts to cough and it is particularly painful if he has to sneeze.  He denies shortness of breath.  He has not had hemoptysis.  No fever chills or sweats.  PMH: Smoking status noted ROS: Per HPI  Objective: BP 131/69 (BP Location: Left Arm, Cuff Size: Normal)   Pulse 63   Temp 98.7 F (37.1 C)   Resp 20   Ht 5\' 9"  (1.753 m)   Wt 198 lb (89.8 kg)   SpO2 98%   BMI 29.24 kg/m  Gen: NAD, alert, cooperative with exam HEENT: NCAT, EOMI, PERRL CV: RRR, good S1/S2, no murmur Resp: CTABL, no wheezes, non-labored Abd: tender at midclavicular line on the right at level of 9-10 ribs.  Ext: No edema, warm Neuro: Alert and oriented, No gross deficits XR - no fx noted. Lungs inflated  Preliminary reading done by Randell Loop  Assessment and plan:  1. Contusion of rib on right side, initial encounter     Meds ordered this encounter  Medications  . nabumetone (RELAFEN) 500 MG tablet    Sig: Take 2 tablets (1,000 mg total) by mouth 2 (two) times daily. For muscle and joint pain    Dispense:  360 tablet    Refill:  1    Orders Placed This Encounter  Procedures  . DG Ribs Unilateral W/Chest Right    Standing Status:   Future    Number of Occurrences:   1    Standing Expiration Date:   09/16/2020    Order Specific Question:   Reason for Exam (SYMPTOM  OR DIAGNOSIS REQUIRED)    Answer:   pain after a  fall on 10/16    Order Specific Question:   Preferred imaging location?    Answer:   Internal    Follow up as needed.  Claretta Fraise, MD  Addend: 10/27  XR shows displaced posterolateral rib fx. I discussed with patient. He is at high risk for lung puncture and pneumothorax  until this fracture heals. I took him out of work for a month so he can avoid heavy lifting, bending an twisting.He will file FMLA and short term disability as a result.

## 2019-08-10 ENCOUNTER — Other Ambulatory Visit: Payer: Self-pay

## 2019-08-11 ENCOUNTER — Encounter: Payer: Self-pay | Admitting: Family Medicine

## 2019-08-11 ENCOUNTER — Ambulatory Visit: Payer: 59 | Admitting: Family Medicine

## 2019-08-11 ENCOUNTER — Ambulatory Visit (INDEPENDENT_AMBULATORY_CARE_PROVIDER_SITE_OTHER): Payer: 59

## 2019-08-11 VITALS — BP 154/80 | HR 61 | Temp 99.1°F | Ht 69.0 in | Wt 201.0 lb

## 2019-08-11 DIAGNOSIS — S2239XD Fracture of one rib, unspecified side, subsequent encounter for fracture with routine healing: Secondary | ICD-10-CM

## 2019-08-11 DIAGNOSIS — S2239XA Fracture of one rib, unspecified side, initial encounter for closed fracture: Secondary | ICD-10-CM | POA: Diagnosis not present

## 2019-08-11 NOTE — Progress Notes (Signed)
Chief Complaint  Patient presents with  . fractured rib  . Back Pain    HPI  Patient presents today for recheck of rib fracture. Now 3 weeks out. Pain is moderate. Locate under the scapula as well as anterior costal margin on the right. It is getting better. Worse with cough or sneeze. Hurts to lift. Concerned about resuming work  PMH: Smoking status noted ROS: Per HPI  Objective: BP (!) 154/80   Pulse 61   Temp 99.1 F (37.3 C) (Oral)   Ht 5\' 9"  (1.753 m)   Wt 201 lb (91.2 kg)   SpO2 97%   BMI 29.68 kg/m  Gen: NAD, alert, cooperative with exam HEENT: NCAT, EOMI, PERRL CV: RRR, good S1/S2, no murmur Resp: CTABL, no wheezes, non-labored Ext: No edema, warm Tender at scapular angle and RUQ costal  at mid clavicular line. Neuro: Alert and oriented, No gross deficits XR demonstrates onset of healing of fracture.Preliminary reading done by Randell Loop  Assessment and plan:  1. Fracture of one rib, unsp side, init for clos fx       Orders Placed This Encounter  Procedures  . DG Ribs Unilateral W/Chest Right    Standing Status:   Future    Number of Occurrences:   1    Standing Expiration Date:   10/10/2020    Order Specific Question:   Reason for Exam (SYMPTOM  OR DIAGNOSIS REQUIRED)    Answer:   follow up right 6th posterolateral rib fx    Order Specific Question:   Preferred imaging location?    Answer:   Internal    Follow up as needed.  Claretta Fraise, MD

## 2019-08-15 ENCOUNTER — Encounter: Payer: Self-pay | Admitting: Family Medicine

## 2019-08-30 ENCOUNTER — Other Ambulatory Visit: Payer: Self-pay

## 2019-08-30 DIAGNOSIS — Z20822 Contact with and (suspected) exposure to covid-19: Secondary | ICD-10-CM

## 2019-09-01 ENCOUNTER — Telehealth: Payer: Self-pay | Admitting: *Deleted

## 2019-09-01 LAB — NOVEL CORONAVIRUS, NAA: SARS-CoV-2, NAA: DETECTED — AB

## 2019-09-01 NOTE — Telephone Encounter (Signed)
Patient called for results ,still pending . 

## 2019-09-02 ENCOUNTER — Telehealth: Payer: Self-pay | Admitting: *Deleted

## 2019-09-02 NOTE — Telephone Encounter (Signed)
Patient notified of positive COVID results. See result note.

## 2019-09-02 NOTE — Telephone Encounter (Signed)
Patient called NT concerning results , please call him at [phone].

## 2019-09-03 ENCOUNTER — Telehealth: Payer: Self-pay | Admitting: Unknown Physician Specialty

## 2019-09-03 NOTE — Telephone Encounter (Signed)
Discussed with patient about Covid symptoms and the use of bamlanivimab, a monoclonal antibody infusion for those with mild to moderate Covid symptoms and at a high risk of hospitalization.  Pt is qualified for this infusion at the [facility] infusion center due to Diabetes which were addressed with the patient and are actively being managed by a [facility] provider.    After discussing the infusion's costs, potential benefits and side effects, the patient has decided to decline treatment with monoclonal antibodies.

## 2019-09-29 ENCOUNTER — Other Ambulatory Visit: Payer: Self-pay | Admitting: Physician Assistant

## 2019-09-29 DIAGNOSIS — F411 Generalized anxiety disorder: Secondary | ICD-10-CM

## 2019-10-25 ENCOUNTER — Other Ambulatory Visit: Payer: Self-pay | Admitting: Physician Assistant

## 2019-10-25 DIAGNOSIS — E039 Hypothyroidism, unspecified: Secondary | ICD-10-CM

## 2019-11-01 ENCOUNTER — Other Ambulatory Visit: Payer: Self-pay

## 2019-11-02 ENCOUNTER — Encounter: Payer: Self-pay | Admitting: Physician Assistant

## 2019-11-02 ENCOUNTER — Ambulatory Visit: Payer: 59 | Admitting: Physician Assistant

## 2019-11-02 VITALS — BP 149/72 | HR 62 | Temp 98.4°F | Ht 69.0 in | Wt 196.4 lb

## 2019-11-02 DIAGNOSIS — F411 Generalized anxiety disorder: Secondary | ICD-10-CM

## 2019-11-02 DIAGNOSIS — E039 Hypothyroidism, unspecified: Secondary | ICD-10-CM

## 2019-11-02 DIAGNOSIS — I10 Essential (primary) hypertension: Secondary | ICD-10-CM

## 2019-11-02 DIAGNOSIS — E109 Type 1 diabetes mellitus without complications: Secondary | ICD-10-CM | POA: Diagnosis not present

## 2019-11-02 LAB — BAYER DCA HB A1C WAIVED: HB A1C (BAYER DCA - WAIVED): 9.7 % — ABNORMAL HIGH (ref <7.0)

## 2019-11-02 MED ORDER — LEVOTHYROXINE SODIUM 25 MCG PO TABS: 25.0000 ug | ORAL_TABLET | Freq: Every day | ORAL | 5 refills | Status: DC

## 2019-11-02 MED ORDER — ATORVASTATIN CALCIUM 10 MG PO TABS: 10.0000 mg | ORAL_TABLET | Freq: Every day | ORAL | 5 refills | Status: DC

## 2019-11-02 MED ORDER — BUSPIRONE HCL 10 MG PO TABS: 10.0000 mg | ORAL_TABLET | Freq: Three times a day (TID) | ORAL | 5 refills | Status: DC

## 2019-11-02 MED ORDER — ALPRAZOLAM 1 MG PO TABS: 1.0000 mg | ORAL_TABLET | Freq: Every evening | ORAL | 5 refills | Status: DC | PRN

## 2019-11-02 MED ORDER — LISINOPRIL 20 MG PO TABS: 20.0000 mg | ORAL_TABLET | Freq: Every day | ORAL | 5 refills | Status: DC

## 2019-11-06 NOTE — Progress Notes (Signed)
Acute Office Visit  Subjective:    Patient ID: Charles Foley, male    DOB: [birth year], 59 y.o.   MRN: [id number]  Chief Complaint  Patient presents with  . Medical Management of Chronic Issues    Request refills of Xanax today  . Diabetes  . Hyperlipidemia  . Hypertension    Diabetes He presents for his follow-up diabetic visit. He has type 1 diabetes mellitus. His disease course has been fluctuating. There are no hypoglycemic associated symptoms. Pertinent negatives for hypoglycemia include no headaches. There are no diabetic associated symptoms. Pertinent negatives for diabetes include no chest pain, no fatigue and no weakness. Symptoms are stable. He is following a generally healthy diet. He has had a previous visit with a dietitian. He participates in exercise daily. His home blood glucose trend is increasing steadily. An ACE inhibitor/angiotensin II receptor blocker is being taken. He does not see a podiatrist.Eye exam is current.  Hyperlipidemia This is a chronic problem. The problem is controlled. Pertinent negatives include no chest pain or shortness of breath. Current antihyperlipidemic treatment includes statins. Risk factors for coronary artery disease include diabetes mellitus, dyslipidemia, hypertension and male sex.  Hypertension This is a chronic problem. The problem has been gradually improving since onset. The problem is controlled. Pertinent negatives include no chest pain, headaches, palpitations or shortness of breath. Risk factors for coronary artery disease include diabetes mellitus, dyslipidemia and male gender. Past treatments include ACE inhibitors.     Past Medical History:  Diagnosis Date  . Allergic rhinitis   . Anxiety   . Arthritis   . Cancer [facility]) 2012   prostate  . Chest pain   . Diabetes mellitus without complication [facility])     Past Surgical History:  Procedure Laterality Date  . ARTHROPLASTY Bilateral   . ORIF FACIAL FRACTURE  1988  .  PROSTATECTOMY  2012    Family History  Problem Relation Age of Onset  . Diabetes Mother   . Heart disease Mother   . Colon cancer Neg Hx     Social History   Socioeconomic History  . Marital status: Married    Spouse name: Not on file  . Number of children: Not on file  . Years of education: Not on file  . Highest education level: Not on file  Occupational History  . Not on file  Tobacco Use  . Smoking status: Former Smoker    Quit date: 04/10/2009    Years since quitting: 10.5  . Smokeless tobacco: Former Systems developer    Quit date: 01/10/2003  Substance and Sexual Activity  . Alcohol use: Yes  . Drug use: No  . Sexual activity: Not on file  Other Topics Concern  . Not on file  Social History Narrative  . Not on file   Social Determinants of Health   Financial Resource Strain:   . Difficulty of Paying Living Expenses: Not on file  Food Insecurity:   . Worried About Charity fundraiser in the Last Year: Not on file  . Ran Out of Food in the Last Year: Not on file  Transportation Needs:   . Lack of Transportation (Medical): Not on file  . Lack of Transportation (Non-Medical): Not on file  Physical Activity:   . Days of Exercise per Week: Not on file  . Minutes of Exercise per Session: Not on file  Stress:   . Feeling of Stress : Not on file  Social Connections:   . Frequency of Communication  with Friends and Family: Not on file  . Frequency of Social Gatherings with Friends and Family: Not on file  . Attends Religious Services: Not on file  . Active Member of Clubs or Organizations: Not on file  . Attends Archivist Meetings: Not on file  . Marital Status: Not on file  Intimate Partner Violence:   . Fear of Current or Ex-Partner: Not on file  . Emotionally Abused: Not on file  . Physically Abused: Not on file  . Sexually Abused: Not on file    Outpatient Medications Prior to Visit  Medication Sig Dispense Refill  . acetaminophen (TYLENOL) 325 MG tablet  Take 650 mg by mouth every 6 (six) hours as needed for pain.    . Biotin 1000 MCG tablet Take 1,000 mcg by mouth daily.     . clobetasol (TEMOVATE) 0.05 % external solution Apply 1 application topically 2 (two) times daily. 50 mL 0  . cyclobenzaprine (FLEXERIL) 10 MG tablet Take 1 tablet (10 mg total) by mouth 3 (three) times daily as needed for muscle spasms. 90 tablet 5  . ibuprofen (ADVIL,MOTRIN) 200 MG tablet Take 200 mg by mouth every 6 (six) hours as needed for pain.    . Insulin Degludec 200 UNIT/ML SOPN Inject into the skin.    Marland Kitchen insulin lispro (HUMALOG KWIKPEN) 100 UNIT/ML KwikPen INJECT UP TO 20 UNITS TIS BEFORE MEALS 30 mL 5  . loratadine (CLARITIN) 10 MG tablet Take 10 mg by mouth daily.    . meloxicam (MOBIC) 7.5 MG tablet TAKE 1 TABLET DAILY 30 tablet 11  . Multiple Vitamin (MULTIVITAMIN) tablet Take 1 tablet by mouth daily.    . nabumetone (RELAFEN) 500 MG tablet Take 2 tablets (1,000 mg total) by mouth 2 (two) times daily. For muscle and joint pain 360 tablet 1  . ONE TOUCH ULTRA TEST test strip CHECK BLOOD SUGAR 1 TO 2 TIMES DAILY OR AS DIRECTED 200 each 11  . pantoprazole (PROTONIX) 40 MG tablet Take 1 tablet (40 mg total) by mouth daily. 90 tablet 3  . TRESIBA FLEXTOUCH 200 UNIT/ML SOPN     . Acetaminophen-Codeine (TYLENOL/CODEINE #3) 300-30 MG tablet Take 1-2 tablets by mouth every 6 (six) hours as needed for pain. 40 tablet 5  . ALPRAZolam (XANAX) 1 MG tablet Take 1 tablet (1 mg total) by mouth at bedtime as needed. for sleep 30 tablet 5  . atorvastatin (LIPITOR) 10 MG tablet Take 1 tablet (10 mg total) by mouth daily. 30 tablet 5  . busPIRone (BUSPAR) 10 MG tablet Take 1 tablet (10 mg total) by mouth 3 (three) times daily. 90 tablet 5  . levothyroxine (SYNTHROID) 25 MCG tablet TAKE (1) TABLET DAILY BEFORE BREAKFAST. 30 tablet 0  . lisinopril (ZESTRIL) 20 MG tablet Take 1 tablet (20 mg total) by mouth daily. 30 tablet 5   No facility-administered medications prior to visit.      No Known Allergies  Review of Systems  Constitutional: Negative.  Negative for appetite change and fatigue.  HENT: Negative.   Eyes: Negative.  Negative for pain and visual disturbance.  Respiratory: Negative.  Negative for cough, chest tightness, shortness of breath and wheezing.   Cardiovascular: Negative.  Negative for chest pain, palpitations and leg swelling.  Gastrointestinal: Negative.  Negative for abdominal pain, diarrhea, nausea and vomiting.  Endocrine: Negative.   Genitourinary: Negative.   Musculoskeletal: Negative.   Skin: Negative.  Negative for color change and rash.  Neurological: Negative.  Negative for  weakness, numbness and headaches.  Psychiatric/Behavioral: Negative.        Objective:    Physical Exam Constitutional:      Appearance: He is well-developed.  HENT:     Head: Normocephalic and atraumatic.  Eyes:     Conjunctiva/sclera: Conjunctivae normal.     Pupils: Pupils are equal, round, and reactive to light.  Cardiovascular:     Rate and Rhythm: Normal rate and regular rhythm.     Heart sounds: Normal heart sounds.  Pulmonary:     Effort: Pulmonary effort is normal.     Breath sounds: Normal breath sounds.  Abdominal:     General: Bowel sounds are normal.     Palpations: Abdomen is soft.  Musculoskeletal:        General: Normal range of motion.     Cervical back: Normal range of motion and neck supple.  Skin:    General: Skin is warm and dry.     BP (!) 149/72   Pulse 62   Temp 98.4 F (36.9 C)   Ht 5\' 9"  (1.753 m)   Wt 196 lb 6 oz (89.1 kg)   SpO2 97%   BMI 29.00 kg/m  Wt Readings from Last 3 Encounters:  11/02/19 196 lb 6 oz (89.1 kg)  08/11/19 201 lb (91.2 kg)  07/18/19 198 lb (89.8 kg)    Health Maintenance Due  Topic Date Due  . Hepatitis C Screening  [birth year]  . OPHTHALMOLOGY EXAM  11/02/1968  . HIV Screening  11/02/1973  . COLONOSCOPY  06/27/2016  . FOOT EXAM  08/12/2017    There are no preventive care  reminders to display for this patient.   Lab Results  Component Value Date   TSH 1.620 10/01/2018   Lab Results  Component Value Date   WBC 8.9 03/30/2019   HGB 15.1 03/30/2019   HCT 43.1 03/30/2019   MCV 87 03/30/2019   PLT 309 03/30/2019   Lab Results  Component Value Date   NA 139 03/30/2019   K 5.1 03/30/2019   CO2 24 03/30/2019   GLUCOSE 76 03/30/2019   BUN 20 03/30/2019   CREATININE 0.90 03/30/2019   BILITOT 1.0 03/30/2019   ALKPHOS 67 03/30/2019   AST 27 03/30/2019   ALT 33 03/30/2019   PROT 7.6 03/30/2019   ALBUMIN 4.8 03/30/2019   CALCIUM 10.7 (H) 03/30/2019   Lab Results  Component Value Date   CHOL 154 03/30/2019   Lab Results  Component Value Date   HDL 52 03/30/2019   Lab Results  Component Value Date   LDLCALC 90 03/30/2019   Lab Results  Component Value Date   TRIG 62 03/30/2019   Lab Results  Component Value Date   CHOLHDL 3.0 03/30/2019   Lab Results  Component Value Date   HGBA1C 9.7 (H) 11/02/2019       Assessment & Plan:   Problem List Items Addressed This Visit      Cardiovascular and Mediastinum   Essential hypertension   Relevant Medications   atorvastatin (LIPITOR) 10 MG tablet   lisinopril (ZESTRIL) 20 MG tablet     Endocrine   Type 1 diabetes mellitus without complication (HCC) - Primary   Relevant Medications   atorvastatin (LIPITOR) 10 MG tablet   lisinopril (ZESTRIL) 20 MG tablet   Other Relevant Orders   Bayer DCA Hb A1c Waived (Completed)   Hypothyroidism   Relevant Medications   levothyroxine (SYNTHROID) 25 MCG tablet     Other  GAD (generalized anxiety disorder)   Relevant Medications   ALPRAZolam (XANAX) 1 MG tablet   busPIRone (BUSPAR) 10 MG tablet       Meds ordered this encounter  Medications  . ALPRAZolam (XANAX) 1 MG tablet    Sig: Take 1 tablet (1 mg total) by mouth at bedtime as needed. for sleep    Dispense:  30 tablet    Refill:  5    Order Specific Question:   Supervising Provider     Answer:   Janora Norlander [id number]  . levothyroxine (SYNTHROID) 25 MCG tablet    Sig: Take 1 tablet (25 mcg total) by mouth daily.    Dispense:  30 tablet    Refill:  5    Order Specific Question:   Supervising Provider    Answer:   Janora Norlander [id number]  . busPIRone (BUSPAR) 10 MG tablet    Sig: Take 1 tablet (10 mg total) by mouth 3 (three) times daily.    Dispense:  90 tablet    Refill:  5    Order Specific Question:   Supervising Provider    Answer:   Janora Norlander [id number]  . atorvastatin (LIPITOR) 10 MG tablet    Sig: Take 1 tablet (10 mg total) by mouth daily.    Dispense:  30 tablet    Refill:  5    Order Specific Question:   Supervising Provider    Answer:   Janora Norlander [id number]  . lisinopril (ZESTRIL) 20 MG tablet    Sig: Take 1 tablet (20 mg total) by mouth daily.    Dispense:  30 tablet    Refill:  5    Order Specific Question:   Supervising Provider    Answer:   Janora Norlander [id number]     Terald Sleeper, PA-C

## 2019-12-08 ENCOUNTER — Other Ambulatory Visit: Payer: Self-pay | Admitting: *Deleted

## 2019-12-08 DIAGNOSIS — E109 Type 1 diabetes mellitus without complications: Secondary | ICD-10-CM

## 2019-12-08 MED ORDER — INSULIN LISPRO (1 UNIT DIAL) 100 UNIT/ML (KWIKPEN): PEN_INJECTOR | SUBCUTANEOUS | 1 refills | Status: DC

## 2020-01-04 ENCOUNTER — Encounter: Payer: Self-pay | Admitting: *Deleted

## 2020-01-31 ENCOUNTER — Encounter: Payer: Self-pay | Admitting: Family Medicine

## 2020-01-31 ENCOUNTER — Ambulatory Visit: Payer: 59 | Admitting: Physician Assistant

## 2020-01-31 ENCOUNTER — Ambulatory Visit: Payer: 59 | Admitting: Family Medicine

## 2020-01-31 ENCOUNTER — Other Ambulatory Visit: Payer: Self-pay

## 2020-01-31 VITALS — BP 146/73 | HR 72 | Temp 98.2°F | Resp 20 | Ht 69.0 in | Wt 197.0 lb

## 2020-01-31 DIAGNOSIS — Z114 Encounter for screening for human immunodeficiency virus [HIV]: Secondary | ICD-10-CM

## 2020-01-31 DIAGNOSIS — Z79899 Other long term (current) drug therapy: Secondary | ICD-10-CM | POA: Diagnosis not present

## 2020-01-31 DIAGNOSIS — E109 Type 1 diabetes mellitus without complications: Secondary | ICD-10-CM

## 2020-01-31 DIAGNOSIS — Z8546 Personal history of malignant neoplasm of prostate: Secondary | ICD-10-CM

## 2020-01-31 DIAGNOSIS — Z1159 Encounter for screening for other viral diseases: Secondary | ICD-10-CM

## 2020-01-31 DIAGNOSIS — M158 Other polyosteoarthritis: Secondary | ICD-10-CM

## 2020-01-31 DIAGNOSIS — I10 Essential (primary) hypertension: Secondary | ICD-10-CM

## 2020-01-31 DIAGNOSIS — F411 Generalized anxiety disorder: Secondary | ICD-10-CM

## 2020-01-31 DIAGNOSIS — L219 Seborrheic dermatitis, unspecified: Secondary | ICD-10-CM

## 2020-01-31 DIAGNOSIS — E039 Hypothyroidism, unspecified: Secondary | ICD-10-CM | POA: Diagnosis not present

## 2020-01-31 LAB — BAYER DCA HB A1C WAIVED: HB A1C (BAYER DCA - WAIVED): 9.6 % — ABNORMAL HIGH (ref <7.0)

## 2020-01-31 MED ORDER — CYCLOBENZAPRINE HCL 10 MG PO TABS: 10.0000 mg | ORAL_TABLET | Freq: Three times a day (TID) | ORAL | 5 refills | Status: DC | PRN

## 2020-01-31 MED ORDER — ATORVASTATIN CALCIUM 10 MG PO TABS: 10.0000 mg | ORAL_TABLET | Freq: Every day | ORAL | 5 refills | Status: DC

## 2020-01-31 MED ORDER — ALPRAZOLAM 1 MG PO TABS: 1.0000 mg | ORAL_TABLET | Freq: Every evening | ORAL | 5 refills | Status: DC | PRN

## 2020-01-31 MED ORDER — CETIRIZINE HCL 10 MG PO TABS: 10.0000 mg | ORAL_TABLET | Freq: Every day | ORAL | 3 refills | Status: AC

## 2020-01-31 MED ORDER — BUSPIRONE HCL 10 MG PO TABS: 10.0000 mg | ORAL_TABLET | Freq: Three times a day (TID) | ORAL | 5 refills | Status: DC

## 2020-01-31 MED ORDER — TOUJEO MAX SOLOSTAR 300 UNIT/ML ~~LOC~~ SOPN: 40.0000 [IU] | PEN_INJECTOR | Freq: Two times a day (BID) | SUBCUTANEOUS | 11 refills | Status: DC

## 2020-01-31 MED ORDER — PANTOPRAZOLE SODIUM 40 MG PO TBEC: 40.0000 mg | DELAYED_RELEASE_TABLET | Freq: Every day | ORAL | 3 refills | Status: DC

## 2020-01-31 MED ORDER — INSULIN LISPRO (1 UNIT DIAL) 100 UNIT/ML (KWIKPEN): PEN_INJECTOR | SUBCUTANEOUS | 1 refills | Status: DC

## 2020-01-31 MED ORDER — CLOBETASOL PROPIONATE 0.05 % EX SOLN: 1.0000 "application " | Freq: Two times a day (BID) | CUTANEOUS | 0 refills | Status: DC

## 2020-01-31 MED ORDER — LISINOPRIL 20 MG PO TABS: 20.0000 mg | ORAL_TABLET | Freq: Every day | ORAL | 5 refills | Status: DC

## 2020-01-31 NOTE — Addendum Note (Signed)
Addended by: Claretta Fraise on: 01/31/2020 09:03 AM   Modules accepted: Orders

## 2020-01-31 NOTE — Progress Notes (Addendum)
Subjective:  Patient ID: Charles Foley,  male    DOB: 1961-08-11  Age: 59 y.o.    CC: Medical Management of Chronic Issues   HPI Salah Nakamura presents for  follow-up of hypertension. Patient has no history of headache chest pain or shortness of breath or recent cough. Patient also denies symptoms of TIA such as numbness weakness lateralizing. Patient denies side effects from medication. States taking it regularly.  Patient also  in for follow-up of elevated cholesterol. Doing well without complaints on current medication. Denies side effects  including myalgia and arthralgia and nausea. Also in today for liver function testing. Currently no chest pain, shortness of breath or other cardiovascular related symptoms noted.  Follow-up of diabetes. Patient does check blood sugar at home.  Patient denies symptoms such as excessive hunger or urinary frequency, excessive hunger, nausea No significant hypoglycemic spells noted. Medications reviewed. Pt reports taking them regularly. Pt. denies complication/adverse reaction today.    follow-up on  thyroid. The patient has a history of hypothyroidism for many years. It has been stable recently. Pt. denies any change in  voice, loss of hair, heat or cold intolerance. Energy level has been adequate to good. Patient denies constipation and diarrhea. No myxedema. Medication is as noted below. Verified that pt is taking it daily on an empty stomach. Well tolerated.    History Spero has a past medical history of Acute pain of right knee (06/17/2017), Allergic rhinitis, Anxiety, Arthritis, Cancer ([place]) (2012), Chest pain, and Diabetes mellitus without complication ([place]).   He has a past surgical history that includes Prostatectomy (2012); Arthroplasty (Bilateral); and ORIF facial fracture (1988).   His family history includes Diabetes in his mother; Heart disease in his mother.He reports that he quit smoking about 10 years ago. He quit smokeless tobacco use  about 17 years ago. He reports current alcohol use. He reports that he does not use drugs.  Current Outpatient Medications on File Prior to Visit  Medication Sig Dispense Refill  . acetaminophen (TYLENOL) 325 MG tablet Take 650 mg by mouth every 6 (six) hours as needed for pain.    . Biotin 1000 MCG tablet Take 1,000 mcg by mouth daily.     Marland Kitchen ibuprofen (ADVIL,MOTRIN) 200 MG tablet Take 200 mg by mouth every 6 (six) hours as needed for pain.    Marland Kitchen levothyroxine (SYNTHROID) 25 MCG tablet Take 1 tablet (25 mcg total) by mouth daily. 30 tablet 5  . Multiple Vitamin (MULTIVITAMIN) tablet Take 1 tablet by mouth daily.    . nabumetone (RELAFEN) 500 MG tablet Take 2 tablets (1,000 mg total) by mouth 2 (two) times daily. For muscle and joint pain 360 tablet 1  . ONE TOUCH ULTRA TEST test strip CHECK BLOOD SUGAR 1 TO 2 TIMES DAILY OR AS DIRECTED 200 each 11   No current facility-administered medications on file prior to visit.    ROS Review of Systems  Constitutional: Negative.   HENT: Negative.   Eyes: Negative for visual disturbance.  Respiratory: Negative for cough and shortness of breath.   Cardiovascular: Negative for chest pain and leg swelling.  Gastrointestinal: Negative for abdominal pain, diarrhea, nausea and vomiting.  Genitourinary: Negative for difficulty urinating.  Musculoskeletal: Negative for arthralgias and myalgias.  Skin: Negative for rash.  Neurological: Negative for headaches.  Psychiatric/Behavioral: Negative for sleep disturbance.    Objective:  BP (!) 146/73   Pulse 72   Temp 98.2 F (36.8 C) (Temporal)   Resp 20  Ht 5' 9"  (1.753 m)   Wt 197 lb (89.4 kg)   SpO2 98%   BMI 29.09 kg/m   BP Readings from Last 3 Encounters:  01/31/20 (!) 146/73  11/02/19 (!) 149/72  08/11/19 (!) 154/80    Wt Readings from Last 3 Encounters:  01/31/20 197 lb (89.4 kg)  11/02/19 196 lb 6 oz (89.1 kg)  08/11/19 201 lb (91.2 kg)     Physical Exam Vitals reviewed.    Constitutional:      Appearance: He is well-developed.  HENT:     Head: Normocephalic and atraumatic.     Right Ear: External ear normal.     Left Ear: External ear normal.     Mouth/Throat:     Pharynx: No oropharyngeal exudate or posterior oropharyngeal erythema.  Eyes:     Pupils: Pupils are equal, round, and reactive to light.  Cardiovascular:     Rate and Rhythm: Normal rate and regular rhythm.     Heart sounds: No murmur.  Pulmonary:     Effort: No respiratory distress.     Breath sounds: Normal breath sounds.  Abdominal:     General: Abdomen is flat. There is no distension.     Palpations: There is no mass.     Tenderness: There is no abdominal tenderness.  Musculoskeletal:     Cervical back: Normal range of motion and neck supple.  Neurological:     Mental Status: He is alert and oriented to person, place, and time.      Assessment & Plan:   Corran was seen today for medical management of chronic issues.  Diagnoses and all orders for this visit:  Type 1 diabetes mellitus without complication (HCC) -     CBC with Differential/Platelet -     CMP14+EGFR -     Lipid panel -     Bayer DCA Hb A1c Waived -     C-peptide -     atorvastatin (LIPITOR) 10 MG tablet; Take 1 tablet (10 mg total) by mouth daily. -     insulin lispro (HUMALOG KWIKPEN) 100 UNIT/ML KwikPen; INJECT UP TO 20 UNITS TIS BEFORE MEALS  Hypothyroidism, unspecified type -     CBC with Differential/Platelet -     CMP14+EGFR -     Lipid panel -     Thyroid Panel With TSH  Essential hypertension -     CBC with Differential/Platelet -     CMP14+EGFR -     Lipid panel -     lisinopril (ZESTRIL) 20 MG tablet; Take 1 tablet (20 mg total) by mouth daily.  Controlled substance agreement signed -     ToxASSURE Select 13 (MW), Urine  Need for hepatitis C screening test -     Hepatitis C antibody  Encounter for screening for HIV -     HIV Antibody (routine testing w rflx)  Personal history of  prostate cancer -     PSA Total (Reflex To Free)  GAD (generalized anxiety disorder) -     ALPRAZolam (XANAX) 1 MG tablet; Take 1 tablet (1 mg total) by mouth at bedtime as needed. for sleep -     busPIRone (BUSPAR) 10 MG tablet; Take 1 tablet (10 mg total) by mouth 3 (three) times daily.  Seborrhea -     clobetasol (TEMOVATE) 0.05 % external solution; Apply 1 application topically 2 (two) times daily.  Other osteoarthritis involving multiple joints -     cyclobenzaprine (FLEXERIL) 10 MG tablet; Take  1 tablet (10 mg total) by mouth 3 (three) times daily as needed for muscle spasms.  Other orders -     cetirizine (ZYRTEC) 10 MG tablet; Take 1 tablet (10 mg total) by mouth daily. -     insulin glargine, 2 Unit Dial, (TOUJEO MAX SOLOSTAR) 300 UNIT/ML Solostar Pen; Inject 40 Units into the skin in the morning and at bedtime. -     pantoprazole (PROTONIX) 40 MG tablet; Take 1 tablet (40 mg total) by mouth daily.   I have discontinued Locklan Canoy "[name]"'s loratadine, insulin degludec, Tyler Aas FlexTouch, and meloxicam. I have also changed his cetirizine. Additionally, I am having him maintain his acetaminophen, ibuprofen, Biotin, multivitamin, ONE TOUCH ULTRA TEST, nabumetone, levothyroxine, ALPRAZolam, atorvastatin, busPIRone, clobetasol, Toujeo Max SoloStar, insulin lispro, lisinopril, pantoprazole, and cyclobenzaprine.  Meds ordered this encounter  Medications  . ALPRAZolam (XANAX) 1 MG tablet    Sig: Take 1 tablet (1 mg total) by mouth at bedtime as needed. for sleep    Dispense:  30 tablet    Refill:  5  . atorvastatin (LIPITOR) 10 MG tablet    Sig: Take 1 tablet (10 mg total) by mouth daily.    Dispense:  30 tablet    Refill:  5  . busPIRone (BUSPAR) 10 MG tablet    Sig: Take 1 tablet (10 mg total) by mouth 3 (three) times daily.    Dispense:  90 tablet    Refill:  5  . cetirizine (ZYRTEC) 10 MG tablet    Sig: Take 1 tablet (10 mg total) by mouth daily.    Dispense:  90 tablet     Refill:  3  . clobetasol (TEMOVATE) 0.05 % external solution    Sig: Apply 1 application topically 2 (two) times daily.    Dispense:  50 mL    Refill:  0  . insulin glargine, 2 Unit Dial, (TOUJEO MAX SOLOSTAR) 300 UNIT/ML Solostar Pen    Sig: Inject 40 Units into the skin in the morning and at bedtime.    Dispense:  9 mL    Refill:  11  . insulin lispro (HUMALOG KWIKPEN) 100 UNIT/ML KwikPen    Sig: INJECT UP TO 20 UNITS TIS BEFORE MEALS    Dispense:  30 mL    Refill:  1    $  . lisinopril (ZESTRIL) 20 MG tablet    Sig: Take 1 tablet (20 mg total) by mouth daily.    Dispense:  30 tablet    Refill:  5  . pantoprazole (PROTONIX) 40 MG tablet    Sig: Take 1 tablet (40 mg total) by mouth daily.    Dispense:  90 tablet    Refill:  3  . cyclobenzaprine (FLEXERIL) 10 MG tablet    Sig: Take 1 tablet (10 mg total) by mouth 3 (three) times daily as needed for muscle spasms.    Dispense:  90 tablet    Refill:  5    $   DM education with Lottie Dawson. He needs to learn to count carbs and use a mealtime correction / carb dose of humalog.   Follow-up: Return in about 3 months (around 05/02/2020).  Claretta Fraise, M.D.

## 2020-02-01 LAB — CBC WITH DIFFERENTIAL/PLATELET
Basophils Absolute: 0.1 10*3/uL (ref 0.0–0.2)
Basos: 1 %
EOS (ABSOLUTE): 0.1 10*3/uL (ref 0.0–0.4)
Eos: 2 %
Hematocrit: 44.7 % (ref 37.5–51.0)
Hemoglobin: 15.1 g/dL (ref 13.0–17.7)
Immature Grans (Abs): 0 10*3/uL (ref 0.0–0.1)
Immature Granulocytes: 0 %
Lymphocytes Absolute: 2.1 10*3/uL (ref 0.7–3.1)
Lymphs: 25 %
MCH: 31.5 pg (ref 26.6–33.0)
MCHC: 33.8 g/dL (ref 31.5–35.7)
MCV: 93 fL (ref 79–97)
Monocytes Absolute: 0.6 10*3/uL (ref 0.1–0.9)
Monocytes: 7 %
Neutrophils Absolute: 5.4 10*3/uL (ref 1.4–7.0)
Neutrophils: 65 %
Platelets: 306 10*3/uL (ref 150–450)
RBC: 4.79 x10E6/uL (ref 4.14–5.80)
RDW: 12.4 % (ref 11.6–15.4)
WBC: 8.2 10*3/uL (ref 3.4–10.8)

## 2020-02-01 LAB — CMP14+EGFR
ALT: 28 IU/L (ref 0–44)
AST: 22 IU/L (ref 0–40)
Albumin/Globulin Ratio: 1.5 (ref 1.2–2.2)
Albumin: 4.6 g/dL (ref 3.8–4.8)
Alkaline Phosphatase: 67 IU/L (ref 39–117)
BUN/Creatinine Ratio: 19 (ref 10–24)
BUN: 18 mg/dL (ref 8–27)
Bilirubin Total: 0.9 mg/dL (ref 0.0–1.2)
CO2: 23 mmol/L (ref 20–29)
Calcium: 10.4 mg/dL — ABNORMAL HIGH (ref 8.6–10.2)
Chloride: 99 mmol/L (ref 96–106)
Creatinine, Ser: 0.94 mg/dL (ref 0.76–1.27)
GFR calc Af Amer: 101 mL/min/{1.73_m2} (ref >59)
GFR calc non Af Amer: 87 mL/min/{1.73_m2} (ref >59)
Globulin, Total: 3.1 g/dL (ref 1.5–4.5)
Glucose: 244 mg/dL — ABNORMAL HIGH (ref 65–99)
Potassium: 4.7 mmol/L (ref 3.5–5.2)
Sodium: 138 mmol/L (ref 134–144)
Total Protein: 7.7 g/dL (ref 6.0–8.5)

## 2020-02-01 LAB — LIPID PANEL
Chol/HDL Ratio: 2.8 ratio (ref 0.0–5.0)
Cholesterol, Total: 150 mg/dL (ref 100–199)
HDL: 54 mg/dL (ref >39)
LDL Chol Calc (NIH): 80 mg/dL (ref 0–99)
Triglycerides: 86 mg/dL (ref 0–149)
VLDL Cholesterol Cal: 16 mg/dL (ref 5–40)

## 2020-02-01 LAB — THYROID PANEL WITH TSH
Free Thyroxine Index: 2.8 (ref 1.2–4.9)
T3 Uptake Ratio: 32 % (ref 24–39)
T4, Total: 8.8 ug/dL (ref 4.5–12.0)
TSH: 2.32 u[IU]/mL (ref 0.450–4.500)

## 2020-02-01 LAB — HEPATITIS C ANTIBODY: Hep C Virus Ab: <0.1 s/co ratio (ref 0.0–0.9)

## 2020-02-01 LAB — HIV ANTIBODY (ROUTINE TESTING W REFLEX): HIV Screen 4th Generation wRfx: NONREACTIVE

## 2020-02-02 ENCOUNTER — Ambulatory Visit: Payer: 59 | Admitting: Pharmacist

## 2020-02-02 ENCOUNTER — Other Ambulatory Visit: Payer: Self-pay

## 2020-02-02 DIAGNOSIS — E109 Type 1 diabetes mellitus without complications: Secondary | ICD-10-CM | POA: Diagnosis not present

## 2020-02-02 NOTE — Progress Notes (Signed)
02/02/2020 Name: Charles Foley MRN: [id number] DOB: Feb 14, 1961  Referred by: Claretta Fraise, MD Reason for referral : Diabetes   S:   47 yoM presents for diabetes evaluation, education, and management.  Patient was diagnosed with T1DM over 30 years ago. Patient was referred and last seen by Primary Care Provider on 02/01/20.  He also sees [facility] Endocrinology in [place], [place]--last visit 08/05/2019).  He has been injecting insulin as long as he can remember. Insurance coverage/medication affordability: [employer] commerical  Patient reports adherence with medications.  Current diabetes medications include: Toujeo 40 units in AM & 40 units in PM, Humalog up to 20 units before meals  Current hypertension medications include: lisinopril 20mg   Current hyperlipidemia medications include: atorvastatin 10mg   Patient reports hypoglycemic events.  Patient reported dietary habits: Eats 2-3 meals/day Dietary habits are altered base on 3rd shift work schedule--12am leave for work, bedtime is 7pm Breakfast: protein shake, suggested eggs/protein Lunch/Dinner: salads, chicken Snacks: Special K protein shakes(1-2 /day)  Drinks: protein shakes, water, avoids sugary drinks  Patient-reported exercise habits: strenous physical activity at work  Patient may have neuropathy (nerve pain).  He cannot tell the difference in "pain from over-working" vs neuoropathy Patient reports self foot exams.   O:   Lab Results  Component Value Date   HGBA1C 9.6 (H) 01/31/2020    Lipid Panel     Component Value Date/Time   CHOL 150 01/31/2020 0801   TRIG 86 01/31/2020 0801   HDL 54 01/31/2020 0801   CHOLHDL 2.8 01/31/2020 0801   LDLCALC 80 01/31/2020 0801   Home fasting blood sugars: sporadic, states they run high in the AM, low overnight  2 hour post-meal/random blood sugars: varies--requested patient bring in CGM libre in order for [place] to collect data & adjust  insulin   A/P:  Diabetes T1DM currently uncontrolled. Patient is able to verbalize appropriate hypoglycemia management plan. Patient is adherent with medication. Control is suboptimal due to dietary/lifestyle/patient works 3rd shift--states he is off when in comes to meals/insulin.  -Continued basal insulin Toujeo (insulin glargine) 40 units BID.   -Continued  rapid insulin Humalog (insulin lispro) as below.  Patient has not been following SSI as directed.  He does not like taking it before going to work for fear of hypoglycemia.  Suggested patient carry protein snacks to work to avoid this.  He eat a balanced dinner.  -Provided written instructions for insulin/sliding scale/diet per Endocrine to refer to as follows:  Split Tresiba 40 units in the morning and 40 units at night  Humalog 15-20 units three times daily with meals  15 units if eating and BG between 100-200  20 units if eating and BG >200  If having a snack >15 g and BG >150 give  10 units   Do not give Humalog sooner than 4 hours  Monitor carb intake  Think about insulin pump  Please call or message if BG is consistently less than 70 or greater than 300   Always carry rapid acting carbohydrate with you (like glucose tablets) in case you need to treat a low blood sugar.   -Will continue to explore sliding scale adjustments, may consider Dexcom G6 for better accuracy and quicker time to prevent hypoglycemia  -Extensively discussed pathophysiology of diabetes, recommended lifestyle interventions, dietary effects on blood sugar control  -Copay cards provided for patients insulin  -Counseled on s/sx of and management of hypoglycemia  -Next A1C anticipated August 2021  Written patient instructions provided.  Total time in face to face counseling 30 minutes.   Follow up [facility] Visit in 3-4 weeks    Regina Eck, PharmD, BCPS Clinical Pharmacist, [place]  II Phone [phone]

## 2020-02-03 LAB — SPECIMEN STATUS REPORT

## 2020-02-03 LAB — PSA TOTAL (REFLEX TO FREE): Prostate Specific Ag, Serum: <0.1 ng/mL (ref 0.0–4.0)

## 2020-02-03 LAB — TOXASSURE SELECT 13 (MW), URINE

## 2020-02-03 LAB — C-PEPTIDE: C-Peptide: <0.1 ng/mL — ABNORMAL LOW (ref 1.1–4.4)

## 2020-02-08 ENCOUNTER — Encounter: Payer: Self-pay | Admitting: Pharmacist

## 2020-02-17 ENCOUNTER — Other Ambulatory Visit: Payer: Self-pay | Admitting: *Deleted

## 2020-02-17 ENCOUNTER — Ambulatory Visit: Payer: Self-pay | Admitting: Pharmacist

## 2020-02-17 MED ORDER — ONETOUCH VERIO VI STRP: ORAL_STRIP | 12 refills | Status: DC

## 2020-05-02 ENCOUNTER — Encounter: Payer: Self-pay | Admitting: Family Medicine

## 2020-05-02 ENCOUNTER — Other Ambulatory Visit: Payer: Self-pay

## 2020-05-02 ENCOUNTER — Ambulatory Visit: Payer: 59 | Admitting: Family Medicine

## 2020-05-02 VITALS — BP 139/74 | HR 62 | Temp 98.2°F | Ht 69.0 in | Wt 195.6 lb

## 2020-05-02 DIAGNOSIS — Z23 Encounter for immunization: Secondary | ICD-10-CM | POA: Diagnosis not present

## 2020-05-02 DIAGNOSIS — E109 Type 1 diabetes mellitus without complications: Secondary | ICD-10-CM

## 2020-05-02 DIAGNOSIS — F411 Generalized anxiety disorder: Secondary | ICD-10-CM

## 2020-05-02 DIAGNOSIS — I10 Essential (primary) hypertension: Secondary | ICD-10-CM | POA: Diagnosis not present

## 2020-05-02 DIAGNOSIS — E039 Hypothyroidism, unspecified: Secondary | ICD-10-CM

## 2020-05-02 LAB — BAYER DCA HB A1C WAIVED: HB A1C (BAYER DCA - WAIVED): 9.5 % — ABNORMAL HIGH (ref <7.0)

## 2020-05-02 NOTE — Progress Notes (Signed)
Subjective:  Patient ID: Charles Foley,  male    DOB: [birth year]  Age: 59 y.o.    CC: Follow-up (3 month)   HPI Charles Foley presents for  follow-up of hypertension. Patient has no history of headache chest pain or shortness of breath or recent cough. Patient also denies symptoms of TIA such as numbness weakness lateralizing. Patient denies side effects from medication. States taking it regularly.  Patient also  in for follow-up of elevated cholesterol. Doing well without complaints on current medication. Denies side effects  including myalgia and arthralgia and nausea. Also in today for liver function testing. Currently no chest pain, shortness of breath or other cardiovascular related symptoms noted.  Follow-up of diabetes. Patient does check blood sugar at home. Patient denies symptoms such as excessive hunger or urinary frequency, excessive hunger, nausea No significant hypoglycemic spells noted. Medications reviewed. Pt reports taking them regularly. Pt. denies complication/adverse reaction today.    follow-up on  thyroid. The patient has a history of hypothyroidism for many years. It has been stable recently. Pt. denies any change in  voice, loss of hair, heat or cold intolerance. Energy level has been adequate to good. Patient denies constipation and diarrhea. No myxedema. Medication is as noted below. Verified that pt is taking it daily on an empty stomach. Well tolerated.  GAD 7 : Generalized Anxiety Score 05/02/2020  Nervous, Anxious, on Edge 0  Control/stop worrying 0  Worry too much - different things 0  Trouble relaxing 3  Restless 3  Easily annoyed or irritable 0  Afraid - awful might happen 0  Total GAD 7 Score 6     Depression screen [facility] 2/9 05/02/2020 11/02/2019 08/11/2019 07/18/2019 03/30/2019  Decreased Interest 0 0 0 0 0  Down, Depressed, Hopeless 0 0 0 0 0  PHQ - 2 Score 0 0 0 0 0  Altered sleeping - - 0 - -  Tired, decreased energy - - 0 - -  Change in appetite -  - 0 - -  Feeling bad or failure about yourself  - - 0 - -  Trouble concentrating - - 0 - -  Moving slowly or fidgety/restless - - 0 - -  Suicidal thoughts - - 0 - -  PHQ-9 Score - - 0 - -    History Charles Foley has a past medical history of Acute pain of right knee (06/17/2017), Allergic rhinitis, Anxiety, Arthritis, Cancer ([place]) (2012), Chest pain, and Diabetes mellitus without complication ([place]).   He has a past surgical history that includes Prostatectomy (2012); Arthroplasty (Bilateral); and ORIF facial fracture (1988).   His family history includes Diabetes in his mother; Heart disease in his mother.He reports that he quit smoking about 11 years ago. He quit smokeless tobacco use about 17 years ago. He reports current alcohol use. He reports that he does not use drugs.  Current Outpatient Medications on File Prior to Visit  Medication Sig Dispense Refill  . acetaminophen (TYLENOL) 325 MG tablet Take 650 mg by mouth every 6 (six) hours as needed for pain.    Marland Kitchen ALPRAZolam (XANAX) 1 MG tablet Take 1 tablet (1 mg total) by mouth at bedtime as needed. for sleep 30 tablet 5  . atorvastatin (LIPITOR) 10 MG tablet Take 1 tablet (10 mg total) by mouth daily. 30 tablet 5  . Biotin 1000 MCG tablet Take 1,000 mcg by mouth daily.     . busPIRone (BUSPAR) 10 MG tablet Take 1 tablet (10 mg total) by mouth  3 (three) times daily. 90 tablet 5  . cetirizine (ZYRTEC) 10 MG tablet Take 1 tablet (10 mg total) by mouth daily. 90 tablet 3  . clobetasol (TEMOVATE) 0.05 % external solution Apply 1 application topically 2 (two) times daily. 50 mL 0  . cyclobenzaprine (FLEXERIL) 10 MG tablet Take 1 tablet (10 mg total) by mouth 3 (three) times daily as needed for muscle spasms. 90 tablet 5  . glucose blood (ONETOUCH VERIO) test strip Check Blood Sugars 1 to 2 times daily. 100 each 12  . ibuprofen (ADVIL,MOTRIN) 200 MG tablet Take 200 mg by mouth every 6 (six) hours as needed for pain.    Marland Kitchen insulin glargine, 2 Unit  Dial, (TOUJEO MAX SOLOSTAR) 300 UNIT/ML Solostar Pen Inject 40 Units into the skin in the morning and at bedtime. 9 mL 11  . insulin lispro (HUMALOG KWIKPEN) 100 UNIT/ML KwikPen INJECT UP TO 20 UNITS TIS BEFORE MEALS 30 mL 1  . levothyroxine (SYNTHROID) 25 MCG tablet Take 1 tablet (25 mcg total) by mouth daily. 30 tablet 5  . lisinopril (ZESTRIL) 20 MG tablet Take 1 tablet (20 mg total) by mouth daily. 30 tablet 5  . Multiple Vitamin (MULTIVITAMIN) tablet Take 1 tablet by mouth daily.    . nabumetone (RELAFEN) 500 MG tablet Take 2 tablets (1,000 mg total) by mouth 2 (two) times daily. For muscle and joint pain 360 tablet 1  . pantoprazole (PROTONIX) 40 MG tablet Take 1 tablet (40 mg total) by mouth daily. 90 tablet 3  . Continuous Blood Gluc Sensor (FREESTYLE LIBRE 2 SENSOR) MISC by Does not apply route. (Patient not taking: Reported on 05/02/2020)     No current facility-administered medications on file prior to visit.    ROS Review of Systems  Constitutional: Negative.   HENT: Negative.   Eyes: Negative for visual disturbance.  Respiratory: Negative for cough and shortness of breath.   Cardiovascular: Negative for chest pain and leg swelling.  Gastrointestinal: Negative for abdominal pain, diarrhea, nausea and vomiting.  Genitourinary: Negative for difficulty urinating.  Musculoskeletal: Negative for arthralgias and myalgias.  Skin: Negative for rash.  Neurological: Negative for headaches.  Psychiatric/Behavioral: Negative for sleep disturbance.    Objective:  BP 139/74   Pulse 62   Temp 98.2 F (36.8 C) (Temporal)   Ht 5' 9"  (1.753 m)   Wt 195 lb 9.6 oz (88.7 kg)   BMI 28.89 kg/m   BP Readings from Last 3 Encounters:  05/02/20 139/74  01/31/20 (!) 146/73  11/02/19 (!) 149/72    Wt Readings from Last 3 Encounters:  05/02/20 195 lb 9.6 oz (88.7 kg)  01/31/20 197 lb (89.4 kg)  11/02/19 196 lb 6 oz (89.1 kg)     Physical Exam Constitutional:      General: He is not  in acute distress.    Appearance: He is well-developed.  HENT:     Head: Normocephalic and atraumatic.     Right Ear: External ear normal.     Left Ear: External ear normal.     Nose: Nose normal.  Eyes:     Conjunctiva/sclera: Conjunctivae normal.     Pupils: Pupils are equal, round, and reactive to light.  Cardiovascular:     Rate and Rhythm: Normal rate and regular rhythm.     Heart sounds: Normal heart sounds. No murmur heard.   Pulmonary:     Effort: Pulmonary effort is normal. No respiratory distress.     Breath sounds: Normal breath sounds. No  wheezing or rales.  Abdominal:     Palpations: Abdomen is soft.     Tenderness: There is no abdominal tenderness.  Musculoskeletal:        General: Normal range of motion.     Cervical back: Normal range of motion and neck supple.  Skin:    General: Skin is warm and dry.  Neurological:     Mental Status: He is alert and oriented to person, place, and time.     Deep Tendon Reflexes: Reflexes are normal and symmetric.  Psychiatric:        Behavior: Behavior normal.        Thought Content: Thought content normal.        Judgment: Judgment normal.     Diabetic Foot Exam - Simple   No data filed        Assessment & Plan:   Natividad was seen today for follow-up.  Diagnoses and all orders for this visit:  Type 1 diabetes mellitus without complication ([place]) -     CBC with Differential/Platelet -     CMP14+EGFR -     Bayer DCA Hb A1c Waived  Hypothyroidism, unspecified type -     CBC with Differential/Platelet -     CMP14+EGFR -     Thyroid Panel With TSH  Essential hypertension -     CBC with Differential/Platelet -     CMP14+EGFR  GAD (generalized anxiety disorder)  Other orders -     Pneumococcal polysaccharide vaccine 23-valent greater than or equal to 2yo subcutaneous/IM   I am having Charles Land "[name]" maintain his acetaminophen, ibuprofen, Biotin, multivitamin, nabumetone, levothyroxine, ALPRAZolam,  atorvastatin, busPIRone, cetirizine, clobetasol, Toujeo Max SoloStar, insulin lispro, lisinopril, pantoprazole, cyclobenzaprine, FreeStyle Libre 2 Sensor, and [employer].  No orders of the defined types were placed in this encounter.    Follow-up: No follow-ups on file.  Claretta Fraise, M.D.

## 2020-05-03 LAB — CMP14+EGFR
ALT: 30 IU/L (ref 0–44)
AST: 22 IU/L (ref 0–40)
Albumin/Globulin Ratio: 1.5 (ref 1.2–2.2)
Albumin: 4.4 g/dL (ref 3.8–4.8)
Alkaline Phosphatase: 69 IU/L (ref 48–121)
BUN/Creatinine Ratio: 20 (ref 10–24)
BUN: 18 mg/dL (ref 8–27)
Bilirubin Total: 0.8 mg/dL (ref 0.0–1.2)
CO2: 21 mmol/L (ref 20–29)
Calcium: 10.2 mg/dL (ref 8.6–10.2)
Chloride: 101 mmol/L (ref 96–106)
Creatinine, Ser: 0.92 mg/dL (ref 0.76–1.27)
GFR calc Af Amer: 103 mL/min/{1.73_m2} (ref >59)
GFR calc non Af Amer: 89 mL/min/{1.73_m2} (ref >59)
Globulin, Total: 2.9 g/dL (ref 1.5–4.5)
Glucose: 219 mg/dL — ABNORMAL HIGH (ref 65–99)
Potassium: 5.3 mmol/L — ABNORMAL HIGH (ref 3.5–5.2)
Sodium: 137 mmol/L (ref 134–144)
Total Protein: 7.3 g/dL (ref 6.0–8.5)

## 2020-05-03 LAB — CBC WITH DIFFERENTIAL/PLATELET
Basophils Absolute: 0.1 10*3/uL (ref 0.0–0.2)
Basos: 1 %
EOS (ABSOLUTE): 0.3 10*3/uL (ref 0.0–0.4)
Eos: 4 %
Hematocrit: 44.8 % (ref 37.5–51.0)
Hemoglobin: 15.1 g/dL (ref 13.0–17.7)
Immature Grans (Abs): 0 10*3/uL (ref 0.0–0.1)
Immature Granulocytes: 0 %
Lymphocytes Absolute: 2.4 10*3/uL (ref 0.7–3.1)
Lymphs: 36 %
MCH: 30.6 pg (ref 26.6–33.0)
MCHC: 33.7 g/dL (ref 31.5–35.7)
MCV: 91 fL (ref 79–97)
Monocytes Absolute: 0.5 10*3/uL (ref 0.1–0.9)
Monocytes: 7 %
Neutrophils Absolute: 3.4 10*3/uL (ref 1.4–7.0)
Neutrophils: 52 %
Platelets: 274 10*3/uL (ref 150–450)
RBC: 4.94 x10E6/uL (ref 4.14–5.80)
RDW: 11.9 % (ref 11.6–15.4)
WBC: 6.7 10*3/uL (ref 3.4–10.8)

## 2020-05-03 LAB — THYROID PANEL WITH TSH
Free Thyroxine Index: 2.6 (ref 1.2–4.9)
T3 Uptake Ratio: 31 % (ref 24–39)
T4, Total: 8.4 ug/dL (ref 4.5–12.0)
TSH: 1.65 u[IU]/mL (ref 0.450–4.500)

## 2020-05-04 NOTE — Progress Notes (Signed)
Hello Dolan,  Your lab result is normal and/or stable.Some minor variations that are not significant are commonly marked abnormal, but do not represent any medical problem for you.  Best regards, Maimuna Leaman, M.D.

## 2020-05-12 ENCOUNTER — Encounter: Payer: Self-pay | Admitting: Family Medicine

## 2020-05-12 NOTE — Addendum Note (Signed)
Addended by: Claretta Fraise on: 05/12/2020 09:31 PM   Modules accepted: Orders

## 2020-05-17 ENCOUNTER — Telehealth: Payer: Self-pay | Admitting: Family Medicine

## 2020-05-17 NOTE — Telephone Encounter (Signed)
Pt returned missed call from [facility] regarding referral to Endocrinologist. Pt says he used to be seen by a lady named Charles Foley through [facility]. That is who he would like to continue seeing if possible.

## 2020-06-13 ENCOUNTER — Ambulatory Visit: Payer: Self-pay

## 2020-06-13 ENCOUNTER — Other Ambulatory Visit: Payer: Self-pay

## 2020-06-13 ENCOUNTER — Other Ambulatory Visit: Payer: Self-pay | Admitting: Occupational Medicine

## 2020-06-13 DIAGNOSIS — M545 Low back pain, unspecified: Secondary | ICD-10-CM

## 2020-08-06 ENCOUNTER — Ambulatory Visit: Payer: 59 | Admitting: Family Medicine

## 2020-08-06 ENCOUNTER — Other Ambulatory Visit: Payer: Self-pay

## 2020-08-06 ENCOUNTER — Encounter: Payer: Self-pay | Admitting: Family Medicine

## 2020-08-06 VITALS — BP 130/85 | HR 78 | Temp 98.1°F | Ht 69.0 in | Wt 196.0 lb

## 2020-08-06 DIAGNOSIS — E109 Type 1 diabetes mellitus without complications: Secondary | ICD-10-CM

## 2020-08-06 DIAGNOSIS — Z79899 Other long term (current) drug therapy: Secondary | ICD-10-CM

## 2020-08-06 DIAGNOSIS — E039 Hypothyroidism, unspecified: Secondary | ICD-10-CM

## 2020-08-06 DIAGNOSIS — F411 Generalized anxiety disorder: Secondary | ICD-10-CM

## 2020-08-06 DIAGNOSIS — Z1211 Encounter for screening for malignant neoplasm of colon: Secondary | ICD-10-CM

## 2020-08-06 DIAGNOSIS — M158 Other polyosteoarthritis: Secondary | ICD-10-CM

## 2020-08-06 DIAGNOSIS — I10 Essential (primary) hypertension: Secondary | ICD-10-CM | POA: Diagnosis not present

## 2020-08-06 LAB — BAYER DCA HB A1C WAIVED: HB A1C (BAYER DCA - WAIVED): 8.1 % — ABNORMAL HIGH (ref <7.0)

## 2020-08-06 MED ORDER — TOUJEO MAX SOLOSTAR 300 UNIT/ML ~~LOC~~ SOPN
40.0000 [IU] | PEN_INJECTOR | Freq: Two times a day (BID) | SUBCUTANEOUS | 11 refills | Status: DC
Start: 2020-08-06 — End: 2020-11-07

## 2020-08-06 MED ORDER — BUSPIRONE HCL 10 MG PO TABS: 10.0000 mg | ORAL_TABLET | Freq: Three times a day (TID) | ORAL | 5 refills | Status: DC

## 2020-08-06 MED ORDER — INSULIN LISPRO (1 UNIT DIAL) 100 UNIT/ML (KWIKPEN): PEN_INJECTOR | SUBCUTANEOUS | 1 refills | Status: DC

## 2020-08-06 MED ORDER — LISINOPRIL 20 MG PO TABS: 20.0000 mg | ORAL_TABLET | Freq: Every day | ORAL | 1 refills | Status: DC

## 2020-08-06 MED ORDER — LEVOTHYROXINE SODIUM 25 MCG PO TABS: 25.0000 ug | ORAL_TABLET | Freq: Every day | ORAL | 1 refills | Status: DC

## 2020-08-06 MED ORDER — ATORVASTATIN CALCIUM 10 MG PO TABS: 10.0000 mg | ORAL_TABLET | Freq: Every day | ORAL | 1 refills | Status: DC

## 2020-08-06 MED ORDER — ALPRAZOLAM 1 MG PO TABS: 1.0000 mg | ORAL_TABLET | Freq: Every evening | ORAL | 5 refills | Status: DC | PRN

## 2020-08-06 MED ORDER — CYCLOBENZAPRINE HCL 10 MG PO TABS: 10.0000 mg | ORAL_TABLET | Freq: Three times a day (TID) | ORAL | 5 refills | Status: DC | PRN

## 2020-08-06 NOTE — Progress Notes (Signed)
Subjective:  Patient ID: Charles Foley,  male    DOB: 1961/08/27  Age: 59 y.o.    CC: Follow-up (3 month)   HPI Charles Foley presents for  follow-up of hypertension. Patient has no history of headache chest pain or shortness of breath or recent cough. Patient also denies symptoms of TIA such as numbness weakness lateralizing. Patient denies side effects from medication. States taking it regularly.  Patient also  in for follow-up of elevated cholesterol. Doing well without complaints on current medication. Denies side effects  including myalgia and arthralgia and nausea. Also in today for liver function testing. Currently no chest pain, shortness of breath or other cardiovascular related symptoms noted.  Follow-up of diabetes. Patient does check blood sugar at home.  He is from himself today 40 units twice a day as well taking approximately 20 units premeal preprandially.  He also uses about 8 units if his sugar goes above 200 between meals.  He has had a couple of random lows per month.  He is able to manage that by eating something.  He feels like his sugar is trending downward.  Patient denies symptoms such as excessive hunger or urinary frequency, excessive hunger, nausea No significant hypoglycemic spells noted. Medications reviewed. Pt reports taking them regularly. Pt. denies complication/adverse reaction today.   Charles Foley has an upcoming back surgery through [employer] [employer].  He had an injury in September.  He has an oral steroid injection coming up next week.  Apparently there is a compression of lumbar 5 he tells me.  The pain radiates down the posterior lateral right thigh.  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 History Charles Foley has a past medical history of Acute pain of right knee (06/17/2017), Allergic rhinitis, Anxiety, Arthritis, Cancer ([place]) (2012), Chest pain, and Diabetes mellitus without complication ([place]).   He has a past surgical  history that includes Prostatectomy (2012); Arthroplasty (Bilateral); and ORIF facial fracture (1988).   His family history includes Diabetes in his mother; Heart disease in his mother.He reports that he quit smoking about 11 years ago. He quit smokeless tobacco use about 17 years ago. He reports current alcohol use. He reports that he does not use drugs.  Current Outpatient Medications on File Prior to Visit  Medication Sig Dispense Refill  . acetaminophen (TYLENOL) 325 MG tablet Take 650 mg by mouth every 6 (six) hours as needed for pain.    . Biotin 1000 MCG tablet Take 1,000 mcg by mouth daily.     . cetirizine (ZYRTEC) 10 MG tablet Take 1 tablet (10 mg total) by mouth daily. 90 tablet 3  . clobetasol (TEMOVATE) 0.05 % external solution Apply 1 application topically 2 (two) times daily. 50 mL 0  . Continuous Blood Gluc Sensor (FREESTYLE LIBRE 2 SENSOR) MISC by Does not apply route.     Marland Kitchen glucose blood (ONETOUCH VERIO) test strip Check Blood Sugars 1 to 2 times daily. 100 each 12  . ibuprofen (ADVIL,MOTRIN) 200 MG tablet Take 200 mg by mouth every 6 (six) hours as needed for pain.    . Multiple Vitamin (MULTIVITAMIN) tablet Take 1 tablet by mouth daily.    . nabumetone (RELAFEN) 500 MG tablet Take 2 tablets (1,000 mg total) by mouth 2 (two) times daily. For muscle and joint pain 360 tablet 1  . pantoprazole (PROTONIX) 40 MG tablet Take 1 tablet (40 mg total) by mouth daily. 90 tablet 3  No current facility-administered medications on file prior to visit.    ROS Review of Systems  Objective:  BP 130/85   Pulse 78   Temp 98.1 F (36.7 C) (Temporal)   Ht 5' 9"  (1.753 m)   Wt 196 lb (88.9 kg)   BMI 28.94 kg/m   BP Readings from Last 3 Encounters:  08/06/20 130/85  05/02/20 139/74  01/31/20 (!) 146/73    Wt Readings from Last 3 Encounters:  08/06/20 196 lb (88.9 kg)  05/02/20 195 lb 9.6 oz (88.7 kg)  01/31/20 197 lb (89.4 kg)     Physical Exam  Diabetic Foot Exam -  Simple   Simple Foot Form Visual Inspection No deformities, no ulcerations, no other skin breakdown bilaterally: Yes See comments: Yes Sensation Testing Intact to touch and monofilament testing bilaterally: Yes Pulse Check Posterior Tibialis and Dorsalis pulse intact bilaterally: Yes Comments There is some mild callus formation around the heel.  We discussed proper care with lotions, soaking, light abrasion with pumice.       Assessment & Plan:   Charles Foley was seen today for follow-up.  Diagnoses and all orders for this visit:  Type 1 diabetes mellitus without complication (HCC) -     CBC with Differential/Platelet -     CMP14+EGFR -     Lipid panel -     Bayer DCA Hb A1c Waived -     insulin lispro (HUMALOG KWIKPEN) 100 UNIT/ML KwikPen; INJECT UP TO 20 UNITS TIS BEFORE MEALS -     atorvastatin (LIPITOR) 10 MG tablet; Take 1 tablet (10 mg total) by mouth daily.  Hypothyroidism, unspecified type -     CBC with Differential/Platelet -     CMP14+EGFR -     Lipid panel -     Thyroid Panel With TSH -     levothyroxine (SYNTHROID) 25 MCG tablet; Take 1 tablet (25 mcg total) by mouth daily.  Essential hypertension -     CBC with Differential/Platelet -     CMP14+EGFR -     Lipid panel -     lisinopril (ZESTRIL) 20 MG tablet; Take 1 tablet (20 mg total) by mouth daily.  GAD (generalized anxiety disorder) -     CBC with Differential/Platelet -     CMP14+EGFR -     Lipid panel -     busPIRone (BUSPAR) 10 MG tablet; Take 1 tablet (10 mg total) by mouth 3 (three) times daily. -     ALPRAZolam (XANAX) 1 MG tablet; Take 1 tablet (1 mg total) by mouth at bedtime as needed. for sleep  Controlled substance agreement signed -     CBC with Differential/Platelet -     CMP14+EGFR -     Lipid panel  Other osteoarthritis involving multiple joints -     cyclobenzaprine (FLEXERIL) 10 MG tablet; Take 1 tablet (10 mg total) by mouth 3 (three) times daily as needed for muscle  spasms.  Screen for colon cancer -     Ambulatory referral to Gastroenterology  Other orders -     insulin glargine, 2 Unit Dial, (TOUJEO MAX SOLOSTAR) 300 UNIT/ML Solostar Pen; Inject 40 Units into the skin in the morning and at bedtime.   I am having Charles Land "[name]" maintain his acetaminophen, ibuprofen, Biotin, multivitamin, nabumetone, cetirizine, clobetasol, pantoprazole, FreeStyle Libre 2 Sensor, OneTouch Verio, lisinopril, levothyroxine, insulin lispro, Toujeo Max SoloStar, cyclobenzaprine, busPIRone, atorvastatin, and ALPRAZolam.  Meds ordered this encounter  Medications  . lisinopril (ZESTRIL) 20 MG  tablet    Sig: Take 1 tablet (20 mg total) by mouth daily.    Dispense:  90 tablet    Refill:  1  . levothyroxine (SYNTHROID) 25 MCG tablet    Sig: Take 1 tablet (25 mcg total) by mouth daily.    Dispense:  90 tablet    Refill:  1  . insulin lispro (HUMALOG KWIKPEN) 100 UNIT/ML KwikPen    Sig: INJECT UP TO 20 UNITS TIS BEFORE MEALS    Dispense:  30 mL    Refill:  1    $  . insulin glargine, 2 Unit Dial, (TOUJEO MAX SOLOSTAR) 300 UNIT/ML Solostar Pen    Sig: Inject 40 Units into the skin in the morning and at bedtime.    Dispense:  9 mL    Refill:  11  . cyclobenzaprine (FLEXERIL) 10 MG tablet    Sig: Take 1 tablet (10 mg total) by mouth 3 (three) times daily as needed for muscle spasms.    Dispense:  90 tablet    Refill:  5    $  . busPIRone (BUSPAR) 10 MG tablet    Sig: Take 1 tablet (10 mg total) by mouth 3 (three) times daily.    Dispense:  90 tablet    Refill:  5  . atorvastatin (LIPITOR) 10 MG tablet    Sig: Take 1 tablet (10 mg total) by mouth daily.    Dispense:  90 tablet    Refill:  1  . ALPRAZolam (XANAX) 1 MG tablet    Sig: Take 1 tablet (1 mg total) by mouth at bedtime as needed. for sleep    Dispense:  30 tablet    Refill:  5     Follow-up: Return in about 3 months (around 11/06/2020).  Claretta Fraise, M.D.

## 2020-08-07 LAB — CBC WITH DIFFERENTIAL/PLATELET
Basophils Absolute: 0.1 10*3/uL (ref 0.0–0.2)
Basos: 1 %
EOS (ABSOLUTE): 0.3 10*3/uL (ref 0.0–0.4)
Eos: 4 %
Hematocrit: 44.3 % (ref 37.5–51.0)
Hemoglobin: 15.3 g/dL (ref 13.0–17.7)
Immature Grans (Abs): 0 10*3/uL (ref 0.0–0.1)
Immature Granulocytes: 0 %
Lymphocytes Absolute: 2.9 10*3/uL (ref 0.7–3.1)
Lymphs: 39 %
MCH: 31.8 pg (ref 26.6–33.0)
MCHC: 34.5 g/dL (ref 31.5–35.7)
MCV: 92 fL (ref 79–97)
Monocytes Absolute: 0.5 10*3/uL (ref 0.1–0.9)
Monocytes: 6 %
Neutrophils Absolute: 3.8 10*3/uL (ref 1.4–7.0)
Neutrophils: 50 %
Platelets: 275 10*3/uL (ref 150–450)
RBC: 4.81 x10E6/uL (ref 4.14–5.80)
RDW: 12.1 % (ref 11.6–15.4)
WBC: 7.6 10*3/uL (ref 3.4–10.8)

## 2020-08-07 LAB — CMP14+EGFR
ALT: 28 IU/L (ref 0–44)
AST: 22 IU/L (ref 0–40)
Albumin/Globulin Ratio: 1.8 (ref 1.2–2.2)
Albumin: 4.9 g/dL — ABNORMAL HIGH (ref 3.8–4.8)
Alkaline Phosphatase: 63 IU/L (ref 44–121)
BUN/Creatinine Ratio: 13 (ref 10–24)
BUN: 12 mg/dL (ref 8–27)
Bilirubin Total: 0.9 mg/dL (ref 0.0–1.2)
CO2: 21 mmol/L (ref 20–29)
Calcium: 9.9 mg/dL (ref 8.6–10.2)
Chloride: 102 mmol/L (ref 96–106)
Creatinine, Ser: 0.91 mg/dL (ref 0.76–1.27)
GFR calc Af Amer: 105 mL/min/{1.73_m2} (ref >59)
GFR calc non Af Amer: 91 mL/min/{1.73_m2} (ref >59)
Globulin, Total: 2.7 g/dL (ref 1.5–4.5)
Glucose: 233 mg/dL — ABNORMAL HIGH (ref 65–99)
Potassium: 5.2 mmol/L (ref 3.5–5.2)
Sodium: 138 mmol/L (ref 134–144)
Total Protein: 7.6 g/dL (ref 6.0–8.5)

## 2020-08-07 LAB — LIPID PANEL
Chol/HDL Ratio: 2.7 ratio (ref 0.0–5.0)
Cholesterol, Total: 157 mg/dL (ref 100–199)
HDL: 59 mg/dL (ref >39)
LDL Chol Calc (NIH): 84 mg/dL (ref 0–99)
Triglycerides: 72 mg/dL (ref 0–149)
VLDL Cholesterol Cal: 14 mg/dL (ref 5–40)

## 2020-08-07 LAB — THYROID PANEL WITH TSH
Free Thyroxine Index: 2.5 (ref 1.2–4.9)
T3 Uptake Ratio: 29 % (ref 24–39)
T4, Total: 8.6 ug/dL (ref 4.5–12.0)
TSH: 2.71 u[IU]/mL (ref 0.450–4.500)

## 2020-09-26 ENCOUNTER — Other Ambulatory Visit: Payer: Self-pay | Admitting: Orthopedic Surgery

## 2020-10-09 ENCOUNTER — Other Ambulatory Visit (HOSPITAL_COMMUNITY)
Admission: RE | Admit: 2020-10-09 | Discharge: 2020-10-09 | Disposition: A | Discharge status: Home / Self Care | Payer: 59 | Source: Ambulatory Visit | Attending: Orthopedic Surgery | Admitting: Orthopedic Surgery

## 2020-10-09 ENCOUNTER — Encounter (HOSPITAL_COMMUNITY): Payer: Self-pay

## 2020-10-09 ENCOUNTER — Other Ambulatory Visit: Payer: Self-pay

## 2020-10-09 ENCOUNTER — Encounter (HOSPITAL_COMMUNITY)
Admission: RE | Admit: 2020-10-09 | Discharge: 2020-10-09 | Disposition: A | Discharge status: Home / Self Care | Payer: Worker's Compensation | Source: Ambulatory Visit | Attending: Orthopedic Surgery | Admitting: Orthopedic Surgery

## 2020-10-09 DIAGNOSIS — Z20822 Contact with and (suspected) exposure to covid-19: Secondary | ICD-10-CM | POA: Diagnosis not present

## 2020-10-09 DIAGNOSIS — Z01812 Encounter for preprocedural laboratory examination: Secondary | ICD-10-CM | POA: Diagnosis not present

## 2020-10-09 HISTORY — DX: Essential (primary) hypertension: I10

## 2020-10-09 HISTORY — DX: Hypothyroidism, unspecified: E03.9

## 2020-10-09 LAB — COMPREHENSIVE METABOLIC PANEL
ALT: 41 U/L (ref 0–44)
AST: 30 U/L (ref 15–41)
Albumin: 4.5 g/dL (ref 3.5–5.0)
Alkaline Phosphatase: 51 U/L (ref 38–126)
Anion gap: 11 (ref 5–15)
BUN: 10 mg/dL (ref 8–23)
CO2: 27 mmol/L (ref 22–32)
Calcium: 9.9 mg/dL (ref 8.9–10.3)
Chloride: 100 mmol/L (ref 98–111)
Creatinine, Ser: 0.87 mg/dL (ref 0.61–1.24)
GFR, Estimated: >60 mL/min (ref >60)
Glucose, Bld: 208 mg/dL — ABNORMAL HIGH (ref 70–99)
Potassium: 4.1 mmol/L (ref 3.5–5.1)
Sodium: 138 mmol/L (ref 135–145)
Total Bilirubin: 1.2 mg/dL (ref 0.3–1.2)
Total Protein: 8.5 g/dL — ABNORMAL HIGH (ref 6.5–8.1)

## 2020-10-09 LAB — TYPE AND SCREEN
ABO/RH(D): A NEG
Antibody Screen: NEGATIVE

## 2020-10-09 LAB — CBC WITH DIFFERENTIAL/PLATELET
Abs Immature Granulocytes: 0.03 10*3/uL (ref 0.00–0.07)
Basophils Absolute: 0.1 10*3/uL (ref 0.0–0.1)
Basophils Relative: 1 %
Eosinophils Absolute: 0.3 10*3/uL (ref 0.0–0.5)
Eosinophils Relative: 4 %
HCT: 48.8 % (ref 39.0–52.0)
Hemoglobin: 16.3 g/dL (ref 13.0–17.0)
Immature Granulocytes: 1 %
Lymphocytes Relative: 33 %
Lymphs Abs: 2 10*3/uL (ref 0.7–4.0)
MCH: 30.9 pg (ref 26.0–34.0)
MCHC: 33.4 g/dL (ref 30.0–36.0)
MCV: 92.6 fL (ref 80.0–100.0)
Monocytes Absolute: 0.7 10*3/uL (ref 0.1–1.0)
Monocytes Relative: 11 %
Neutro Abs: 3 10*3/uL (ref 1.7–7.7)
Neutrophils Relative %: 50 %
Platelets: 291 10*3/uL (ref 150–400)
RBC: 5.27 MIL/uL (ref 4.22–5.81)
RDW: 12.4 % (ref 11.5–15.5)
WBC: 6.1 10*3/uL (ref 4.0–10.5)
nRBC: 0 % (ref 0.0–0.2)

## 2020-10-09 LAB — URINALYSIS, ROUTINE W REFLEX MICROSCOPIC
Bilirubin Urine: NEGATIVE
Glucose, UA: NEGATIVE mg/dL
Hgb urine dipstick: NEGATIVE
Ketones, ur: 5 mg/dL — AB
Leukocytes,Ua: NEGATIVE
Nitrite: NEGATIVE
Protein, ur: NEGATIVE mg/dL
Specific Gravity, Urine: 1.011 (ref 1.005–1.030)
pH: 6 (ref 5.0–8.0)

## 2020-10-09 LAB — HEMOGLOBIN A1C
Hgb A1c MFr Bld: 7.6 % — ABNORMAL HIGH (ref 4.8–5.6)
Mean Plasma Glucose: 171.42 mg/dL

## 2020-10-09 LAB — SARS CORONAVIRUS 2 (TAT 6-24 HRS): SARS Coronavirus 2: NEGATIVE

## 2020-10-09 LAB — SURGICAL PCR SCREEN
MRSA, PCR: POSITIVE — AB
Staphylococcus aureus: POSITIVE — AB

## 2020-10-09 LAB — GLUCOSE, CAPILLARY: Glucose-Capillary: 153 mg/dL — ABNORMAL HIGH (ref 70–99)

## 2020-10-09 NOTE — Progress Notes (Addendum)
PCP - Dr. Claretta Fraise  Cardiologist - no  Chest x-ray - na  EKG - 10/09/20 Stress Test -   ECHO - no  Cardiac Cath -   Sleep Study - no CPAP - no  LABS-CBC with diff, CMP, T/S, UA, PCR. PT, PTT clotted, will be drawn the morning of surgery. PCR positive for MRSA/ Staph  ASA-no  ERAS-yes  HA1C-10/09/20- 7.6 Fasting Blood Sugar - 68-90  pc- 200's Checks Blood Sugar _has a Libre 2- continuous monitor  Anesthesia-  Pt denies having chest pain, sob, or fever at this time. All instructions explained to the pt, with a verbal understanding of the material. Pt agrees to go over the instructions while at home for a better understanding. Pt also instructed to self quarantine after being tested for COVID-19. The opportunity to ask questions was provided.

## 2020-10-09 NOTE — Pre-Procedure Instructions (Addendum)
Charles Foley  10/09/2020     Your procedure is scheduled on October 11, 2020  Report to [facility] Admitting at 11:00 A.M.- Per Butch Penny                 Your surgery or procedure is scheduled to begin at 1: 00 PM   Call this number if you have problems the morning of surgery: [phone]  This is the number for the Pre- Surgical Desk.                For any other questions, please call [phone], Monday - Friday 8 AM - 4 PM.   Remember:  Do not eat after midnight Wednesday, January 19.  You may drink clear liquids until 10:00    Clear liquids allowed are:    Water, Juice (non-citric and without pulp - diabetics please choose diet or no sugar options), Carbonated beverages - (diabetics please choose diet or no sugar options), Clear Tea, Black Coffee only (no creamer, milk or cream including half and half), Plain Jell-O only (diabetics please choose diet or no sugar options), Gatorade (diabetics please choose diet or no sugar options) and Plain Popsicles only  Drink the 10 oz bottle iof water between 9:00 AM and 10:00 AM  Take these medicines the morning of surgery with A SIP OF WATER: Tylenol if needed Tylenol.  STOP taking Aspirin, Aspirin Products (Goody Powder, Excedrin Migraine), Ibuprofen (Advil), Naproxen (Aleve),nabumetone (RELAFEN), Vitamins and Herbal Products (ie Fish Oil).   WHAT DO I DO ABOUT MY DIABETES MEDICATION  . THE 30 units, take 30 units of TOUJEO insulin. Before you leave home.       How to Manage Your Diabetes Before and After Surgery  Why is it important to control my blood sugar before and after surgery? . Improving blood sugar levels before and after surgery helps healing and can limit problems. . A way of improving blood sugar control is eating a healthy diet by: o  Eating less sugar and carbohydrates o  Increasing activity/exercise o  Talking with your doctor about reaching your blood sugar goals . High blood sugars (greater than 180 mg/dL)  can raise your risk of infections and slow your recovery, so you will need to focus on controlling your diabetes during the weeks before surgery. . Make sure that the doctor who takes care of your diabetes knows about your planned surgery including the date and location.  How do I manage my blood sugar before surgery? . Check your blood sugar at least 4 times a day, starting 2 days before surgery, to make sure that the level is not too high or low. o Check your blood sugar the morning of your surgery when you wake up and every 2 hours until you get to the Short Stay unit. . If your blood sugar is less than 70 mg/dL, you will need to treat for low blood sugar: o Do not take insulin. o Treat a low blood sugar (less than 70 mg/dL) with  cup of clear juice (cranberry or apple), 4 glucose tablets, OR glucose gel. Recheck blood sugar in 15 minutes after treatment (to make sure it is greater than 70 mg/dL). If your blood sugar is not greater than 70 mg/dL on recheck, call [phone] o  for further instructions. . Report your blood sugar to the short stay nurse when you get to Short Stay.  . If you are admitted to the hospital after surgery: o Your  blood sugar will be checked by the staff and you will probably be given insulin after surgery (instead of oral diabetes medicines) to make sure you have good blood sugar levels. o The goal for blood sugar control after surgery is 80-180 mg/dL    Special instructions:    [place]- Preparing For Surgery  Before surgery, you can play an important role. Because skin is not sterile, your skin needs to be as free of germs as possible. You can reduce the number of germs on your skin by washing with CHG (chlorahexidine gluconate) Soap before surgery.  CHG is an antiseptic cleaner which kills germs and bonds with the skin to continue killing germs even after washing.    Oral Hygiene is also important to reduce your risk of infection.  Remember - BRUSH YOUR  TEETH THE MORNING OF SURGERY WITH YOUR REGULAR TOOTHPASTE  Please do not use if you have an allergy to CHG or antibacterial soaps. If your skin becomes reddened/irritated stop using the CHG.  Do not shave (including legs and underarms) for at least 48 hours prior to first CHG shower. It is OK to shave your face.  Please follow these instructions carefully.   1. Shower the NIGHT BEFORE SURGERY and the MORNING OF SURGERY with CHG.   2. If you chose to wash your hair, wash your hair first as usual with your normal shampoo.  3. After you shampoo, wash your face and private area with the soap you use at home, then rinse your hair and body thoroughly to remove the shampoo and soap.  4. Use CHG as you would any other liquid soap. You can apply CHG directly to the skin and wash gently with a scrungie or a clean washcloth.   5. Apply the CHG Soap to your body ONLY FROM THE NECK DOWN.  Do not use on open wounds or open sores. Avoid contact with your eyes, ears, mouth and genitals (private parts).   6. Wash thoroughly, paying special attention to the area where your surgery will be performed.  7. Thoroughly rinse your body with warm water from the neck down.  8. DO NOT shower/wash with your normal soap after using and rinsing off the CHG Soap.  9. Pat yourself dry with a CLEAN TOWEL.  10. Wear CLEAN PAJAMAS to bed the night before surgery, wear comfortable clothes the morning of surgery  11. Place CLEAN SHEETS on your bed the night of your first shower and DO NOT SLEEP WITH PETS.  Day of Surgery: Shower as instructed above. Do not apply any deodorants/lotions, powders or colognes.  Please wear clean clothes to the hospital/surgery center.   Remember to brush your teeth WITH YOUR REGULAR TOOTHPASTE.  Do not wear jewelry, make-up or nail polish.  Do not shave 48 hours prior to surgery.  Men may shave face and neck.  Do not bring valuables to the hospital.  [facility] is not responsible for  any belongings or valuables.  Contacts, dentures or bridgework may not be worn into surgery.  Leave your suitcase in the car.  After surgery it may be brought to your room.  For patients admitted to the hospital, discharge time will be determined by your treatment team.  Patients discharged the day of surgery will not be allowed to drive home.   Please read over the fact sheets that you were given.

## 2020-10-10 NOTE — Anesthesia Preprocedure Evaluation (Addendum)
Anesthesia Evaluation  Patient identified by MRN, date of birth, ID band Patient awake    Reviewed: Allergy & Precautions, NPO status , Patient's Chart, lab work & pertinent test results  Airway Mallampati: III  TM Distance: >3 FB Neck ROM: Full    Dental  (+) Missing   Pulmonary former smoker,    Pulmonary exam normal breath sounds clear to auscultation       Cardiovascular hypertension, Pt. on medications Normal cardiovascular exam Rhythm:Regular Rate:Normal  ECG: NSR, rate 70   Neuro/Psych PSYCHIATRIC DISORDERS Anxiety negative neurological ROS     GI/Hepatic Neg liver ROS, GERD  Medicated and Controlled,  Endo/Other  diabetes, Insulin DependentHypothyroidism   Renal/GU negative Renal ROS     Musculoskeletal negative musculoskeletal ROS (+)   Abdominal   Peds  Hematology HLD   Anesthesia Other Findings SEVERE SPINAL STENOSIS AT LUMBAR 4-LUMBAR 5  Reproductive/Obstetrics                           Anesthesia Physical Anesthesia Plan  ASA: III  Anesthesia Plan: General   Post-op Pain Management:    Induction: Intravenous  PONV Risk Score and Plan: 2 and Ondansetron, Dexamethasone, Midazolam and Treatment may vary due to age or medical condition  Airway Management Planned: Oral ETT  Additional Equipment:   Intra-op Plan:   Post-operative Plan: Extubation in OR  Informed Consent: I have reviewed the patients History and Physical, chart, labs and discussed the procedure including the risks, benefits and alternatives for the proposed anesthesia with the patient or authorized representative who has indicated his/her understanding and acceptance.     Dental advisory given  Plan Discussed with: CRNA  Anesthesia Plan Comments: (Precedex)      Anesthesia Quick Evaluation

## 2020-10-11 ENCOUNTER — Ambulatory Visit (HOSPITAL_COMMUNITY): Payer: Worker's Compensation | Admitting: Physician Assistant

## 2020-10-11 ENCOUNTER — Ambulatory Visit (HOSPITAL_COMMUNITY)
Admission: RE | Admit: 2020-10-11 | Discharge: 2020-10-11 | Disposition: A | Discharge status: Home / Self Care | Payer: Worker's Compensation | Attending: Orthopedic Surgery | Admitting: Orthopedic Surgery

## 2020-10-11 ENCOUNTER — Ambulatory Visit: Payer: Self-pay

## 2020-10-11 ENCOUNTER — Other Ambulatory Visit: Payer: Self-pay

## 2020-10-11 ENCOUNTER — Encounter (HOSPITAL_COMMUNITY): Payer: Self-pay | Admitting: Orthopedic Surgery

## 2020-10-11 ENCOUNTER — Encounter (HOSPITAL_COMMUNITY)
Admission: RE | Disposition: A | Discharge status: Home / Self Care | Payer: Self-pay | Source: Home / Self Care | Attending: Orthopedic Surgery

## 2020-10-11 ENCOUNTER — Ambulatory Visit (HOSPITAL_COMMUNITY): Payer: Worker's Compensation

## 2020-10-11 ENCOUNTER — Ambulatory Visit (HOSPITAL_COMMUNITY): Payer: Worker's Compensation | Admitting: Vascular Surgery

## 2020-10-11 DIAGNOSIS — Z794 Long term (current) use of insulin: Secondary | ICD-10-CM | POA: Insufficient documentation

## 2020-10-11 DIAGNOSIS — Z791 Long term (current) use of non-steroidal anti-inflammatories (NSAID): Secondary | ICD-10-CM | POA: Insufficient documentation

## 2020-10-11 DIAGNOSIS — Z419 Encounter for procedure for purposes other than remedying health state, unspecified: Secondary | ICD-10-CM

## 2020-10-11 DIAGNOSIS — M5116 Intervertebral disc disorders with radiculopathy, lumbar region: Secondary | ICD-10-CM | POA: Diagnosis not present

## 2020-10-11 DIAGNOSIS — Z79899 Other long term (current) drug therapy: Secondary | ICD-10-CM | POA: Diagnosis not present

## 2020-10-11 DIAGNOSIS — Z87891 Personal history of nicotine dependence: Secondary | ICD-10-CM | POA: Insufficient documentation

## 2020-10-11 DIAGNOSIS — Z833 Family history of diabetes mellitus: Secondary | ICD-10-CM | POA: Diagnosis not present

## 2020-10-11 DIAGNOSIS — E119 Type 2 diabetes mellitus without complications: Secondary | ICD-10-CM | POA: Diagnosis not present

## 2020-10-11 DIAGNOSIS — Z8249 Family history of ischemic heart disease and other diseases of the circulatory system: Secondary | ICD-10-CM | POA: Insufficient documentation

## 2020-10-11 DIAGNOSIS — M48062 Spinal stenosis, lumbar region with neurogenic claudication: Secondary | ICD-10-CM | POA: Insufficient documentation

## 2020-10-11 DIAGNOSIS — Z8546 Personal history of malignant neoplasm of prostate: Secondary | ICD-10-CM | POA: Insufficient documentation

## 2020-10-11 HISTORY — PX: LUMBAR LAMINECTOMY/DECOMPRESSION MICRODISCECTOMY: SHX5026

## 2020-10-11 LAB — APTT: aPTT: 24 seconds (ref 24–36)

## 2020-10-11 LAB — PROTIME-INR
INR: 1 (ref 0.8–1.2)
Prothrombin Time: 12.6 seconds (ref 11.4–15.2)

## 2020-10-11 LAB — GLUCOSE, CAPILLARY
Glucose-Capillary: 226 mg/dL — ABNORMAL HIGH (ref 70–99)
Glucose-Capillary: 238 mg/dL — ABNORMAL HIGH (ref 70–99)

## 2020-10-11 SURGERY — LUMBAR LAMINECTOMY/DECOMPRESSION MICRODISCECTOMY
Anesthesia: General | Site: Spine Lumbar

## 2020-10-11 MED ORDER — PROMETHAZINE HCL 25 MG/ML IJ SOLN: 6.2500 mg | INTRAMUSCULAR | Status: DC | PRN

## 2020-10-11 MED ORDER — INSULIN ASPART 100 UNIT/ML ~~LOC~~ SOLN
SUBCUTANEOUS | Status: AC
  Filled 2020-10-11: qty 1

## 2020-10-11 MED ORDER — BUPIVACAINE-EPINEPHRINE (PF) 0.25% -1:200000 IJ SOLN
INTRAMUSCULAR | Status: AC
  Filled 2020-10-11: qty 10

## 2020-10-11 MED ORDER — CEFAZOLIN SODIUM-DEXTROSE 2-4 GM/100ML-% IV SOLN
2.0000 g | INTRAVENOUS | Status: AC
  Administered 2020-10-11: 2 g via INTRAVENOUS
  Filled 2020-10-11: qty 100

## 2020-10-11 MED ORDER — EPHEDRINE 5 MG/ML INJ
INTRAVENOUS | Status: AC
  Filled 2020-10-11: qty 10

## 2020-10-11 MED ORDER — PROPOFOL 10 MG/ML IV BOLUS
INTRAVENOUS | Status: AC
  Filled 2020-10-11: qty 20

## 2020-10-11 MED ORDER — LACTATED RINGERS IV SOLN: INTRAVENOUS | Status: DC

## 2020-10-11 MED ORDER — HYDROMORPHONE HCL 1 MG/ML IJ SOLN
INTRAMUSCULAR | Status: AC
  Filled 2020-10-11: qty 1

## 2020-10-11 MED ORDER — DEXAMETHASONE SODIUM PHOSPHATE 10 MG/ML IJ SOLN
INTRAMUSCULAR | Status: AC
  Filled 2020-10-11: qty 1

## 2020-10-11 MED ORDER — ACETAMINOPHEN 500 MG PO TABS
1000.0000 mg | ORAL_TABLET | Freq: Once | ORAL | Status: AC
  Administered 2020-10-11: 1000 mg via ORAL
  Filled 2020-10-11: qty 2

## 2020-10-11 MED ORDER — LIDOCAINE 2% (20 MG/ML) 5 ML SYRINGE
INTRAMUSCULAR | Status: DC | PRN
  Administered 2020-10-11: 60 mg via INTRAVENOUS

## 2020-10-11 MED ORDER — CHLORHEXIDINE GLUCONATE 0.12 % MT SOLN
15.0000 mL | Freq: Once | OROMUCOSAL | Status: AC
  Administered 2020-10-11: 15 mL via OROMUCOSAL
  Filled 2020-10-11: qty 15

## 2020-10-11 MED ORDER — OXYCODONE-ACETAMINOPHEN 5-325 MG PO TABS: 1.0000 | ORAL_TABLET | ORAL | 0 refills | Status: AC | PRN

## 2020-10-11 MED ORDER — METHYLENE BLUE 0.5 % INJ SOLN
INTRAVENOUS | Status: AC
  Filled 2020-10-11: qty 10

## 2020-10-11 MED ORDER — SUGAMMADEX SODIUM 200 MG/2ML IV SOLN
INTRAVENOUS | Status: DC | PRN
  Administered 2020-10-11: 200 mg via INTRAVENOUS

## 2020-10-11 MED ORDER — OXYCODONE HCL 5 MG PO TABS: 5.0000 mg | ORAL_TABLET | Freq: Once | ORAL | Status: DC | PRN

## 2020-10-11 MED ORDER — 0.9 % SODIUM CHLORIDE (POUR BTL) OPTIME
TOPICAL | Status: DC | PRN
  Administered 2020-10-11 (×2): 1000 mL

## 2020-10-11 MED ORDER — ONDANSETRON HCL 4 MG/2ML IJ SOLN
INTRAMUSCULAR | Status: DC | PRN
  Administered 2020-10-11: 4 mg via INTRAVENOUS

## 2020-10-11 MED ORDER — METHYLPREDNISOLONE ACETATE 40 MG/ML IJ SUSP
INTRAMUSCULAR | Status: DC | PRN
  Administered 2020-10-11: 40 mg

## 2020-10-11 MED ORDER — THROMBIN 20000 UNITS EX SOLR
CUTANEOUS | Status: AC
  Filled 2020-10-11: qty 20000

## 2020-10-11 MED ORDER — DEXAMETHASONE SODIUM PHOSPHATE 10 MG/ML IJ SOLN
INTRAMUSCULAR | Status: DC | PRN
  Administered 2020-10-11: 10 mg via INTRAVENOUS

## 2020-10-11 MED ORDER — OXYCODONE HCL 5 MG/5ML PO SOLN: 5.0000 mg | Freq: Once | ORAL | Status: DC | PRN

## 2020-10-11 MED ORDER — INSULIN ASPART 100 UNIT/ML ~~LOC~~ SOLN
3.0000 [IU] | Freq: Once | SUBCUTANEOUS | Status: AC
  Administered 2020-10-11: 3 [IU] via SUBCUTANEOUS

## 2020-10-11 MED ORDER — THROMBIN 20000 UNITS EX SOLR
CUTANEOUS | Status: DC | PRN
  Administered 2020-10-11: 20000 [IU] via TOPICAL

## 2020-10-11 MED ORDER — FENTANYL CITRATE (PF) 250 MCG/5ML IJ SOLN
INTRAMUSCULAR | Status: AC
  Filled 2020-10-11: qty 5

## 2020-10-11 MED ORDER — BUPIVACAINE-EPINEPHRINE 0.25% -1:200000 IJ SOLN
INTRAMUSCULAR | Status: DC | PRN
  Administered 2020-10-11: 7 mL

## 2020-10-11 MED ORDER — PROPOFOL 10 MG/ML IV BOLUS
INTRAVENOUS | Status: DC | PRN
  Administered 2020-10-11: 150 mg via INTRAVENOUS

## 2020-10-11 MED ORDER — EPHEDRINE SULFATE 50 MG/ML IJ SOLN
INTRAMUSCULAR | Status: DC | PRN
  Administered 2020-10-11 (×2): 5 mg via INTRAVENOUS

## 2020-10-11 MED ORDER — POVIDONE-IODINE 7.5 % EX SOLN: Freq: Once | CUTANEOUS | Status: DC

## 2020-10-11 MED ORDER — MIDAZOLAM HCL 2 MG/2ML IJ SOLN
INTRAMUSCULAR | Status: AC
  Filled 2020-10-11: qty 2

## 2020-10-11 MED ORDER — HYDROMORPHONE HCL 1 MG/ML IJ SOLN
0.2500 mg | INTRAMUSCULAR | Status: DC | PRN
  Administered 2020-10-11 (×3): 0.5 mg via INTRAVENOUS

## 2020-10-11 MED ORDER — FENTANYL CITRATE (PF) 250 MCG/5ML IJ SOLN
INTRAMUSCULAR | Status: DC | PRN
  Administered 2020-10-11 (×5): 50 ug via INTRAVENOUS

## 2020-10-11 MED ORDER — METHYLENE BLUE 0.5 % INJ SOLN
INTRAVENOUS | Status: DC | PRN
  Administered 2020-10-11: 1 mL

## 2020-10-11 MED ORDER — LIDOCAINE 2% (20 MG/ML) 5 ML SYRINGE
INTRAMUSCULAR | Status: AC
  Filled 2020-10-11: qty 5

## 2020-10-11 MED ORDER — HEMOSTATIC AGENTS (NO CHARGE) OPTIME
TOPICAL | Status: DC | PRN
  Administered 2020-10-11: 1 via TOPICAL

## 2020-10-11 MED ORDER — METHYLPREDNISOLONE ACETATE 80 MG/ML IJ SUSP
INTRAMUSCULAR | Status: AC
  Filled 2020-10-11: qty 1

## 2020-10-11 MED ORDER — EPHEDRINE SULFATE-NACL 50-0.9 MG/10ML-% IV SOSY
PREFILLED_SYRINGE | INTRAVENOUS | Status: DC | PRN
  Administered 2020-10-11: 10 mg via INTRAVENOUS
  Administered 2020-10-11: 20 mg via INTRAVENOUS
  Administered 2020-10-11: 10 mg via INTRAVENOUS

## 2020-10-11 MED ORDER — MIDAZOLAM HCL 2 MG/2ML IJ SOLN
INTRAMUSCULAR | Status: DC | PRN
  Administered 2020-10-11: 2 mg via INTRAVENOUS

## 2020-10-11 MED ORDER — ROCURONIUM BROMIDE 10 MG/ML (PF) SYRINGE
PREFILLED_SYRINGE | INTRAVENOUS | Status: AC
  Filled 2020-10-11: qty 10

## 2020-10-11 MED ORDER — ONDANSETRON HCL 4 MG/2ML IJ SOLN
INTRAMUSCULAR | Status: AC
  Filled 2020-10-11: qty 2

## 2020-10-11 MED ORDER — ROCURONIUM BROMIDE 10 MG/ML (PF) SYRINGE
PREFILLED_SYRINGE | INTRAVENOUS | Status: DC | PRN
  Administered 2020-10-11: 100 mg via INTRAVENOUS

## 2020-10-11 MED ORDER — METHOCARBAMOL 500 MG PO TABS: 500.0000 mg | ORAL_TABLET | Freq: Four times a day (QID) | ORAL | 0 refills | Status: DC | PRN

## 2020-10-11 MED ORDER — ORAL CARE MOUTH RINSE: 15.0000 mL | Freq: Once | OROMUCOSAL | Status: AC

## 2020-10-11 MED ORDER — BUPIVACAINE LIPOSOME 1.3 % IJ SUSP
20.0000 mL | Freq: Once | INTRAMUSCULAR | Status: AC
  Administered 2020-10-11: 20 mL
  Filled 2020-10-11: qty 20

## 2020-10-11 SURGICAL SUPPLY — 81 items
AGENT HMST KT MTR STRL THRMB (HEMOSTASIS) ×1
APL SKNCLS STERI-STRIP NONHPOA (GAUZE/BANDAGES/DRESSINGS) ×1
BENZOIN TINCTURE PRP APPL 2/3 (GAUZE/BANDAGES/DRESSINGS) ×1 IMPLANT
BNDG GAUZE ELAST 4 BULKY (GAUZE/BANDAGES/DRESSINGS) ×1 IMPLANT
BUR ROUND PRECISION 4.0 (BURR) ×2 IMPLANT
CABLE BIPOLOR RESECTION CORD (MISCELLANEOUS) ×2 IMPLANT
CANISTER SUCT 3000ML PPV (MISCELLANEOUS) ×2 IMPLANT
CARTRIDGE OIL MAESTRO DRILL (MISCELLANEOUS) ×1 IMPLANT
COVER SURGICAL LIGHT HANDLE (MISCELLANEOUS) ×1 IMPLANT
COVER WAND RF STERILE (DRAPES) ×1 IMPLANT
DIFFUSER DRILL AIR PNEUMATIC (MISCELLANEOUS) ×2 IMPLANT
DRAIN CHANNEL 15F RND FF W/TCR (WOUND CARE) IMPLANT
DRAPE POUCH INSTRU U-SHP 10X18 (DRAPES) ×4 IMPLANT
DRAPE SURG 17X23 STRL (DRAPES) ×8 IMPLANT
DURAPREP 26ML APPLICATOR (WOUND CARE) ×1 IMPLANT
ELECT BLADE 4.0 EZ CLEAN MEGAD (MISCELLANEOUS) ×2
ELECT CAUTERY BLADE 6.4 (BLADE) ×2 IMPLANT
ELECT REM PT RETURN 9FT ADLT (ELECTROSURGICAL) ×2
ELECTRODE BLDE 4.0 EZ CLN MEGD (MISCELLANEOUS) ×1 IMPLANT
ELECTRODE REM PT RTRN 9FT ADLT (ELECTROSURGICAL) ×1 IMPLANT
EVACUATOR SILICONE 100CC (DRAIN) IMPLANT
FILTER STRAW FLUID ASPIR (MISCELLANEOUS) ×2 IMPLANT
GAUZE 4X4 16PLY RFD (DISPOSABLE) ×2 IMPLANT
GAUZE SPONGE 4X4 12PLY STRL (GAUZE/BANDAGES/DRESSINGS) ×2 IMPLANT
GAUZE SPONGE 4X4 12PLY STRL LF (GAUZE/BANDAGES/DRESSINGS) ×1 IMPLANT
GLOVE BIO SURGEON STRL SZ7 (GLOVE) ×3 IMPLANT
GLOVE BIO SURGEON STRL SZ8 (GLOVE) ×2 IMPLANT
GLOVE BIOGEL PI IND STRL 7.0 (GLOVE) ×1 IMPLANT
GLOVE BIOGEL PI INDICATOR 7.0 (GLOVE)
GLOVE ECLIPSE 7.0 STRL STRAW (GLOVE) ×1 IMPLANT
GLOVE SRG 8 PF TXTR STRL LF DI (GLOVE) ×1 IMPLANT
GLOVE SURG SS PI 6.0 STRL IVOR (GLOVE) ×1 IMPLANT
GLOVE SURG UNDER POLY LF SZ6.5 (GLOVE) ×1 IMPLANT
GLOVE SURG UNDER POLY LF SZ7 (GLOVE) ×1 IMPLANT
GLOVE SURG UNDER POLY LF SZ8 (GLOVE) ×2
GOWN STRL REUS W/ TWL LRG LVL3 (GOWN DISPOSABLE) ×1 IMPLANT
GOWN STRL REUS W/ TWL XL LVL3 (GOWN DISPOSABLE) ×2 IMPLANT
GOWN STRL REUS W/TWL LRG LVL3 (GOWN DISPOSABLE) ×6
GOWN STRL REUS W/TWL XL LVL3 (GOWN DISPOSABLE) ×2
IV CATH 14GX2 1/4 (CATHETERS) ×2 IMPLANT
KIT BASIN OR (CUSTOM PROCEDURE TRAY) ×2 IMPLANT
KIT POSITION SURG JACKSON T1 (MISCELLANEOUS) ×2 IMPLANT
KIT TURNOVER KIT B (KITS) ×2 IMPLANT
MARKER SKIN DUAL TIP RULER LAB (MISCELLANEOUS) ×2 IMPLANT
NDL 18GX1X1/2 (RX/OR ONLY) (NEEDLE) ×1 IMPLANT
NDL HYPO 18GX1.5 BLUNT FILL (NEEDLE) IMPLANT
NDL HYPO 25GX1X1/2 BEV (NEEDLE) ×1 IMPLANT
NDL SPNL 18GX3.5 QUINCKE PK (NEEDLE) ×2 IMPLANT
NEEDLE 18GX1X1/2 (RX/OR ONLY) (NEEDLE) ×2 IMPLANT
NEEDLE 22X1 1/2 (OR ONLY) (NEEDLE) ×2 IMPLANT
NEEDLE HYPO 18GX1.5 BLUNT FILL (NEEDLE) ×2 IMPLANT
NEEDLE HYPO 25GX1X1/2 BEV (NEEDLE) ×2 IMPLANT
NEEDLE SPNL 18GX3.5 QUINCKE PK (NEEDLE) ×4 IMPLANT
NS IRRIG 1000ML POUR BTL (IV SOLUTION) ×3 IMPLANT
OIL CARTRIDGE MAESTRO DRILL (MISCELLANEOUS) ×2
PACK LAMINECTOMY ORTHO (CUSTOM PROCEDURE TRAY) ×2 IMPLANT
PACK UNIVERSAL I (CUSTOM PROCEDURE TRAY) ×2 IMPLANT
PAD ARMBOARD 7.5X6 YLW CONV (MISCELLANEOUS) ×5 IMPLANT
PATTIES SURGICAL .5 X.5 (GAUZE/BANDAGES/DRESSINGS) IMPLANT
PATTIES SURGICAL .5 X1 (DISPOSABLE) ×2 IMPLANT
SPONGE INTESTINAL PEANUT (DISPOSABLE) ×2 IMPLANT
SPONGE SURGIFOAM ABS GEL SZ50 (HEMOSTASIS) ×2 IMPLANT
STRIP CLOSURE SKIN 1/2X4 (GAUZE/BANDAGES/DRESSINGS) ×1 IMPLANT
SURGIFLO W/THROMBIN 8M KIT (HEMOSTASIS) ×1 IMPLANT
SUT MNCRL AB 4-0 PS2 18 (SUTURE) ×2 IMPLANT
SUT VIC AB 0 CT1 18XCR BRD 8 (SUTURE) IMPLANT
SUT VIC AB 0 CT1 8-18 (SUTURE)
SUT VIC AB 1 CT1 18XCR BRD 8 (SUTURE) ×1 IMPLANT
SUT VIC AB 1 CT1 8-18 (SUTURE) ×2
SUT VIC AB 2-0 CT2 18 VCP726D (SUTURE) ×2 IMPLANT
SYR 20ML LL LF (SYRINGE) ×2 IMPLANT
SYR 3ML LL SCALE MARK (SYRINGE) ×1 IMPLANT
SYR BULB IRRIG 60ML STRL (SYRINGE) ×2 IMPLANT
SYR CONTROL 10ML LL (SYRINGE) ×4 IMPLANT
SYR TB 1ML 25GX5/8 (SYRINGE) ×3 IMPLANT
SYR TB 1ML LUER SLIP (SYRINGE) ×3 IMPLANT
TAPE CLOTH 4X10 WHT NS (GAUZE/BANDAGES/DRESSINGS) ×1 IMPLANT
TOWEL GREEN STERILE (TOWEL DISPOSABLE) ×2 IMPLANT
TOWEL GREEN STERILE FF (TOWEL DISPOSABLE) ×2 IMPLANT
WATER STERILE IRR 1000ML POUR (IV SOLUTION) ×2 IMPLANT
YANKAUER SUCT BULB TIP NO VENT (SUCTIONS) ×2 IMPLANT

## 2020-10-11 NOTE — Op Note (Signed)
PATIENT NAME: Charles Foley   MEDICAL RECORD NO.:   [id number]   DATE OF BIRTH: 1961-08-16   DATE OF PROCEDURE: 10/11/2020                               OPERATIVE REPORT   PREOPERATIVE DIAGNOSES: 1. Bilateral leg pain. 2. Neurogenic claudication. 3. Severe spinal stenosis, L4-5.  POSTOPERATIVE DIAGNOSES: 1. Bilateral leg pain. 2. Neurogenic claudication. 3. Severe spinal stenosis, L4-5. 4. Broad-based L4-5 disc herniation  PROCEDURE: Complex L4-5 laminectomy, including bilateral partial facetectomy and bilateral foraminotomy, in addition to removal of L4-5 herniated disc fragment  SURGEON:  Phylliss Bob, MD.  ASSISTANTPricilla Holm, PA-C.  ANESTHESIA:  General endotracheal anesthesia.  COMPLICATIONS:  None.  DISPOSITION:  Stable.  ESTIMATED BLOOD LOSS:  Minimal.  INDICATIONS FOR SURGERY:  Briefly, Mr. Charles Foley is a very pleasant 50- year-old male, who I did evaluate status post a work injury. The patient did have ongoing debilitating bilateral leg pain since his injury. The patient's MRI did reveal severe spinal stenosis at L4-5.  In addition, a broad-based disc herniation was noted at L4-5, the clinical correlation of which was unclear.  My plan was to proceed with a bilateral partial facetectomy and a posterior decompression, and to evaluate the intervertebral disc, and if it did appear a subacute fragment was noted, I would proceed with removal of the herniated fragment.  As noted below, a free subacute herniated disc fragment was noted and was removed.Given the patient's ongoing pain and dysfunction, we did discuss proceeding with the procedure reflected above.  The patient was fully aware of the risks and limitations of surgery and did wish to proceed.  OPERATIVE DETAILS:  On 10/11/2020, the patient was brought to surgery and general endotracheal anesthesia was administered.  The patient was placed prone on a well-padded flat Jackson bed with a spinal  frame. Antibiotics were given and the back was prepped and draped in the usual sterile fashion.  A time-out procedure was performed.  I then made a midline incision overlying the L4-5 intervertebral space.  The fascia was incised at the midline.  The paraspinal musculature was bluntly retracted laterally and held retracted with a self-retaining retractor. After confirming the appropriate operative level, I did remove the spinous process of L4.  At this point, I proceeded with a partial facetectomy on the right and left sides.  Of note, there was very substantial and very significant overgrowth of the facet joint bilaterally, and there was also very substantial hypertrophy of the ligamentum flavum.  This was causing very severe spinal stenosis.  The lateral recess stenosis was addressed by using Kerrison punches to thoroughly and decompress the right and left lateral recess.  At the termination of the decompression, I was able to pass a [facility] out the neuro foramen on the right and left sides at the L4-5 level.  I was able to easily pass the [employer] elevator medial to the L5 pedicles as well.  At this point, I turned my attention to the right lateral recess.  The traversing right L5 nerve was identified and gently retracted medially. A protruded fragment was noted, and did appear to be compressive.  I did use a 15 blade knife to perform an annulotomy at the posterior lateral aspect of the intervertebral disc space.  At this point, herniated disc fragments did immediately declare themselves through the annular defect, and were clearly noted to be under  pressure.  With my assistant maintaining gentle medial retraction of the traversing right L5 nerve, I did use straight and up-biting micropituitaries in order to remove additional protruding disc fragments.  Multiple fragments were removed.  Working through the annular defect, I was able to access the contralateral left side, and I did remove  the protruding disc fragments bilaterally, thereby entirely decompressing the spinal canal and traversing bilateral L5 nerves.  I was very pleased with the final decompression that I was able to accomplish. All bleeding was then adequately controlled using Surgi-Flo and bipolar electrocautery.  At this point, 40 mg of Depo-Medrol was introduced about the epidural space.  Prior to this, the wound was copiously irrigated with a total of approximately 2 L of normal saline. Gelfoam was placed over the laminectomy site.  I was very pleased with the decompression.  There was no extravasation of cerebrospinal fluid noted throughout the entire surgery.  At this point, the wound was closed in layers using #1 Vicryl, followed by 2-0 Vicryl, followed by 4- 0 Monocryl.  Benzoin and Steri-Strips were applied, followed by a sterile dressing.  All instrument counts were correct at the termination of the procedure.  Of note, Pricilla Holm, PA-C, was my assistant throughout surgery, and did aid in retraction, suctioning, and closure from start to finish.   Phylliss Bob, MD

## 2020-10-11 NOTE — Transfer of Care (Signed)
Immediate Anesthesia Transfer of Care Note  Patient: Charles Foley  Procedure(s) Performed: LUMBAR FOUR -LUMBAR FIVE DECOMPRESSION (N/A Spine Lumbar)  Patient Location: PACU  Anesthesia Type:General  Level of Consciousness: drowsy and patient cooperative  Airway & Oxygen Therapy: Patient Spontanous Breathing  Post-op Assessment: Report given to RN and Post -op Vital signs reviewed and stable  Post vital signs: Reviewed and stable  Last Vitals:  Vitals Value Taken Time  BP    Temp    Pulse 102 10/11/20 1030  Resp 16 10/11/20 1030  SpO2 97 % 10/11/20 1030  Vitals shown include unvalidated device data.  Last Pain:  Vitals:   10/11/20 0610  TempSrc:   PainSc: 4       Patients Stated Pain Goal: 2 (48/88/91 6945)  Complications: No complications documented.

## 2020-10-11 NOTE — Anesthesia Postprocedure Evaluation (Signed)
Anesthesia Post Note  Patient: Charles Foley  Procedure(s) Performed: LUMBAR FOUR -LUMBAR FIVE DECOMPRESSION (N/A Spine Lumbar)     Patient location during evaluation: PACU Anesthesia Type: General Level of consciousness: awake Pain management: pain level controlled Vital Signs Assessment: post-procedure vital signs reviewed and stable Respiratory status: spontaneous breathing, nonlabored ventilation, respiratory function stable and patient connected to nasal cannula oxygen Cardiovascular status: blood pressure returned to baseline and stable Postop Assessment: no apparent nausea or vomiting Anesthetic complications: no   No complications documented.  Last Vitals:  Vitals:   10/11/20 1100 10/11/20 1115  BP: (!) 173/68 (!) 166/72  Pulse: 98 98  Resp: 13 16  Temp:  36.6 C  SpO2: 95% 92%    Last Pain:  Vitals:   10/11/20 1100  TempSrc:   PainSc: 3                  Charles Foley

## 2020-10-11 NOTE — Anesthesia Procedure Notes (Signed)
Procedure Name: Intubation Date/Time: 10/11/2020 7:42 AM Performed by: Lance Coon, CRNA Pre-anesthesia Checklist: Patient identified, Emergency Drugs available, Suction available, Patient being monitored and Timeout performed Patient Re-evaluated:Patient Re-evaluated prior to induction Oxygen Delivery Method: Circle system utilized Preoxygenation: Pre-oxygenation with 100% oxygen Induction Type: IV induction Ventilation: Mask ventilation without difficulty Laryngoscope Size: Miller and 3 Grade View: Grade II Tube type: Oral Tube size: 7.5 mm Number of attempts: 1 Airway Equipment and Method: Stylet Placement Confirmation: ETT inserted through vocal cords under direct vision,  positive ETCO2 and breath sounds checked- equal and bilateral Secured at: 22 cm Tube secured with: Tape Dental Injury: Teeth and Oropharynx as per pre-operative assessment

## 2020-10-11 NOTE — H&P (Signed)
PREOPERATIVE H&P  Chief Complaint: Bilateral leg pain   HPI: Charles Foley is a 60 y.o. male who presents with ongoing pain in the bilateral legs  MRI reveals severe spinal stenosis at L4/5  Patient has failed multiple forms of conservative care and continues to have pain (see office notes for additional details regarding the patient's full course of treatment)  Past Medical History:  Diagnosis Date  . Acute pain of right knee 06/17/2017  . Allergic rhinitis   . Anxiety   . Arthritis   . Cancer [facility]) 2012   prostate  . Chest pain 11/02/2014   atypical, no cardiac testing ordered per Dorris Carnes, MD 11/03/14  . Diabetes mellitus without complication ([place])    type II  . Hypertension   . Hypothyroidism    Past Surgical History:  Procedure Laterality Date  . COLONOSCOPY    . KNEE ARTHROSCOPY Bilateral   . ORIF FACIAL FRACTURE  1988  . PROSTATECTOMY  2012   Social History   Socioeconomic History  . Marital status: Married    Spouse name: Not on file  . Number of children: Not on file  . Years of education: Not on file  . Highest education level: Not on file  Occupational History  . Not on file  Tobacco Use  . Smoking status: Former Smoker    Quit date: 04/10/2009    Years since quitting: 11.5  . Smokeless tobacco: Former Systems developer    Quit date: 01/10/2003  . Tobacco comment: smoke off and on  Vaping Use  . Vaping Use: Never used  Substance and Sexual Activity  . Alcohol use: Not Currently  . Drug use: No  . Sexual activity: Not on file  Other Topics Concern  . Not on file  Social History Narrative  . Not on file   Social Determinants of Health   Financial Resource Strain: Not on file  Food Insecurity: Not on file  Transportation Needs: Not on file  Physical Activity: Not on file  Stress: Not on file  Social Connections: Not on file   Family History  Problem Relation Age of Onset  . Diabetes Mother   . Heart disease Mother   . Colon cancer Neg Hx     No Known Allergies Prior to Admission medications   Medication Sig Start Date End Date Taking? Authorizing Provider  acetaminophen (TYLENOL) 325 MG tablet Take 650 mg by mouth every 6 (six) hours as needed for pain.   Yes [provider]  ALPRAZolam Duanne Moron) 1 MG tablet Take 1 tablet (1 mg total) by mouth at bedtime as needed. for sleep 08/06/20  Yes Stacks, Cletus Gash, MD  atorvastatin (LIPITOR) 10 MG tablet Take 1 tablet (10 mg total) by mouth daily. 08/06/20  Yes Claretta Fraise, MD  Biotin 1000 MCG tablet Take 1,000 mcg by mouth daily.    Yes [provider]  busPIRone (BUSPAR) 10 MG tablet Take 1 tablet (10 mg total) by mouth 3 (three) times daily. 08/06/20  Yes Claretta Fraise, MD  cetirizine (ZYRTEC) 10 MG tablet Take 1 tablet (10 mg total) by mouth daily. 01/31/20  Yes Stacks, Cletus Gash, MD  clobetasol (TEMOVATE) 0.05 % external solution Apply 1 application topically 2 (two) times daily. 01/31/20  Yes Claretta Fraise, MD  cyclobenzaprine (FLEXERIL) 10 MG tablet Take 1 tablet (10 mg total) by mouth 3 (three) times daily as needed for muscle spasms. 08/06/20  Yes Stacks, Cletus Gash, MD  ibuprofen (ADVIL,MOTRIN) 200 MG tablet Take 200  mg by mouth every 6 (six) hours as needed for pain.   Yes [provider]  insulin glargine, 2 Unit Dial, (TOUJEO MAX SOLOSTAR) 300 UNIT/ML Solostar Pen Inject 40 Units into the skin in the morning and at bedtime. 08/06/20  Yes Stacks, Cletus Gash, MD  insulin lispro (HUMALOG KWIKPEN) 100 UNIT/ML KwikPen INJECT UP TO 20 UNITS TIS BEFORE MEALS 08/06/20  Yes Stacks, Cletus Gash, MD  levothyroxine (SYNTHROID) 25 MCG tablet Take 1 tablet (25 mcg total) by mouth daily. 08/06/20  Yes Stacks, Cletus Gash, MD  lisinopril (ZESTRIL) 20 MG tablet Take 1 tablet (20 mg total) by mouth daily. 08/06/20  Yes StacksCletus Gash, MD  Multiple Vitamin (MULTIVITAMIN) tablet Take 1 tablet by mouth daily.   Yes [provider]  nabumetone (RELAFEN) 500 MG tablet Take 2 tablets  (1,000 mg total) by mouth 2 (two) times daily. For muscle and joint pain 07/18/19  Yes Stacks, Cletus Gash, MD  pantoprazole (PROTONIX) 40 MG tablet Take 1 tablet (40 mg total) by mouth daily. 01/31/20  Yes Claretta Fraise, MD  Continuous Blood Gluc Sensor (FREESTYLE LIBRE 2 SENSOR) MISC by Does not apply route.     [provider]  glucose blood (ONETOUCH VERIO) test strip Check Blood Sugars 1 to 2 times daily. 02/17/20   Claretta Fraise, MD     All other systems have been reviewed and were otherwise negative with the exception of those mentioned in the HPI and as above.  Physical Exam: Vitals:   10/11/20 0559  BP: (!) 147/71  Pulse: 66  Resp: 18  Temp: 98 F (36.7 C)  SpO2: (!) 69%    Body mass index is 29.09 kg/m.  General: Alert, no acute distress Cardiovascular: No pedal edema Respiratory: No cyanosis, no use of accessory musculature Skin: No lesions in the area of chief complaint Neurologic: Sensation intact distally Psychiatric: Patient is competent for consent with normal mood and affect Lymphatic: No axillary or cervical lymphadenopathy   Assessment/Plan: SEVERE SPINAL STENOSIS AT LUMBAR 4-LUMBAR 5 Plan for Procedure(s): LUMBAR 4 -LUMBAR 5 DECOMPRESSION   Norva Karvonen, MD 10/11/2020 6:35 AM

## 2020-10-12 ENCOUNTER — Encounter (HOSPITAL_COMMUNITY): Payer: Self-pay | Admitting: Orthopedic Surgery

## 2020-10-12 LAB — GLUCOSE, CAPILLARY: Glucose-Capillary: 247 mg/dL — ABNORMAL HIGH (ref 70–99)

## 2020-11-06 ENCOUNTER — Other Ambulatory Visit: Payer: Self-pay | Admitting: Family Medicine

## 2020-11-06 DIAGNOSIS — E109 Type 1 diabetes mellitus without complications: Secondary | ICD-10-CM

## 2020-11-07 ENCOUNTER — Other Ambulatory Visit: Payer: Self-pay

## 2020-11-07 ENCOUNTER — Encounter: Payer: Self-pay | Admitting: Family Medicine

## 2020-11-07 ENCOUNTER — Ambulatory Visit: Payer: 59 | Admitting: Family Medicine

## 2020-11-07 VITALS — BP 161/75 | HR 68 | Temp 98.5°F | Resp 20 | Ht 69.0 in | Wt 201.4 lb

## 2020-11-07 DIAGNOSIS — E039 Hypothyroidism, unspecified: Secondary | ICD-10-CM | POA: Diagnosis not present

## 2020-11-07 DIAGNOSIS — I10 Essential (primary) hypertension: Secondary | ICD-10-CM | POA: Diagnosis not present

## 2020-11-07 DIAGNOSIS — E109 Type 1 diabetes mellitus without complications: Secondary | ICD-10-CM | POA: Diagnosis not present

## 2020-11-07 DIAGNOSIS — Z23 Encounter for immunization: Secondary | ICD-10-CM

## 2020-11-07 LAB — BAYER DCA HB A1C WAIVED: HB A1C (BAYER DCA - WAIVED): 8.1 % — ABNORMAL HIGH (ref <7.0)

## 2020-11-07 MED ORDER — TOUJEO MAX SOLOSTAR 300 UNIT/ML ~~LOC~~ SOPN: 40.0000 [IU] | PEN_INJECTOR | Freq: Two times a day (BID) | SUBCUTANEOUS | 11 refills | Status: DC

## 2020-11-07 MED ORDER — INSULIN LISPRO (1 UNIT DIAL) 100 UNIT/ML (KWIKPEN): PEN_INJECTOR | SUBCUTANEOUS | 0 refills | Status: DC

## 2020-11-07 NOTE — Progress Notes (Signed)
Hello Jong,  Your lab result is normal and/or stable.Some minor variations that are not significant are commonly marked abnormal, but do not represent any medical problem for you.  Best regards, Markea Ruzich, M.D.

## 2020-11-07 NOTE — Progress Notes (Signed)
Subjective:  Patient ID: Charles Foley, male    DOB: Sep 10, 1961  Age: 60 y.o. MRN: [id number]  CC: Medical Management of Chronic Issues   HPI Charles Foley presents forFollow-up of diabetes. Patient checks blood sugar at home.   106 fasting and 180-200 postprandial. Doesn't seem to go along with what he eats. Spikes to 200+ Patient denies symptoms such as polyuria, polydipsia, excessive hunger, nausea No significant hypoglycemic spells noted. Medications reviewed. Pt reports taking them regularly without complication/adverse reaction being reported today.  Considering an insulin pump with DM specialist next month.  History Charles Foley has a past medical history of Acute pain of right knee (06/17/2017), Allergic rhinitis, Anxiety, Arthritis, Cancer (Charles Foley) (2012), Chest pain (11/02/2014), Diabetes mellitus without complication (Charles Foley), Hypertension, and Hypothyroidism.   He has a past surgical history that includes Prostatectomy (2012); ORIF facial fracture (1988); Knee arthroscopy (Bilateral); Colonoscopy; and Lumbar laminectomy/decompression microdiscectomy (N/A, 10/11/2020).   His family history includes Diabetes in his mother; Heart disease in his mother.He reports that he quit smoking about 11 years ago. He quit smokeless tobacco use about 17 years ago. He reports previous alcohol use. He reports that he does not use drugs.  Current Outpatient Medications on File Prior to Visit  Medication Sig Dispense Refill  . acetaminophen (TYLENOL) 325 MG tablet Take 650 mg by mouth every 6 (six) hours as needed for pain.    Marland Kitchen ALPRAZolam (XANAX) 1 MG tablet Take 1 tablet (1 mg total) by mouth at bedtime as needed. for sleep 30 tablet 5  . atorvastatin (LIPITOR) 10 MG tablet Take 1 tablet (10 mg total) by mouth daily. 90 tablet 1  . busPIRone (BUSPAR) 10 MG tablet Take 1 tablet (10 mg total) by mouth 3 (three) times daily. 90 tablet 5  . cetirizine (ZYRTEC) 10 MG tablet Take 1 tablet (10 mg total) by mouth  daily. 90 tablet 3  . clobetasol (TEMOVATE) 0.05 % external solution Apply 1 application topically 2 (two) times daily. 50 mL 0  . levothyroxine (SYNTHROID) 25 MCG tablet Take 1 tablet (25 mcg total) by mouth daily. 90 tablet 1  . lisinopril (ZESTRIL) 20 MG tablet Take 1 tablet (20 mg total) by mouth daily. 90 tablet 1  . Multiple Vitamin (MULTIVITAMIN) tablet Take 1 tablet by mouth daily.    . pantoprazole (PROTONIX) 40 MG tablet Take 1 tablet (40 mg total) by mouth daily. 90 tablet 3   No current facility-administered medications on file prior to visit.    ROS Review of Systems  Constitutional: Negative for fever.  Respiratory: Negative for shortness of breath.   Cardiovascular: Negative for chest pain.  Musculoskeletal: Negative for arthralgias.  Skin: Negative for rash.    Objective:  BP (!) 161/75   Pulse 68   Temp 98.5 F (36.9 C) (Temporal)   Resp 20   Ht 5' 9"  (1.753 m)   Wt 201 lb 6 oz (91.3 kg)   SpO2 99%   BMI 29.74 kg/m   BP Readings from Last 3 Encounters:  11/07/20 (!) 161/75  10/11/20 (!) 166/72  10/09/20 (!) 166/71    Wt Readings from Last 3 Encounters:  11/07/20 201 lb 6 oz (91.3 kg)  10/11/20 197 lb (89.4 kg)  10/09/20 198 lb 11.2 oz (90.1 kg)     Physical Exam Vitals reviewed.  Constitutional:      Appearance: He is well-developed and well-nourished.  HENT:     Head: Normocephalic and atraumatic.     Right Ear: Tympanic membrane  and external ear normal. No decreased hearing noted.     Left Ear: Tympanic membrane and external ear normal. No decreased hearing noted.     Mouth/Throat:     Pharynx: No oropharyngeal exudate or posterior oropharyngeal erythema.  Eyes:     Pupils: Pupils are equal, round, and reactive to light.  Cardiovascular:     Rate and Rhythm: Normal rate and regular rhythm.     Heart sounds: No murmur heard.   Pulmonary:     Effort: No respiratory distress.     Breath sounds: Normal breath sounds.  Abdominal:      General: Bowel sounds are normal.     Palpations: Abdomen is soft. There is no mass.     Tenderness: There is no abdominal tenderness.  Musculoskeletal:     Cervical back: Normal range of motion and neck supple.       Assessment & Plan:   Chivas was seen today for medical management of chronic issues.  Diagnoses and all orders for this visit:  Type 1 diabetes mellitus without complication (HCC) -     Microalbumin / creatinine urine ratio -     Bayer DCA Hb A1c Waived -     CBC with Differential/Platelet -     CMP14+EGFR -     Lipid panel -     insulin lispro (HUMALOG KWIKPEN) 100 UNIT/ML KwikPen; INJECT UP TO 20 UNITS TIS BEFORE MEALS  Hypothyroidism, unspecified type -     CBC with Differential/Platelet -     CMP14+EGFR -     Lipid panel -     Thyroid Panel With TSH  Essential hypertension -     CBC with Differential/Platelet -     CMP14+EGFR -     Lipid panel  Need for immunization against influenza -     Flu Vaccine QUAD 36+ mos IM  Other orders -     insulin glargine, 2 Unit Dial, (TOUJEO MAX SOLOSTAR) 300 UNIT/ML Solostar Pen; Inject 40 Units into the skin in the morning and at bedtime.      I have discontinued Virgia Land "[name]"'s Biotin, FreeStyle Libre 2 Sensor, OneTouch Verio, cyclobenzaprine, and methocarbamol. I am also having him maintain his acetaminophen, multivitamin, cetirizine, clobetasol, pantoprazole, lisinopril, levothyroxine, busPIRone, atorvastatin, ALPRAZolam, insulin lispro, and Toujeo Max SoloStar.  Meds ordered this encounter  Medications  . insulin lispro (HUMALOG KWIKPEN) 100 UNIT/ML KwikPen    Sig: INJECT UP TO 20 UNITS TIS BEFORE MEALS    Dispense:  15 mL    Refill:  0  . insulin glargine, 2 Unit Dial, (TOUJEO MAX SOLOSTAR) 300 UNIT/ML Solostar Pen    Sig: Inject 40 Units into the skin in the morning and at bedtime.    Dispense:  9 mL    Refill:  11     Follow-up: Return in about 6 months (around 05/07/2021).  Claretta Fraise,  M.D.

## 2020-11-08 LAB — LIPID PANEL
Chol/HDL Ratio: 2.8 ratio (ref 0.0–5.0)
Cholesterol, Total: 170 mg/dL (ref 100–199)
HDL: 61 mg/dL (ref >39)
LDL Chol Calc (NIH): 95 mg/dL (ref 0–99)
Triglycerides: 72 mg/dL (ref 0–149)
VLDL Cholesterol Cal: 14 mg/dL (ref 5–40)

## 2020-11-08 LAB — CBC WITH DIFFERENTIAL/PLATELET
Basophils Absolute: 0.1 10*3/uL (ref 0.0–0.2)
Basos: 1 %
EOS (ABSOLUTE): 0.6 10*3/uL — ABNORMAL HIGH (ref 0.0–0.4)
Eos: 7 %
Hematocrit: 44.4 % (ref 37.5–51.0)
Hemoglobin: 15.4 g/dL (ref 13.0–17.7)
Immature Grans (Abs): 0 10*3/uL (ref 0.0–0.1)
Immature Granulocytes: 0 %
Lymphocytes Absolute: 3.5 10*3/uL — ABNORMAL HIGH (ref 0.7–3.1)
Lymphs: 45 %
MCH: 31.7 pg (ref 26.6–33.0)
MCHC: 34.7 g/dL (ref 31.5–35.7)
MCV: 91 fL (ref 79–97)
Monocytes Absolute: 0.6 10*3/uL (ref 0.1–0.9)
Monocytes: 8 %
Neutrophils Absolute: 3 10*3/uL (ref 1.4–7.0)
Neutrophils: 39 %
Platelets: 299 10*3/uL (ref 150–450)
RBC: 4.86 x10E6/uL (ref 4.14–5.80)
RDW: 12.6 % (ref 11.6–15.4)
WBC: 7.7 10*3/uL (ref 3.4–10.8)

## 2020-11-08 LAB — THYROID PANEL WITH TSH
Free Thyroxine Index: 2.8 (ref 1.2–4.9)
T3 Uptake Ratio: 30 % (ref 24–39)
T4, Total: 9.4 ug/dL (ref 4.5–12.0)
TSH: 2.68 u[IU]/mL (ref 0.450–4.500)

## 2020-11-08 LAB — CMP14+EGFR
ALT: 29 IU/L (ref 0–44)
AST: 20 IU/L (ref 0–40)
Albumin/Globulin Ratio: 1.7 (ref 1.2–2.2)
Albumin: 4.8 g/dL (ref 3.8–4.8)
Alkaline Phosphatase: 67 IU/L (ref 44–121)
BUN/Creatinine Ratio: 12 (ref 10–24)
BUN: 11 mg/dL (ref 8–27)
Bilirubin Total: 0.7 mg/dL (ref 0.0–1.2)
CO2: 22 mmol/L (ref 20–29)
Calcium: 10.3 mg/dL — ABNORMAL HIGH (ref 8.6–10.2)
Chloride: 100 mmol/L (ref 96–106)
Creatinine, Ser: 0.93 mg/dL (ref 0.76–1.27)
GFR calc Af Amer: 101 mL/min/{1.73_m2} (ref >59)
GFR calc non Af Amer: 88 mL/min/{1.73_m2} (ref >59)
Globulin, Total: 2.9 g/dL (ref 1.5–4.5)
Glucose: 92 mg/dL (ref 65–99)
Potassium: 4.8 mmol/L (ref 3.5–5.2)
Sodium: 139 mmol/L (ref 134–144)
Total Protein: 7.7 g/dL (ref 6.0–8.5)

## 2020-11-08 LAB — MICROALBUMIN / CREATININE URINE RATIO
Creatinine, Urine: 12.7 mg/dL
Microalb/Creat Ratio: <24 mg/g creat (ref 0–29)
Microalbumin, Urine: <3 ug/mL

## 2020-11-11 NOTE — Progress Notes (Signed)
Hello Zakk,  Your lab result is normal and/or stable.Some minor variations that are not significant are commonly marked abnormal, but do not represent any medical problem for you.  Best regards, Salmaan Patchin, M.D.

## 2020-12-03 ENCOUNTER — Telehealth: Payer: Self-pay

## 2020-12-03 DIAGNOSIS — E109 Type 1 diabetes mellitus without complications: Secondary | ICD-10-CM

## 2020-12-03 MED ORDER — INSULIN LISPRO (1 UNIT DIAL) 100 UNIT/ML (KWIKPEN): PEN_INJECTOR | SUBCUTANEOUS | 0 refills | Status: DC

## 2020-12-03 NOTE — Telephone Encounter (Signed)
  Prescription Request  12/03/2020  What is the name of the medication or equipment? insulin lispro (HUMALOG KWIKPEN) 100 UNIT/ML KwikPen  Have you contacted your pharmacy to request a refill? (if applicable) yes  Which pharmacy would you like this sent to? [facility]    Patient notified that their request is being sent to the clinical staff for review and that they should receive a response within 2 business days.

## 2020-12-04 ENCOUNTER — Telehealth: Payer: Self-pay | Admitting: *Deleted

## 2020-12-04 ENCOUNTER — Other Ambulatory Visit: Payer: Self-pay | Admitting: Family Medicine

## 2020-12-04 DIAGNOSIS — E109 Type 1 diabetes mellitus without complications: Secondary | ICD-10-CM

## 2020-12-04 MED ORDER — HUMALOG KWIKPEN 100 UNIT/ML ~~LOC~~ SOPN: PEN_INJECTOR | SUBCUTANEOUS | 0 refills | Status: DC

## 2020-12-04 NOTE — Telephone Encounter (Signed)
I sent the scrip in for Humalog KwikPen since It was on the list.

## 2020-12-04 NOTE — Telephone Encounter (Signed)
Insulin Lispro (1 Unit Dial) 100UNIT/ML pen Rejection notice came in from plan.  Preferred switch options:   HumaLOG Not Required HumaLOG Not Required HumaLOG Junior KwikPen Not Required HumaLOG KwikPen Not Required HumaLOG Mix 50/50 KwikPen Not Required HumaLOG Mix 75/25 KwikPen Not Required Lyumjev KwikPen Not Required  If you choose a change, send to [facility].  If you prefer we cant attempt a PA

## 2020-12-05 NOTE — Telephone Encounter (Signed)
New med sent to pharmacy

## 2020-12-06 ENCOUNTER — Other Ambulatory Visit: Payer: Self-pay | Admitting: Family Medicine

## 2020-12-06 DIAGNOSIS — E109 Type 1 diabetes mellitus without complications: Secondary | ICD-10-CM

## 2021-01-16 ENCOUNTER — Encounter: Payer: Self-pay | Admitting: Family Medicine

## 2021-01-16 ENCOUNTER — Ambulatory Visit (INDEPENDENT_AMBULATORY_CARE_PROVIDER_SITE_OTHER): Payer: 59 | Admitting: Family Medicine

## 2021-01-16 DIAGNOSIS — R21 Rash and other nonspecific skin eruption: Secondary | ICD-10-CM

## 2021-01-16 MED ORDER — TRIAMCINOLONE ACETONIDE 0.1 % EX CREA: 1.0000 "application " | TOPICAL_CREAM | Freq: Two times a day (BID) | CUTANEOUS | 2 refills | Status: DC

## 2021-01-16 NOTE — Progress Notes (Signed)
Virtual Visit via Telephone Note  I connected with Charles Foley on 01/16/21 at 1:27 PM by telephone and verified that I am speaking with the correct person using two identifiers. Charles Foley is currently located at home and nobody is currently with him during this visit. The provider, Loman Brooklyn, FNP is located in their office at time of visit.  I discussed the limitations, risks, security and privacy concerns of performing an evaluation and management service by telephone and the availability of in person appointments. I also discussed with the patient that there may be a patient responsible charge related to this service. The patient expressed understanding and agreed to proceed.  Subjective: PCP: Claretta Fraise, MD  Chief Complaint  Patient presents with  . Urticaria   Patient reports a itchy rash that started Monday after being outside all day on Sunday.  He describes it as flat red patches on his arms, legs, chest, and back.  He has been applying cortisone cream which is helpful, but does not last very long.  Denies being bitten by anything.  He does take Zyrtec daily.  States this happened many years ago at which time he saw a skin specialist and was given a cream, which was helpful.   ROS: Per HPI  Current Outpatient Medications:  .  acetaminophen (TYLENOL) 325 MG tablet, Take 650 mg by mouth every 6 (six) hours as needed for pain., Disp: , Rfl:  .  ALPRAZolam (XANAX) 1 MG tablet, Take 1 tablet (1 mg total) by mouth at bedtime as needed. for sleep, Disp: 30 tablet, Rfl: 5 .  atorvastatin (LIPITOR) 10 MG tablet, Take 1 tablet (10 mg total) by mouth daily., Disp: 90 tablet, Rfl: 1 .  busPIRone (BUSPAR) 10 MG tablet, Take 1 tablet (10 mg total) by mouth 3 (three) times daily., Disp: 90 tablet, Rfl: 5 .  cetirizine (ZYRTEC) 10 MG tablet, Take 1 tablet (10 mg total) by mouth daily., Disp: 90 tablet, Rfl: 3 .  clobetasol (TEMOVATE) 0.05 % external solution, Apply 1 application  topically 2 (two) times daily., Disp: 50 mL, Rfl: 0 .  insulin glargine, 2 Unit Dial, (TOUJEO MAX SOLOSTAR) 300 UNIT/ML Solostar Pen, Inject 40 Units into the skin in the morning and at bedtime., Disp: 9 mL, Rfl: 11 .  insulin lispro (HUMALOG) 100 UNIT/ML KwikPen, INJECT UP TO 20 UNITS 3 TIMES A DAY BEFORE MEALS, Disp: 15 mL, Rfl: 0 .  levothyroxine (SYNTHROID) 25 MCG tablet, Take 1 tablet (25 mcg total) by mouth daily., Disp: 90 tablet, Rfl: 1 .  lisinopril (ZESTRIL) 20 MG tablet, Take 1 tablet (20 mg total) by mouth daily., Disp: 90 tablet, Rfl: 1 .  Multiple Vitamin (MULTIVITAMIN) tablet, Take 1 tablet by mouth daily., Disp: , Rfl:  .  pantoprazole (PROTONIX) 40 MG tablet, Take 1 tablet (40 mg total) by mouth daily., Disp: 90 tablet, Rfl: 3  No Known Allergies Past Medical History:  Diagnosis Date  . Acute pain of right knee 06/17/2017  . Allergic rhinitis   . Anxiety   . Arthritis   . Cancer [facility]) 2012   prostate  . Chest pain 11/02/2014   atypical, no cardiac testing ordered per Dorris Carnes, MD 11/03/14  . Diabetes mellitus without complication ([place])    type II  . Hypertension   . Hypothyroidism     Observations/Objective: A&O  No respiratory distress or wheezing audible over the phone Mood, judgement, and thought processes all WNL   Assessment and Plan: 1.  Rash Rx'd triamcinolone cream.  Advised patient to follow-up if rash does not improve.  Continue Zyrtec daily. - triamcinolone cream (KENALOG) 0.1 %; Apply 1 application topically 2 (two) times daily.  Dispense: 80 g; Refill: 2   Follow Up Instructions:  I discussed the assessment and treatment plan with the patient. The patient was provided an opportunity to ask questions and all were answered. The patient agreed with the plan and demonstrated an understanding of the instructions.   The patient was advised to call back or seek an in-person evaluation if the symptoms worsen or if the condition fails to improve as  anticipated.  The above assessment and management plan was discussed with the patient. The patient verbalized understanding of and has agreed to the management plan. Patient is aware to call the clinic if symptoms persist or worsen. Patient is aware when to return to the clinic for a follow-up visit. Patient educated on when it is appropriate to go to the emergency department.   Time call ended: 1:38 PM  I provided 11 minutes of non-face-to-face time during this encounter.  Hendricks Limes, MSN, APRN, FNP-C [facility] 01/16/21

## 2021-01-22 ENCOUNTER — Other Ambulatory Visit: Payer: Self-pay | Admitting: Family Medicine

## 2021-01-22 DIAGNOSIS — R21 Rash and other nonspecific skin eruption: Secondary | ICD-10-CM

## 2021-01-22 NOTE — Telephone Encounter (Signed)
Pharmacy is asking if more than 80 grams can be prescribed because patient uses on whole body.

## 2021-01-28 ENCOUNTER — Ambulatory Visit (INDEPENDENT_AMBULATORY_CARE_PROVIDER_SITE_OTHER): Payer: 59 | Admitting: Nurse Practitioner

## 2021-01-28 ENCOUNTER — Encounter: Payer: Self-pay | Admitting: Nurse Practitioner

## 2021-01-28 DIAGNOSIS — J0100 Acute maxillary sinusitis, unspecified: Secondary | ICD-10-CM | POA: Diagnosis not present

## 2021-01-28 MED ORDER — DM-GUAIFENESIN ER 30-600 MG PO TB12: 1.0000 | ORAL_TABLET | Freq: Two times a day (BID) | ORAL | 0 refills | Status: DC

## 2021-01-28 MED ORDER — AMOXICILLIN-POT CLAVULANATE 875-125 MG PO TABS: 1.0000 | ORAL_TABLET | Freq: Two times a day (BID) | ORAL | 0 refills | Status: DC

## 2021-01-28 MED ORDER — PREDNISONE 10 MG (21) PO TBPK
ORAL_TABLET | ORAL | 0 refills | Status: DC
Start: 2021-01-28 — End: 2021-02-12

## 2021-01-28 NOTE — Progress Notes (Signed)
   Virtual Visit  Note Due to COVID-19 pandemic this visit was conducted virtually. This visit type was conducted due to national recommendations for restrictions regarding the COVID-19 Pandemic (e.g. social distancing, sheltering in place) in an effort to limit this patient's exposure and mitigate transmission in our community. All issues noted in this document were discussed and addressed.  A physical exam was not performed with this format.  I connected with Charles Foley on 01/28/21 at  9:20 AM by telephone and verified that I am speaking with the correct person using two identifiers. Charles Foley is currently located at home during visit. The provider, Ivy Lynn, NP is located in their office at time of visit.  I discussed the limitations, risks, security and privacy concerns of performing an evaluation and management service by telephone and the availability of in person appointments. I also discussed with the patient that there may be a patient responsible charge related to this service. The patient expressed understanding and agreed to proceed.   History and Present Illness:  Sinusitis This is a recurrent problem. The current episode started in the past 7 days. The problem has been gradually worsening since onset. There has been no fever. The pain is moderate. Associated symptoms include congestion, coughing, headaches, sinus pressure and a sore throat. Pertinent negatives include no ear pain. (Myalgia) Past treatments include nothing.      Review of Systems  HENT: Positive for congestion, sinus pressure and sore throat. Negative for ear pain.   Respiratory: Positive for cough.   Skin: Negative for rash.  Neurological: Positive for headaches.  All other systems reviewed and are negative.    Observations/Objective: Televisit patient did not sound to be in distress.  Assessment and Plan: Acute non-recurrent maxillary sinusitis Worsening symptoms of acute nonrecurrent maxillary  sinusitis.  Symptoms of increased congestion, head pressure, cough and sore throat.  Patient is reporting myalgia but no fever or earache.  Patient reports at home COVID test negative and will not repeat test today. Encourage patient to increase hydration Coolmist humidifier Saline nasal spray Ibuprofen/Tylenol for headache or fever. Guaifenesin for cough and congestion. Prednisone taper Augmentin 875-125 mg tablet by mouth daily Take medication as prescribed      Follow Up Instructions: Follow-up with worsening unresolved symptoms.   I discussed the assessment and treatment plan with the patient. The patient was provided an opportunity to ask questions and all were answered. The patient agreed with the plan and demonstrated an understanding of the instructions.   The patient was advised to call back or seek an in-person evaluation if the symptoms worsen or if the condition fails to improve as anticipated.  The above assessment and management plan was discussed with the patient. The patient verbalized understanding of and has agreed to the management plan. Patient is aware to call the clinic if symptoms persist or worsen. Patient is aware when to return to the clinic for a follow-up visit. Patient educated on when it is appropriate to go to the emergency department.   Time call ended: 9:28 AM  I provided  minutes of  non face-to-face time during this encounter.    Ivy Lynn, NP

## 2021-01-28 NOTE — Assessment & Plan Note (Signed)
Worsening symptoms of acute nonrecurrent maxillary sinusitis.  Symptoms of increased congestion, head pressure, cough and sore throat.  Patient is reporting myalgia but no fever or earache.  Patient reports at home COVID test negative and will not repeat test today. Encourage patient to increase hydration Coolmist humidifier Saline nasal spray Ibuprofen/Tylenol for headache or fever. Guaifenesin for cough and congestion. Prednisone taper Augmentin 875-125 mg tablet by mouth daily Take medication as prescribed

## 2021-02-04 ENCOUNTER — Ambulatory Visit: Payer: 59 | Admitting: Family Medicine

## 2021-02-04 ENCOUNTER — Encounter: Payer: Self-pay | Admitting: Family Medicine

## 2021-02-12 ENCOUNTER — Encounter: Payer: Self-pay | Admitting: Family Medicine

## 2021-02-12 ENCOUNTER — Ambulatory Visit: Payer: 59 | Admitting: Family Medicine

## 2021-02-12 ENCOUNTER — Other Ambulatory Visit: Payer: Self-pay

## 2021-02-12 VITALS — BP 146/68 | HR 61 | Temp 98.1°F | Ht 69.0 in | Wt 199.2 lb

## 2021-02-12 DIAGNOSIS — E109 Type 1 diabetes mellitus without complications: Secondary | ICD-10-CM | POA: Diagnosis not present

## 2021-02-12 DIAGNOSIS — Z79899 Other long term (current) drug therapy: Secondary | ICD-10-CM | POA: Diagnosis not present

## 2021-02-12 DIAGNOSIS — F411 Generalized anxiety disorder: Secondary | ICD-10-CM | POA: Diagnosis not present

## 2021-02-12 DIAGNOSIS — E039 Hypothyroidism, unspecified: Secondary | ICD-10-CM | POA: Diagnosis not present

## 2021-02-12 DIAGNOSIS — E782 Mixed hyperlipidemia: Secondary | ICD-10-CM

## 2021-02-12 DIAGNOSIS — E559 Vitamin D deficiency, unspecified: Secondary | ICD-10-CM

## 2021-02-12 DIAGNOSIS — I10 Essential (primary) hypertension: Secondary | ICD-10-CM

## 2021-02-12 LAB — URINALYSIS
Bilirubin, UA: NEGATIVE
Leukocytes,UA: NEGATIVE
Nitrite, UA: NEGATIVE
Protein,UA: NEGATIVE
RBC, UA: NEGATIVE
Specific Gravity, UA: 1.025 (ref 1.005–1.030)
Urobilinogen, Ur: 1 mg/dL (ref 0.2–1.0)
pH, UA: 6.5 (ref 5.0–7.5)

## 2021-02-12 MED ORDER — TRAZODONE HCL 150 MG PO TABS
ORAL_TABLET | ORAL | 5 refills | Status: DC
Start: 2021-02-12 — End: 2021-08-19

## 2021-02-12 MED ORDER — LISINOPRIL-HYDROCHLOROTHIAZIDE 20-12.5 MG PO TABS: 1.0000 | ORAL_TABLET | Freq: Every day | ORAL | 3 refills | Status: DC

## 2021-02-12 MED ORDER — ALPRAZOLAM 0.5 MG PO TABS: 0.7500 mg | ORAL_TABLET | Freq: Every evening | ORAL | 5 refills | Status: DC | PRN

## 2021-02-12 NOTE — Progress Notes (Signed)
Subjective:  Patient ID: Charles Foley,  male    DOB: 09-16-1961  Age: 60 y.o.    CC: Medical Management of Chronic Issues   HPI Charles Foley presents for  follow-up of hypertension. Patient has no history of headache chest pain or shortness of breath or recent cough. Patient also denies symptoms of TIA such as numbness weakness lateralizing. Patient denies side effects from medication. States taking it regularly.  Patient also  in for follow-up of elevated cholesterol. Doing well without complaints on current medication. Denies side effects  including myalgia and arthralgia and nausea. Also in today for liver function testing. Currently no chest pain, shortness of breath or other cardiovascular related symptoms noted.  Taking xanax for sleep for 4-5 years   follow-up on  thyroid. The patient has a history of hypothyroidism for many years. It has been stable recently. Pt. denies any change in  voice, loss of hair, heat or cold intolerance. Energy level has been adequate to good. Patient denies constipation and diarrhea. No myxedema. Medication is as noted below. Verified that pt is taking it daily on an empty stomach. Well tolerated.   History Charles Foley has a past medical history of Acute pain of right knee (06/17/2017), Allergic rhinitis, Anxiety, Arthritis, Cancer ([place]) (2012), Chest pain (11/02/2014), Diabetes mellitus without complication ([place]), Hypertension, and Hypothyroidism.   He has a past surgical history that includes Prostatectomy (2012); ORIF facial fracture (1988); Knee arthroscopy (Bilateral); Colonoscopy; and Lumbar laminectomy/decompression microdiscectomy (N/A, 10/11/2020).   His family history includes Diabetes in his mother; Heart disease in his mother.He reports that he quit smoking about 11 years ago. He quit smokeless tobacco use about 18 years ago. He reports previous alcohol use. He reports that he does not use drugs.  Current Outpatient Medications on File Prior to Visit   Medication Sig Dispense Refill  . acetaminophen (TYLENOL) 325 MG tablet Take 650 mg by mouth every 6 (six) hours as needed for pain.    Marland Kitchen atorvastatin (LIPITOR) 10 MG tablet Take 1 tablet (10 mg total) by mouth daily. 90 tablet 1  . busPIRone (BUSPAR) 10 MG tablet Take 1 tablet (10 mg total) by mouth 3 (three) times daily. 90 tablet 5  . cetirizine (ZYRTEC) 10 MG tablet Take 1 tablet (10 mg total) by mouth daily. 90 tablet 3  . clobetasol (TEMOVATE) 0.05 % external solution Apply 1 application topically 2 (two) times daily. 50 mL 0  . insulin glargine, 2 Unit Dial, (TOUJEO MAX SOLOSTAR) 300 UNIT/ML Solostar Pen Inject 40 Units into the skin in the morning and at bedtime. 9 mL 11  . insulin lispro (HUMALOG) 100 UNIT/ML KwikPen INJECT UP TO 20 UNITS 3 TIMES A DAY BEFORE MEALS 15 mL 0  . levothyroxine (SYNTHROID) 25 MCG tablet Take 1 tablet (25 mcg total) by mouth daily. 90 tablet 1  . Multiple Vitamin (MULTIVITAMIN) tablet Take 1 tablet by mouth daily.    . pantoprazole (PROTONIX) 40 MG tablet Take 1 tablet (40 mg total) by mouth daily. 90 tablet 3  . triamcinolone cream (KENALOG) 0.1 % APPLY 1 APPLICATION TOPICALLY 2 TIMES DAILY 160 g 2   No current facility-administered medications on file prior to visit.    ROS Review of Systems  Constitutional: Negative.   HENT: Negative.   Eyes: Negative for visual disturbance.  Respiratory: Negative for cough and shortness of breath.   Cardiovascular: Negative for chest pain and leg swelling.  Gastrointestinal: Negative for abdominal pain, diarrhea, nausea and vomiting.  Genitourinary: Negative for difficulty urinating.  Musculoskeletal: Negative for arthralgias and myalgias.  Skin: Negative for rash.  Neurological: Negative for headaches.  Psychiatric/Behavioral: Positive for sleep disturbance (requires xanax).    Objective:  BP (!) 146/68   Pulse 61   Temp 98.1 F (36.7 C)   Ht 5' 9"  (1.753 m)   Wt 199 lb 3.2 oz (90.4 kg)   SpO2 98%    BMI 29.42 kg/m   BP Readings from Last 3 Encounters:  02/12/21 (!) 146/68  11/07/20 (!) 161/75  10/11/20 (!) 166/72    Wt Readings from Last 3 Encounters:  02/12/21 199 lb 3.2 oz (90.4 kg)  11/07/20 201 lb 6 oz (91.3 kg)  10/11/20 197 lb (89.4 kg)     Physical Exam Vitals reviewed.  Constitutional:      Appearance: He is well-developed.  HENT:     Head: Normocephalic and atraumatic.     Right Ear: Tympanic membrane and external ear normal. No decreased hearing noted.     Left Ear: Tympanic membrane and external ear normal. No decreased hearing noted.     Mouth/Throat:     Pharynx: No oropharyngeal exudate or posterior oropharyngeal erythema.  Eyes:     Pupils: Pupils are equal, round, and reactive to light.  Cardiovascular:     Rate and Rhythm: Normal rate and regular rhythm.     Heart sounds: No murmur heard.   Pulmonary:     Effort: No respiratory distress.     Breath sounds: Normal breath sounds.  Abdominal:     General: Bowel sounds are normal.     Palpations: Abdomen is soft. There is no mass.     Tenderness: There is no abdominal tenderness.  Musculoskeletal:     Cervical back: Normal range of motion and neck supple.     Diabetic Foot Exam - Simple   Simple Foot Form Diabetic Foot exam was performed with the following findings: Yes 02/12/2021  4:30 PM  Visual Inspection No deformities, no ulcerations, no other skin breakdown bilaterally: Yes Sensation Testing Intact to touch and monofilament testing bilaterally: Yes Pulse Check Posterior Tibialis and Dorsalis pulse intact bilaterally: Yes Comments     Results for orders placed or performed in visit on 02/12/21  VITAMIN D 25 Hydroxy (Vit-D Deficiency, Fractures)  Result Value Ref Range   Vit D, 25-Hydroxy 34.7 30.0 - 100.0 ng/mL  TSH + free T4  Result Value Ref Range   TSH 2.580 0.450 - 4.500 uIU/mL   Free T4 1.27 0.82 - 1.77 ng/dL  CBC with Differential/Platelet  Result Value Ref Range   WBC  8.3 3.4 - 10.8 x10E3/uL   RBC 4.61 4.14 - 5.80 x10E6/uL   Hemoglobin 14.3 13.0 - 17.7 g/dL   Hematocrit 42.3 37.5 - 51.0 %   MCV 92 79 - 97 fL   MCH 31.0 26.6 - 33.0 pg   MCHC 33.8 31.5 - 35.7 g/dL   RDW 12.4 11.6 - 15.4 %   Platelets 258 150 - 450 x10E3/uL   Neutrophils 58 Not Estab. %   Lymphs 30 Not Estab. %   Monocytes 7 Not Estab. %   Eos 4 Not Estab. %   Basos 1 Not Estab. %   Neutrophils Absolute 4.8 1.4 - 7.0 x10E3/uL   Lymphocytes Absolute 2.5 0.7 - 3.1 x10E3/uL   Monocytes Absolute 0.6 0.1 - 0.9 x10E3/uL   EOS (ABSOLUTE) 0.4 0.0 - 0.4 x10E3/uL   Basophils Absolute 0.1 0.0 - 0.2 x10E3/uL  Immature Granulocytes 0 Not Estab. %   Immature Grans (Abs) 0.0 0.0 - 0.1 x10E3/uL  CMP14+EGFR  Result Value Ref Range   Glucose 204 (H) 65 - 99 mg/dL   BUN 15 8 - 27 mg/dL   Creatinine, Ser 1.06 0.76 - 1.27 mg/dL   eGFR 79 >59 mL/min/1.73   BUN/Creatinine Ratio 14 10 - 24   Sodium 140 134 - 144 mmol/L   Potassium 4.5 3.5 - 5.2 mmol/L   Chloride 103 96 - 106 mmol/L   CO2 23 20 - 29 mmol/L   Calcium 9.6 8.6 - 10.2 mg/dL   Total Protein 6.9 6.0 - 8.5 g/dL   Albumin 4.3 3.8 - 4.8 g/dL   Globulin, Total 2.6 1.5 - 4.5 g/dL   Albumin/Globulin Ratio 1.7 1.2 - 2.2   Bilirubin Total 0.7 0.0 - 1.2 mg/dL   Alkaline Phosphatase 59 44 - 121 IU/L   AST 20 0 - 40 IU/L   ALT 26 0 - 44 IU/L  Lipid panel  Result Value Ref Range   Cholesterol, Total 164 100 - 199 mg/dL   Triglycerides 89 0 - 149 mg/dL   HDL 65 >39 mg/dL   VLDL Cholesterol Cal 16 5 - 40 mg/dL   LDL Chol Calc (NIH) 83 0 - 99 mg/dL   Chol/HDL Ratio 2.5 0.0 - 5.0 ratio  Urinalysis  Result Value Ref Range   Specific Gravity, UA 1.025 1.005 - 1.030   pH, UA 6.5 5.0 - 7.5   Color, UA Yellow Yellow   Appearance Ur Clear Clear   Leukocytes,UA Negative Negative   Protein,UA Negative Negative/Trace   Glucose, UA 2+ (A) Negative   Ketones, UA Trace (A) Negative   RBC, UA Negative Negative   Bilirubin, UA Negative Negative    Urobilinogen, Ur 1.0 0.2 - 1.0 mg/dL   Nitrite, UA Negative Negative     Assessment & Plan:   Charles Foley was seen today for medical management of chronic issues.  Diagnoses and all orders for this visit:  Type 1 diabetes mellitus without complication ([place])  Controlled substance agreement signed -     ToxASSURE Select 13 (MW), Urine  GAD (generalized anxiety disorder) -     ALPRAZolam (XANAX) 0.5 MG tablet; Take 1.5 tablets (0.75 mg total) by mouth at bedtime as needed. for sleep  Hypothyroidism, unspecified type -     TSH + free T4 -     CBC with Differential/Platelet -     CMP14+EGFR  Essential hypertension -     CBC with Differential/Platelet -     CMP14+EGFR -     Lipid panel -     Urinalysis  Vitamin D deficiency -     VITAMIN D 25 Hydroxy (Vit-D Deficiency, Fractures)  Mixed hyperlipidemia -     Lipid panel  Other orders -     traZODone (DESYREL) 150 MG tablet; Use from 1/3 to 1 tablet nightly as needed for sleep. -     lisinopril-hydrochlorothiazide (ZESTORETIC) 20-12.5 MG tablet; Take 1 tablet by mouth daily.   I have discontinued Charles Land "[name]"'s lisinopril, amoxicillin-clavulanate, dextromethorphan-guaiFENesin, and predniSONE. I have also changed his ALPRAZolam. Additionally, I am having him start on traZODone and lisinopril-hydrochlorothiazide. Lastly, I am having him maintain his acetaminophen, multivitamin, cetirizine, clobetasol, pantoprazole, levothyroxine, busPIRone, atorvastatin, Toujeo Max SoloStar, insulin lispro, and triamcinolone cream.  Meds ordered this encounter  Medications  . ALPRAZolam (XANAX) 0.5 MG tablet    Sig: Take 1.5 tablets (0.75 mg total) by mouth  at bedtime as needed. for sleep    Dispense:  45 tablet    Refill:  5  . traZODone (DESYREL) 150 MG tablet    Sig: Use from 1/3 to 1 tablet nightly as needed for sleep.    Dispense:  30 tablet    Refill:  5  . lisinopril-hydrochlorothiazide (ZESTORETIC) 20-12.5 MG tablet    Sig: Take 1  tablet by mouth daily.    Dispense:  90 tablet    Refill:  3     Follow-up: Return in about 3 months (around 05/15/2021).  Claretta Fraise, M.D.

## 2021-02-13 LAB — CBC WITH DIFFERENTIAL/PLATELET
Basophils Absolute: 0.1 10*3/uL (ref 0.0–0.2)
Basos: 1 %
EOS (ABSOLUTE): 0.4 10*3/uL (ref 0.0–0.4)
Eos: 4 %
Hematocrit: 42.3 % (ref 37.5–51.0)
Hemoglobin: 14.3 g/dL (ref 13.0–17.7)
Immature Grans (Abs): 0 10*3/uL (ref 0.0–0.1)
Immature Granulocytes: 0 %
Lymphocytes Absolute: 2.5 10*3/uL (ref 0.7–3.1)
Lymphs: 30 %
MCH: 31 pg (ref 26.6–33.0)
MCHC: 33.8 g/dL (ref 31.5–35.7)
MCV: 92 fL (ref 79–97)
Monocytes Absolute: 0.6 10*3/uL (ref 0.1–0.9)
Monocytes: 7 %
Neutrophils Absolute: 4.8 10*3/uL (ref 1.4–7.0)
Neutrophils: 58 %
Platelets: 258 10*3/uL (ref 150–450)
RBC: 4.61 x10E6/uL (ref 4.14–5.80)
RDW: 12.4 % (ref 11.6–15.4)
WBC: 8.3 10*3/uL (ref 3.4–10.8)

## 2021-02-13 LAB — CMP14+EGFR
ALT: 26 IU/L (ref 0–44)
AST: 20 IU/L (ref 0–40)
Albumin/Globulin Ratio: 1.7 (ref 1.2–2.2)
Albumin: 4.3 g/dL (ref 3.8–4.8)
Alkaline Phosphatase: 59 IU/L (ref 44–121)
BUN/Creatinine Ratio: 14 (ref 10–24)
BUN: 15 mg/dL (ref 8–27)
Bilirubin Total: 0.7 mg/dL (ref 0.0–1.2)
CO2: 23 mmol/L (ref 20–29)
Calcium: 9.6 mg/dL (ref 8.6–10.2)
Chloride: 103 mmol/L (ref 96–106)
Creatinine, Ser: 1.06 mg/dL (ref 0.76–1.27)
Globulin, Total: 2.6 g/dL (ref 1.5–4.5)
Glucose: 204 mg/dL — ABNORMAL HIGH (ref 65–99)
Potassium: 4.5 mmol/L (ref 3.5–5.2)
Sodium: 140 mmol/L (ref 134–144)
Total Protein: 6.9 g/dL (ref 6.0–8.5)
eGFR: 79 mL/min/{1.73_m2} (ref >59)

## 2021-02-13 LAB — VITAMIN D 25 HYDROXY (VIT D DEFICIENCY, FRACTURES): Vit D, 25-Hydroxy: 34.7 ng/mL (ref 30.0–100.0)

## 2021-02-13 LAB — LIPID PANEL
Chol/HDL Ratio: 2.5 ratio (ref 0.0–5.0)
Cholesterol, Total: 164 mg/dL (ref 100–199)
HDL: 65 mg/dL (ref >39)
LDL Chol Calc (NIH): 83 mg/dL (ref 0–99)
Triglycerides: 89 mg/dL (ref 0–149)
VLDL Cholesterol Cal: 16 mg/dL (ref 5–40)

## 2021-02-13 LAB — TSH+FREE T4
Free T4: 1.27 ng/dL (ref 0.82–1.77)
TSH: 2.58 u[IU]/mL (ref 0.450–4.500)

## 2021-02-13 NOTE — Progress Notes (Signed)
Hello Marven,  Your lab result is normal and/or stable.Some minor variations that are not significant are commonly marked abnormal, but do not represent any medical problem for you.  Best regards, Taeden Geller, M.D.

## 2021-02-18 ENCOUNTER — Encounter: Payer: Self-pay | Admitting: Family Medicine

## 2021-02-20 LAB — TOXASSURE SELECT 13 (MW), URINE

## 2021-04-16 ENCOUNTER — Encounter: Payer: Self-pay | Admitting: Family Medicine

## 2021-04-16 ENCOUNTER — Ambulatory Visit (INDEPENDENT_AMBULATORY_CARE_PROVIDER_SITE_OTHER): Payer: 59 | Admitting: Family Medicine

## 2021-04-16 ENCOUNTER — Other Ambulatory Visit: Payer: Self-pay

## 2021-04-16 VITALS — BP 134/69 | HR 68 | Temp 97.9°F | Ht 69.0 in | Wt 193.2 lb

## 2021-04-16 DIAGNOSIS — Z1321 Encounter for screening for nutritional disorder: Secondary | ICD-10-CM

## 2021-04-16 DIAGNOSIS — Z125 Encounter for screening for malignant neoplasm of prostate: Secondary | ICD-10-CM

## 2021-04-16 DIAGNOSIS — F411 Generalized anxiety disorder: Secondary | ICD-10-CM | POA: Diagnosis not present

## 2021-04-16 DIAGNOSIS — K219 Gastro-esophageal reflux disease without esophagitis: Secondary | ICD-10-CM

## 2021-04-16 DIAGNOSIS — Z0001 Encounter for general adult medical examination with abnormal findings: Secondary | ICD-10-CM | POA: Diagnosis not present

## 2021-04-16 DIAGNOSIS — E109 Type 1 diabetes mellitus without complications: Secondary | ICD-10-CM

## 2021-04-16 DIAGNOSIS — I10 Essential (primary) hypertension: Secondary | ICD-10-CM

## 2021-04-16 DIAGNOSIS — Z8546 Personal history of malignant neoplasm of prostate: Secondary | ICD-10-CM

## 2021-04-16 DIAGNOSIS — Z0000 Encounter for general adult medical examination without abnormal findings: Secondary | ICD-10-CM

## 2021-04-16 DIAGNOSIS — E039 Hypothyroidism, unspecified: Secondary | ICD-10-CM

## 2021-04-16 NOTE — Progress Notes (Addendum)
Subjective:  Patient ID: Charles Foley, male    DOB: 13-Feb-1961  Age: 60 y.o. MRN: [id number]  CC: No chief complaint on file.   HPI Charles Foley presents for CPE. Seeing endocrine Gery Pray. No reports available for DM care. Says he hasn't had any spikes in a long while.  Hx prostatectomy. Now is impotent and is prone to incontinence of urine.  Sometimes wets the bed.  Especially if he goes into a very deep sleep.  Still needs annual PSA to screen for recurrence.   Patient presents for follow-up on  thyroid. The patient has a history of hypothyroidism for many years. It has been stable recently. Pt. denies any change in  voice, loss of hair, heat or cold intolerance. Energy level has been adequate to good. Patient denies constipation and diarrhea. No myxedema. Medication is as noted below. Verified that pt is taking it daily on an empty stomach. Well tolerated.  Patient in for follow-up of elevated cholesterol. Doing well without complaints on current medication. Denies side effects of statin including myalgia and arthralgia and nausea. Also in today for liver function testing. Currently no chest pain, shortness of breath or other cardiovascular related symptoms noted.  He is also treated with BuSpar for his anxiety.  Xanax at bedtime that also helps with sleep.  Controlled substances agreement is up.  Patient in for follow-up of GERD. Currently asymptomatic taking  PPI daily. There is no chest pain or heartburn. No hematemesis and no melena. No dysphagia or choking. Onset is remote. Progression is stable. Complicating factors, none.   Follow-up of hypertension. Patient has no history of headache chest pain or shortness of breath or recent cough. Patient also denies symptoms of TIA such as numbness weakness lateralizing. Patient checks  blood pressure at home and has not had any elevated readings recently. Patient denies side effects from his medication. States taking it  regularly.  Depression screen [facility] 2/9 04/16/2021 02/12/2021 11/07/2020  Decreased Interest 0 0 0  Down, Depressed, Hopeless 0 0 0  PHQ - 2 Score 0 0 0  Altered sleeping - - -  Tired, decreased energy - - -  Change in appetite - - -  Feeling bad or failure about yourself  - - -  Trouble concentrating - - -  Moving slowly or fidgety/restless - - -  Suicidal thoughts - - -  PHQ-9 Score - - -    History Charles Foley has a past medical history of Acute pain of right knee (06/17/2017), Allergic rhinitis, Anxiety, Arthritis, Cancer ([place]) (2012), Chest pain (11/02/2014), Diabetes mellitus without complication ([place]), Hypertension, and Hypothyroidism.   He has a past surgical history that includes Prostatectomy (2012); ORIF facial fracture (1988); Knee arthroscopy (Bilateral); Colonoscopy; and Lumbar laminectomy/decompression microdiscectomy (N/A, 10/11/2020).   His family history includes Diabetes in his mother; Heart disease in his mother.He reports that he quit smoking about 12 years ago. His smoking use included cigarettes. He quit smokeless tobacco use about 18 years ago. He reports previous alcohol use. He reports that he does not use drugs.    ROS Review of Systems  Constitutional:  Negative for activity change, fatigue and unexpected weight change.  HENT:  Negative for congestion, ear pain, hearing loss, postnasal drip and trouble swallowing.   Eyes:  Negative for pain and visual disturbance.  Respiratory:  Negative for cough, chest tightness and shortness of breath.   Cardiovascular:  Negative for chest pain, palpitations and leg swelling.  Gastrointestinal:  Negative for abdominal  distention, abdominal pain, blood in stool, constipation, diarrhea, nausea and vomiting.  Endocrine: Negative for cold intolerance, heat intolerance and polydipsia.  Genitourinary:  Positive for difficulty urinating. Negative for dysuria, flank pain, frequency and urgency.  Musculoskeletal:  Negative for arthralgias  and joint swelling.  Skin:  Negative for color change, rash and wound.  Neurological:  Negative for dizziness, syncope, speech difficulty, weakness, light-headedness, numbness and headaches.  Hematological:  Does not bruise/bleed easily.  Psychiatric/Behavioral:  Negative for confusion, decreased concentration, dysphoric mood and sleep disturbance. The patient is not nervous/anxious.    Objective:  BP 134/69   Pulse 68   Temp 97.9 F (36.6 C)   Ht '5\' 9"'$  (1.753 m)   Wt 193 lb 3.2 oz (87.6 kg)   SpO2 99%   BMI 28.53 kg/m   BP Readings from Last 3 Encounters:  04/16/21 134/69  02/12/21 (!) 146/68  11/07/20 (!) 161/75    Wt Readings from Last 3 Encounters:  04/16/21 193 lb 3.2 oz (87.6 kg)  02/12/21 199 lb 3.2 oz (90.4 kg)  11/07/20 201 lb 6 oz (91.3 kg)     Physical Exam Constitutional:      Appearance: He is well-developed.  HENT:     Head: Normocephalic and atraumatic.  Eyes:     Pupils: Pupils are equal, round, and reactive to light.  Neck:     Thyroid: No thyromegaly.     Trachea: No tracheal deviation.  Cardiovascular:     Rate and Rhythm: Normal rate and regular rhythm.     Heart sounds: Normal heart sounds. No murmur heard.   No friction rub. No gallop.  Pulmonary:     Breath sounds: Normal breath sounds. No wheezing or rales.  Abdominal:     General: Bowel sounds are normal. There is no distension.     Palpations: Abdomen is soft. There is no mass.     Tenderness: There is no abdominal tenderness.     Hernia: There is no hernia in the left inguinal area.  Genitourinary:    Penis: Normal.      Testes: Normal.  Musculoskeletal:        General: Normal range of motion.     Cervical back: Normal range of motion.  Lymphadenopathy:     Cervical: No cervical adenopathy.  Skin:    General: Skin is warm and dry.  Neurological:     Mental Status: He is alert and oriented to person, place, and time.      Assessment & Plan:   Diagnoses and all orders for  this visit:  Well adult exam  Encounter for vitamin deficiency screening  Screening for prostate cancer -     PSA, total and free  GAD (generalized anxiety disorder)  Hypothyroidism, unspecified type  Type 1 diabetes mellitus without complication ([place])  Essential hypertension  Personal history of prostate cancer  Gastroesophageal reflux disease without esophagitis      I am having Charles Foley "Charles Foley" maintain his acetaminophen, multivitamin, cetirizine, clobetasol, pantoprazole, levothyroxine, busPIRone, atorvastatin, Toujeo Max SoloStar, insulin lispro, triamcinolone cream, ALPRAZolam, traZODone, and lisinopril-hydrochlorothiazide.  Allergies as of 04/16/2021   No Known Allergies      Medication List        Accurate as of April 16, 2021  1:51 PM. If you have any questions, ask your nurse or doctor.          acetaminophen 325 MG tablet Commonly known as: TYLENOL Take 650 mg by mouth every 6 (six) hours as  needed for pain.   ALPRAZolam 0.5 MG tablet Commonly known as: XANAX Take 1.5 tablets (0.75 mg total) by mouth at bedtime as needed. for sleep   atorvastatin 10 MG tablet Commonly known as: LIPITOR Take 1 tablet (10 mg total) by mouth daily.   busPIRone 10 MG tablet Commonly known as: BUSPAR Take 1 tablet (10 mg total) by mouth 3 (three) times daily.   cetirizine 10 MG tablet Commonly known as: ZYRTEC Take 1 tablet (10 mg total) by mouth daily.   clobetasol 0.05 % external solution Commonly known as: TEMOVATE Apply 1 application topically 2 (two) times daily.   insulin lispro 100 UNIT/ML KwikPen Commonly known as: HUMALOG INJECT UP TO 20 UNITS 3 TIMES A DAY BEFORE MEALS   levothyroxine 25 MCG tablet Commonly known as: SYNTHROID Take 1 tablet (25 mcg total) by mouth daily.   lisinopril-hydrochlorothiazide 20-12.5 MG tablet Commonly known as: Zestoretic Take 1 tablet by mouth daily.   multivitamin tablet Take 1 tablet by mouth daily.    pantoprazole 40 MG tablet Commonly known as: PROTONIX Take 1 tablet (40 mg total) by mouth daily.   Toujeo Max SoloStar 300 UNIT/ML Solostar Pen Generic drug: insulin glargine (2 Unit Dial) Inject 40 Units into the skin in the morning and at bedtime.   traZODone 150 MG tablet Commonly known as: DESYREL Use from 1/3 to 1 tablet nightly as needed for sleep.   triamcinolone cream 0.1 % Commonly known as: KENALOG APPLY 1 APPLICATION TOPICALLY 2 TIMES DAILY         Follow-up: Return in about 3 months (around 07/17/2021).  Claretta Fraise, M.D.

## 2021-04-17 LAB — PSA, TOTAL AND FREE
PSA, Free: <0.01 ng/mL
Prostate Specific Ag, Serum: <0.1 ng/mL (ref 0.0–4.0)

## 2021-04-22 ENCOUNTER — Other Ambulatory Visit: Payer: Self-pay | Admitting: Family Medicine

## 2021-05-22 ENCOUNTER — Other Ambulatory Visit: Payer: Self-pay | Admitting: Family Medicine

## 2021-05-22 DIAGNOSIS — E039 Hypothyroidism, unspecified: Secondary | ICD-10-CM

## 2021-07-23 ENCOUNTER — Other Ambulatory Visit: Payer: Self-pay | Admitting: Family Medicine

## 2021-07-23 DIAGNOSIS — E109 Type 1 diabetes mellitus without complications: Secondary | ICD-10-CM

## 2021-08-19 ENCOUNTER — Ambulatory Visit: Payer: 59 | Admitting: Family Medicine

## 2021-08-19 ENCOUNTER — Encounter: Payer: Self-pay | Admitting: Family Medicine

## 2021-08-19 VITALS — BP 124/67 | HR 65 | Temp 98.2°F | Ht 69.0 in | Wt 185.4 lb

## 2021-08-19 DIAGNOSIS — I10 Essential (primary) hypertension: Secondary | ICD-10-CM | POA: Diagnosis not present

## 2021-08-19 DIAGNOSIS — E039 Hypothyroidism, unspecified: Secondary | ICD-10-CM

## 2021-08-19 DIAGNOSIS — F411 Generalized anxiety disorder: Secondary | ICD-10-CM

## 2021-08-19 DIAGNOSIS — E782 Mixed hyperlipidemia: Secondary | ICD-10-CM

## 2021-08-19 DIAGNOSIS — E109 Type 1 diabetes mellitus without complications: Secondary | ICD-10-CM | POA: Diagnosis not present

## 2021-08-19 MED ORDER — PANTOPRAZOLE SODIUM 40 MG PO TBEC: 40.0000 mg | DELAYED_RELEASE_TABLET | Freq: Every day | ORAL | 3 refills | Status: DC

## 2021-08-19 MED ORDER — CLOBETASOL PROPIONATE 0.05 % EX SOLN: 1.0000 "application " | Freq: Two times a day (BID) | CUTANEOUS | 5 refills | Status: DC

## 2021-08-19 MED ORDER — ATORVASTATIN CALCIUM 10 MG PO TABS: 10.0000 mg | ORAL_TABLET | Freq: Every day | ORAL | 2 refills | Status: DC

## 2021-08-19 MED ORDER — ALPRAZOLAM 0.5 MG PO TABS
0.7500 mg | ORAL_TABLET | Freq: Every evening | ORAL | 5 refills | Status: DC | PRN
Start: 2021-08-19 — End: 2022-02-24

## 2021-08-19 MED ORDER — TRAZODONE HCL 150 MG PO TABS: ORAL_TABLET | ORAL | 3 refills | Status: DC

## 2021-08-19 MED ORDER — AMOXICILLIN-POT CLAVULANATE 875-125 MG PO TABS: 1.0000 | ORAL_TABLET | Freq: Two times a day (BID) | ORAL | 0 refills | Status: DC

## 2021-08-19 NOTE — Progress Notes (Addendum)
Subjective:  Patient ID: Charles Foley, male    DOB: [birth year]  Age: 60 y.o. MRN: [id number]  CC: Medical Management of Chronic Issues   HPI Charles Foley presents for Patient presents with upper respiratory congestion. Rhinorrhea that is frequently purulent. There is moderate sore throat with posterior drainage. Patient reports coughing frequently as well.  No sputum noted. There is no fever, chills or sweats. The patient denies being short of breath. Onset was 3-5 days ago. Gradually worsening. Tried OTCs without improvement.  Dependent on xanax at bedtime. Helps with nerves and with sleep. Ran out 2 days ago and hasn't slept at all the last two nights.  A1c dropped to 7.6 from 10+ using ozempic. Insurance refuses to cover it since he is a Type 1 diabetic.    Depression screen [facility] 2/9 08/19/2021 04/16/2021 02/12/2021  Decreased Interest 0 0 0  Down, Depressed, Hopeless 0 0 0  PHQ - 2 Score 0 0 0  Altered sleeping - - -  Tired, decreased energy - - -  Change in appetite - - -  Feeling bad or failure about yourself  - - -  Trouble concentrating - - -  Moving slowly or fidgety/restless - - -  Suicidal thoughts - - -  PHQ-9 Score - - -    History Charles Foley has a past medical history of Acute pain of right knee (06/17/2017), Allergic rhinitis, Anxiety, Arthritis, Cancer ([place]) (2012), Chest pain (11/02/2014), Diabetes mellitus without complication ([place]), Hypertension, and Hypothyroidism.   He has a past surgical history that includes Prostatectomy (2012); ORIF facial fracture (1988); Knee arthroscopy (Bilateral); Colonoscopy; and Lumbar laminectomy/decompression microdiscectomy (N/A, 10/11/2020).   His family history includes Diabetes in his mother; Heart disease in his mother.He reports that he quit smoking about 12 years ago. His smoking use included cigarettes. He quit smokeless tobacco use about 18 years ago. He reports that he does not currently use alcohol. He reports that he does not use  drugs.    ROS Review of Systems  Constitutional:  Negative for fever.  Respiratory:  Negative for shortness of breath.   Cardiovascular:  Negative for chest pain.  Musculoskeletal:  Negative for arthralgias.  Skin:  Negative for rash.   Objective:  BP 124/67   Pulse 65   Temp 98.2 F (36.8 C)   Ht 5' 9"  (1.753 m)   Wt 185 lb 6.4 oz (84.1 kg)   SpO2 97%   BMI 27.38 kg/m   BP Readings from Last 3 Encounters:  08/19/21 124/67  04/16/21 134/69  02/12/21 (!) 146/68    Wt Readings from Last 3 Encounters:  08/19/21 185 lb 6.4 oz (84.1 kg)  04/16/21 193 lb 3.2 oz (87.6 kg)  02/12/21 199 lb 3.2 oz (90.4 kg)     Physical Exam Constitutional:      General: He is not in acute distress.    Appearance: He is well-developed.  HENT:     Head: Normocephalic and atraumatic.     Right Ear: External ear normal.     Left Ear: External ear normal.     Nose: Nose normal.  Eyes:     Conjunctiva/sclera: Conjunctivae normal.     Pupils: Pupils are equal, round, and reactive to light.  Cardiovascular:     Rate and Rhythm: Normal rate and regular rhythm.     Heart sounds: Normal heart sounds. No murmur heard. Pulmonary:     Effort: Pulmonary effort is normal. No respiratory distress.     Breath  sounds: Normal breath sounds. No wheezing or rales.  Abdominal:     Palpations: Abdomen is soft.     Tenderness: There is no abdominal tenderness.  Musculoskeletal:        General: Normal range of motion.     Cervical back: Normal range of motion and neck supple.  Skin:    General: Skin is warm and dry.  Neurological:     Mental Status: He is alert and oriented to person, place, and time.     Deep Tendon Reflexes: Reflexes are normal and symmetric.  Psychiatric:        Behavior: Behavior normal.        Thought Content: Thought content normal.        Judgment: Judgment normal.      Assessment & Plan:   Gorge was seen today for medical management of chronic issues.  Diagnoses  and all orders for this visit:  Essential hypertension -     CBC with Differential/Platelet -     CMP14+EGFR  GAD (generalized anxiety disorder) -     ALPRAZolam (XANAX) 0.5 MG tablet; Take 1.5 tablets (0.75 mg total) by mouth at bedtime as needed. for sleep  Type 1 diabetes mellitus without complication (HCC) -     atorvastatin (LIPITOR) 10 MG tablet; Take 1 tablet (10 mg total) by mouth daily.  Mixed hyperlipidemia -     Lipid panel  Hypothyroidism, unspecified type -     TSH + free T4  Other orders -     traZODone (DESYREL) 150 MG tablet; Use from 1/3 to 1 tablet nightly as needed for sleep. -     pantoprazole (PROTONIX) 40 MG tablet; Take 1 tablet (40 mg total) by mouth daily. -     clobetasol (TEMOVATE) 0.05 % external solution; Apply 1 application topically 2 (two) times daily. -     amoxicillin-clavulanate (AUGMENTIN) 875-125 MG tablet; Take 1 tablet by mouth 2 (two) times daily. Take all of this medication      I have discontinued Charles Foley "[name]"'s clobetasol. I have also changed his atorvastatin and pantoprazole. Additionally, I am having him start on clobetasol and amoxicillin-clavulanate. Lastly, I am having him maintain his acetaminophen, multivitamin, cetirizine, busPIRone, Toujeo Max SoloStar, insulin lispro, triamcinolone cream, lisinopril-hydrochlorothiazide, levothyroxine, traZODone, and ALPRAZolam.  Allergies as of 08/19/2021   No Known Allergies      Medication List        Accurate as of August 19, 2021  5:56 PM. If you have any questions, ask your nurse or doctor.          acetaminophen 325 MG tablet Commonly known as: TYLENOL Take 650 mg by mouth every 6 (six) hours as needed for pain.   ALPRAZolam 0.5 MG tablet Commonly known as: XANAX Take 1.5 tablets (0.75 mg total) by mouth at bedtime as needed. for sleep   amoxicillin-clavulanate 875-125 MG tablet Commonly known as: AUGMENTIN Take 1 tablet by mouth 2 (two) times daily. Take all of  this medication Started by: Claretta Fraise, MD   atorvastatin 10 MG tablet Commonly known as: LIPITOR Take 1 tablet (10 mg total) by mouth daily.   busPIRone 10 MG tablet Commonly known as: BUSPAR Take 1 tablet (10 mg total) by mouth 3 (three) times daily.   cetirizine 10 MG tablet Commonly known as: ZYRTEC Take 1 tablet (10 mg total) by mouth daily.   clobetasol 0.05 % external solution Commonly known as: TEMOVATE Apply 1 application topically 2 (two) times daily.  insulin lispro 100 UNIT/ML KwikPen Commonly known as: HUMALOG INJECT UP TO 20 UNITS 3 TIMES A DAY BEFORE MEALS   levothyroxine 25 MCG tablet Commonly known as: SYNTHROID TAKE 1 TABLET DAILY   lisinopril-hydrochlorothiazide 20-12.5 MG tablet Commonly known as: Zestoretic Take 1 tablet by mouth daily.   multivitamin tablet Take 1 tablet by mouth daily.   pantoprazole 40 MG tablet Commonly known as: PROTONIX Take 1 tablet (40 mg total) by mouth daily.   Toujeo Max SoloStar 300 UNIT/ML Solostar Pen Generic drug: insulin glargine (2 Unit Dial) Inject 40 Units into the skin in the morning and at bedtime.   traZODone 150 MG tablet Commonly known as: DESYREL Use from 1/3 to 1 tablet nightly as needed for sleep.   triamcinolone cream 0.1 % Commonly known as: KENALOG APPLY 1 APPLICATION TOPICALLY 2 TIMES DAILY         Follow-up: Return in about 6 months (around 02/16/2022).  Claretta Fraise, M.D.

## 2021-08-19 NOTE — Addendum Note (Signed)
Addended by: Claretta Fraise on: 08/19/2021 05:56 PM   Modules accepted: Orders

## 2021-08-20 LAB — CMP14+EGFR
ALT: 20 IU/L (ref 0–44)
AST: 23 IU/L (ref 0–40)
Albumin/Globulin Ratio: 1.4 (ref 1.2–2.2)
Albumin: 4.6 g/dL (ref 3.8–4.8)
Alkaline Phosphatase: 64 IU/L (ref 44–121)
BUN/Creatinine Ratio: 12 (ref 10–24)
BUN: 10 mg/dL (ref 8–27)
Bilirubin Total: 1 mg/dL (ref 0.0–1.2)
CO2: 23 mmol/L (ref 20–29)
Calcium: 10.4 mg/dL — ABNORMAL HIGH (ref 8.6–10.2)
Chloride: 102 mmol/L (ref 96–106)
Creatinine, Ser: 0.84 mg/dL (ref 0.76–1.27)
Globulin, Total: 3.2 g/dL (ref 1.5–4.5)
Glucose: 166 mg/dL — ABNORMAL HIGH (ref 70–99)
Potassium: 5.2 mmol/L (ref 3.5–5.2)
Sodium: 140 mmol/L (ref 134–144)
Total Protein: 7.8 g/dL (ref 6.0–8.5)
eGFR: 99 mL/min/{1.73_m2} (ref >59)

## 2021-08-20 LAB — CBC WITH DIFFERENTIAL/PLATELET
Basophils Absolute: 0.1 10*3/uL (ref 0.0–0.2)
Basos: 1 %
EOS (ABSOLUTE): 0.3 10*3/uL (ref 0.0–0.4)
Eos: 3 %
Hematocrit: 45.1 % (ref 37.5–51.0)
Hemoglobin: 15 g/dL (ref 13.0–17.7)
Immature Grans (Abs): 0 10*3/uL (ref 0.0–0.1)
Immature Granulocytes: 0 %
Lymphocytes Absolute: 2.9 10*3/uL (ref 0.7–3.1)
Lymphs: 34 %
MCH: 31.3 pg (ref 26.6–33.0)
MCHC: 33.3 g/dL (ref 31.5–35.7)
MCV: 94 fL (ref 79–97)
Monocytes Absolute: 0.5 10*3/uL (ref 0.1–0.9)
Monocytes: 6 %
Neutrophils Absolute: 4.7 10*3/uL (ref 1.4–7.0)
Neutrophils: 56 %
Platelets: 320 10*3/uL (ref 150–450)
RBC: 4.79 x10E6/uL (ref 4.14–5.80)
RDW: 12.1 % (ref 11.6–15.4)
WBC: 8.4 10*3/uL (ref 3.4–10.8)

## 2021-08-20 LAB — LIPID PANEL
Chol/HDL Ratio: 2.5 ratio (ref 0.0–5.0)
Cholesterol, Total: 149 mg/dL (ref 100–199)
HDL: 60 mg/dL (ref >39)
LDL Chol Calc (NIH): 72 mg/dL (ref 0–99)
Triglycerides: 88 mg/dL (ref 0–149)
VLDL Cholesterol Cal: 17 mg/dL (ref 5–40)

## 2021-08-20 LAB — TSH+FREE T4
Free T4: 1.38 ng/dL (ref 0.82–1.77)
TSH: 2.93 u[IU]/mL (ref 0.450–4.500)

## 2021-08-20 NOTE — Progress Notes (Signed)
Hello Charles Foley,  Your lab result is normal and/or stable.Some minor variations that are not significant are commonly marked abnormal, but do not represent any medical problem for you.  Best regards, Ameya Kutz, M.D.

## 2021-09-25 ENCOUNTER — Ambulatory Visit: Payer: 59 | Admitting: Family Medicine

## 2021-09-25 ENCOUNTER — Encounter: Payer: Self-pay | Admitting: Family Medicine

## 2021-09-25 VITALS — BP 144/65 | HR 65 | Ht 69.0 in | Wt 193.0 lb

## 2021-09-25 DIAGNOSIS — R519 Headache, unspecified: Secondary | ICD-10-CM

## 2021-09-25 DIAGNOSIS — Z20822 Contact with and (suspected) exposure to covid-19: Secondary | ICD-10-CM

## 2021-09-25 DIAGNOSIS — R5383 Other fatigue: Secondary | ICD-10-CM

## 2021-09-25 LAB — VERITOR FLU A/B WAIVED
Influenza A: NEGATIVE
Influenza B: NEGATIVE

## 2021-09-25 MED ORDER — MOLNUPIRAVIR EUA 200MG CAPSULE: 4.0000 | ORAL_CAPSULE | Freq: Two times a day (BID) | ORAL | 0 refills | Status: AC

## 2021-09-25 NOTE — Progress Notes (Signed)
BP (!) 144/65    Pulse 65    Ht 5\' 9"  (1.753 m)    Wt 193 lb (87.5 kg)    SpO2 96%    BMI 28.50 kg/m    Subjective:   Patient ID: Charles Foley, male    DOB: 05-Feb-1961, 61 y.o.   MRN: [id number]  HPI: Charles Foley is a 61 y.o. male presenting on 09/25/2021 for Headache and Fatigue (weak)   HPI Patient is coming in today with feelings of cough and congestion and fatigue and headaches and drainage and stomach irritation that is been going on over the past 2 or 3 days.  He says his brother was sick this last weekend and tested + 4 days ago for COVID.  He says he started up with the symptoms not too long after.  He denies any shortness of breath or wheezing.  He just feels "like he has been hit by a truck".  Relevant past medical, surgical, family and social history reviewed and updated as indicated. Interim medical history since our last visit reviewed. Allergies and medications reviewed and updated.  Review of Systems  Constitutional:  Positive for chills. Negative for fever.  HENT:  Positive for congestion, postnasal drip, rhinorrhea, sinus pressure, sneezing and sore throat. Negative for ear discharge, ear pain and voice change.   Eyes:  Negative for pain, discharge, redness and visual disturbance.  Respiratory:  Positive for cough. Negative for shortness of breath and wheezing.   Cardiovascular:  Negative for chest pain and leg swelling.  Musculoskeletal:  Positive for myalgias. Negative for gait problem.  Skin:  Negative for rash.  All other systems reviewed and are negative.  Per HPI unless specifically indicated above   Allergies as of 09/25/2021   No Known Allergies      Medication List        Accurate as of September 25, 2021  2:13 PM. If you have any questions, ask your nurse or doctor.          acetaminophen 325 MG tablet Commonly known as: TYLENOL Take 650 mg by mouth every 6 (six) hours as needed for pain.   ALPRAZolam 0.5 MG tablet Commonly known as: XANAX Take  1.5 tablets (0.75 mg total) by mouth at bedtime as needed. for sleep   amoxicillin-clavulanate 875-125 MG tablet Commonly known as: AUGMENTIN Take 1 tablet by mouth 2 (two) times daily. Take all of this medication   atorvastatin 10 MG tablet Commonly known as: LIPITOR Take 1 tablet (10 mg total) by mouth daily.   busPIRone 10 MG tablet Commonly known as: BUSPAR Take 1 tablet (10 mg total) by mouth 3 (three) times daily.   cetirizine 10 MG tablet Commonly known as: ZYRTEC Take 1 tablet (10 mg total) by mouth daily.   clobetasol 0.05 % external solution Commonly known as: TEMOVATE Apply 1 application topically 2 (two) times daily.   insulin lispro 100 UNIT/ML KwikPen Commonly known as: HUMALOG INJECT UP TO 20 UNITS 3 TIMES A DAY BEFORE MEALS   levothyroxine 25 MCG tablet Commonly known as: SYNTHROID TAKE 1 TABLET DAILY   lisinopril-hydrochlorothiazide 20-12.5 MG tablet Commonly known as: Zestoretic Take 1 tablet by mouth daily.   molnupiravir EUA 200 mg Caps capsule Commonly known as: LAGEVRIO Take 4 capsules (800 mg total) by mouth 2 (two) times daily for 5 days. Started by: Worthy Rancher, MD   multivitamin tablet Take 1 tablet by mouth daily.   pantoprazole 40 MG tablet Commonly known as:  PROTONIX Take 1 tablet (40 mg total) by mouth daily.   Toujeo Max SoloStar 300 UNIT/ML Solostar Pen Generic drug: insulin glargine (2 Unit Dial) Inject 40 Units into the skin in the morning and at bedtime.   traZODone 150 MG tablet Commonly known as: DESYREL Use from 1/3 to 1 tablet nightly as needed for sleep.   triamcinolone cream 0.1 % Commonly known as: KENALOG APPLY 1 APPLICATION TOPICALLY 2 TIMES DAILY         Objective:   BP (!) 144/65    Pulse 65    Ht 5\' 9"  (1.753 m)    Wt 193 lb (87.5 kg)    SpO2 96%    BMI 28.50 kg/m   Wt Readings from Last 3 Encounters:  09/25/21 193 lb (87.5 kg)  08/19/21 185 lb 6.4 oz (84.1 kg)  04/16/21 193 lb 3.2 oz (87.6 kg)     Physical Exam Vitals and nursing note reviewed.  Constitutional:      General: He is not in acute distress.    Appearance: He is well-developed. He is not diaphoretic.  HENT:     Right Ear: Tympanic membrane, ear canal and external ear normal.     Left Ear: Tympanic membrane, ear canal and external ear normal.     Nose: Mucosal edema and rhinorrhea present.     Right Sinus: Maxillary sinus tenderness present. No frontal sinus tenderness.     Left Sinus: Maxillary sinus tenderness present. No frontal sinus tenderness.     Mouth/Throat:     Pharynx: Uvula midline. Posterior oropharyngeal erythema present. No oropharyngeal exudate.     Tonsils: No tonsillar abscesses.  Eyes:     General: No scleral icterus.    Conjunctiva/sclera: Conjunctivae normal.  Neck:     Thyroid: No thyromegaly.  Cardiovascular:     Rate and Rhythm: Normal rate and regular rhythm.     Heart sounds: Normal heart sounds. No murmur heard. Pulmonary:     Effort: Pulmonary effort is normal. No respiratory distress.     Breath sounds: Normal breath sounds. No wheezing or rales.  Musculoskeletal:        General: Normal range of motion.     Cervical back: Neck supple.  Lymphadenopathy:     Cervical: No cervical adenopathy.  Skin:    General: Skin is warm and dry.     Findings: No rash.  Neurological:     Mental Status: He is alert and oriented to person, place, and time.     Coordination: Coordination normal.  Psychiatric:        Behavior: Behavior normal.      Assessment & Plan:   Problem List Items Addressed This Visit   None Visit Diagnoses     Other fatigue    -  Primary   Relevant Medications   molnupiravir EUA (LAGEVRIO) 200 mg CAPS capsule   Other Relevant Orders   Veritor Flu A/B Waived   Novel Coronavirus, NAA ([facility])   Nonintractable headache, unspecified chronicity pattern, unspecified headache type       Relevant Medications   molnupiravir EUA (LAGEVRIO) 200 mg CAPS capsule    Other Relevant Orders   Veritor Flu A/B Waived   Novel Coronavirus, NAA ([facility])   Close exposure to COVID-19 virus       Relevant Medications   molnupiravir EUA (LAGEVRIO) 200 mg CAPS capsule       COVID test pending  Flu neg  Will treat for Covid because of exposure and  risk  Follow up plan: Return if symptoms worsen or fail to improve.  Counseling provided for all of the vaccine components Orders Placed This Encounter  Procedures   Novel Coronavirus, NAA ([facility])   Veritor Flu A/B Brent Kharisma Glasner, MD [facility] 09/25/2021, 2:13 PM

## 2021-09-26 LAB — NOVEL CORONAVIRUS, NAA

## 2022-01-17 IMAGING — CR DG LUMBAR SPINE 2-3V
2 series · 2 of 2 positions shown · non-contrast
Comparison: 06/13/2020

CLINICAL DATA: L4-5 decompression.

EXAM:
LUMBAR SPINE - 2-3 VIEW

[lateral (1 of 2)]
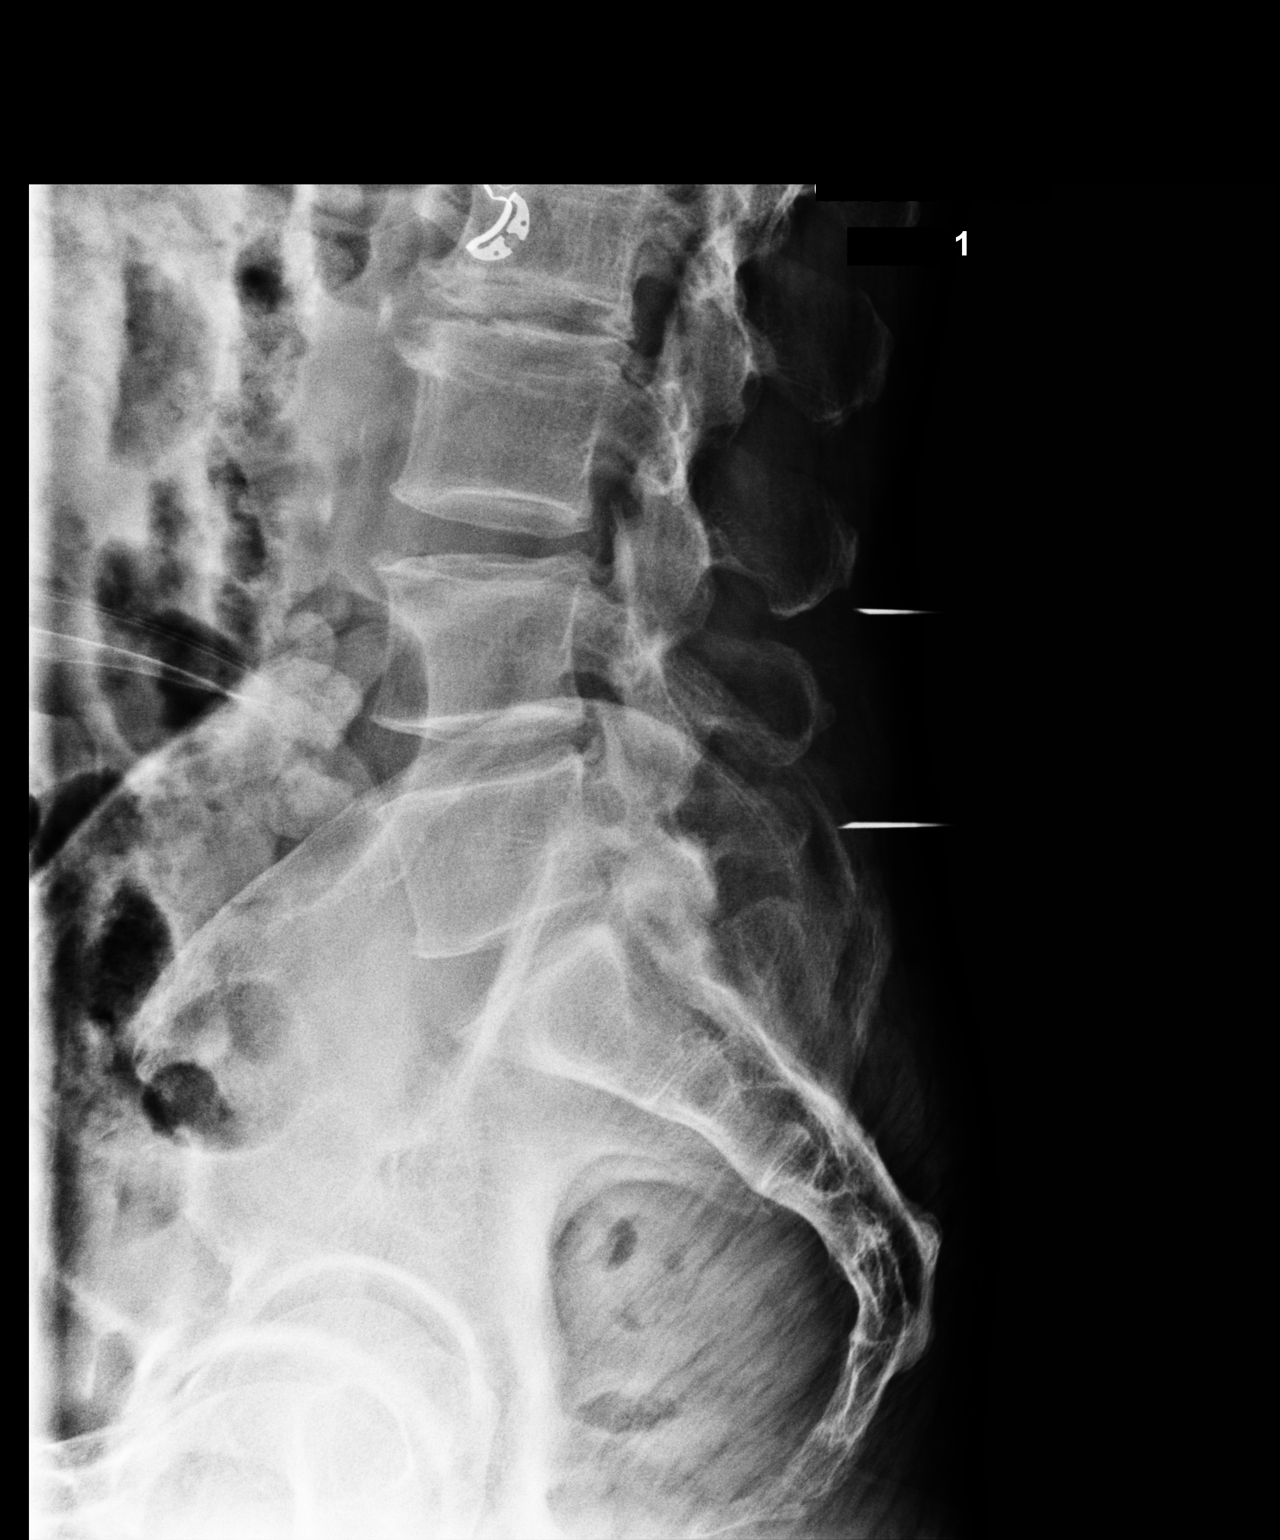

[lateral (2 of 2)]
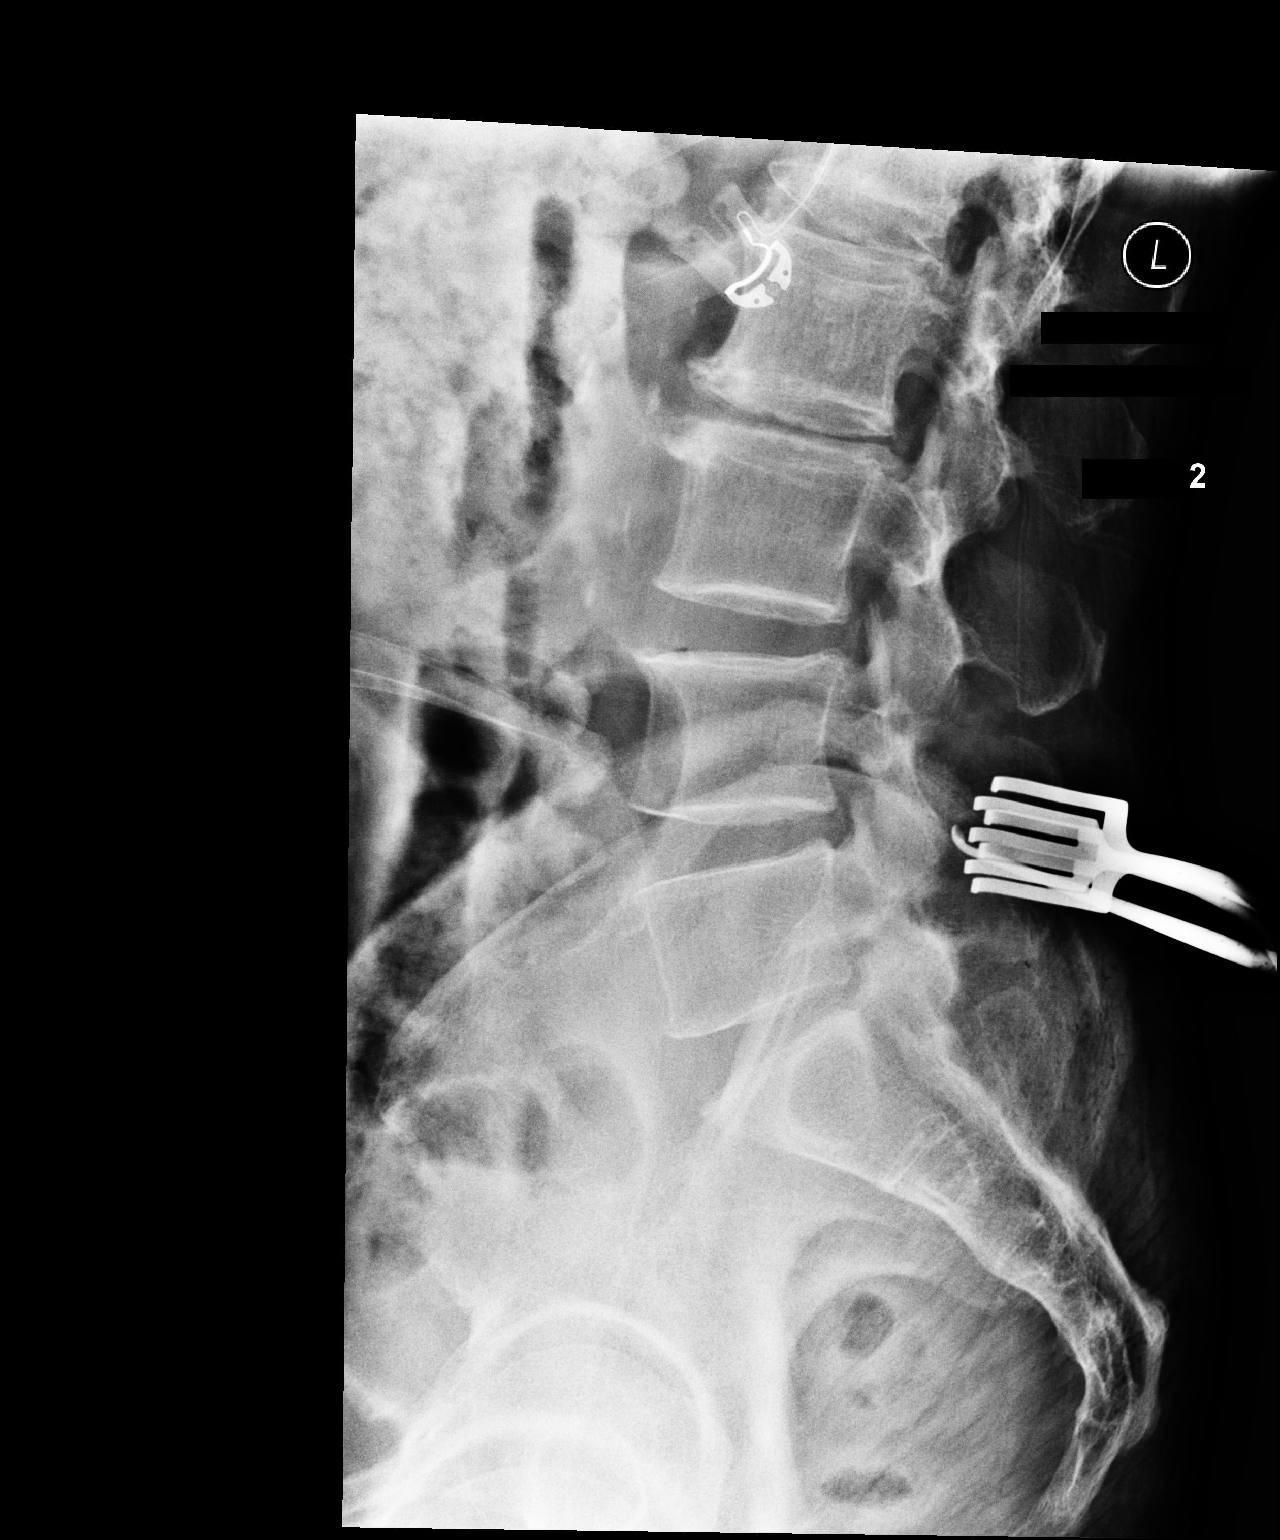

[2 of 2 positions shown; findings below may reference images not displayed]

FINDINGS: Two portable radiographs obtained in the operating room were
obtained. The lumbar spine levels have been numbered.

-On image 1 surgical probes are noted posterior to L4 and L5.

-On image 2 there are tissue spreaders and a surgical probe
posterior to the L4-5 disc space.
IMPRESSION: Portable radiographs of the lumbar spine obtained for localization.

## 2022-02-10 ENCOUNTER — Ambulatory Visit: Payer: 59 | Admitting: Family Medicine

## 2022-02-24 ENCOUNTER — Encounter: Payer: Self-pay | Admitting: Family Medicine

## 2022-02-24 ENCOUNTER — Ambulatory Visit: Payer: 59 | Admitting: Family Medicine

## 2022-02-24 VITALS — BP 129/70 | HR 58 | Temp 98.6°F | Ht 69.0 in | Wt 190.0 lb

## 2022-02-24 DIAGNOSIS — E039 Hypothyroidism, unspecified: Secondary | ICD-10-CM | POA: Diagnosis not present

## 2022-02-24 DIAGNOSIS — E109 Type 1 diabetes mellitus without complications: Secondary | ICD-10-CM

## 2022-02-24 DIAGNOSIS — Z23 Encounter for immunization: Secondary | ICD-10-CM

## 2022-02-24 DIAGNOSIS — I10 Essential (primary) hypertension: Secondary | ICD-10-CM | POA: Diagnosis not present

## 2022-02-24 DIAGNOSIS — Z79899 Other long term (current) drug therapy: Secondary | ICD-10-CM

## 2022-02-24 DIAGNOSIS — E782 Mixed hyperlipidemia: Secondary | ICD-10-CM

## 2022-02-24 DIAGNOSIS — F411 Generalized anxiety disorder: Secondary | ICD-10-CM

## 2022-02-24 MED ORDER — LISINOPRIL-HYDROCHLOROTHIAZIDE 20-12.5 MG PO TABS
1.0000 | ORAL_TABLET | Freq: Every day | ORAL | 3 refills | Status: DC
Start: 2022-02-24 — End: 2023-03-18

## 2022-02-24 MED ORDER — INSULIN LISPRO (1 UNIT DIAL) 100 UNIT/ML (KWIKPEN): PEN_INJECTOR | SUBCUTANEOUS | 3 refills | Status: DC

## 2022-02-24 MED ORDER — ATORVASTATIN CALCIUM 10 MG PO TABS: 10.0000 mg | ORAL_TABLET | Freq: Every day | ORAL | 3 refills | Status: DC

## 2022-02-24 MED ORDER — TOUJEO MAX SOLOSTAR 300 UNIT/ML ~~LOC~~ SOPN
40.0000 [IU] | PEN_INJECTOR | Freq: Two times a day (BID) | SUBCUTANEOUS | 11 refills | Status: DC
Start: 2022-02-24 — End: 2022-11-30

## 2022-02-24 MED ORDER — ALPRAZOLAM 0.5 MG PO TABS
0.7500 mg | ORAL_TABLET | Freq: Every evening | ORAL | 5 refills | Status: DC | PRN
Start: 2022-02-24 — End: 2022-10-14

## 2022-02-24 MED ORDER — LEVOTHYROXINE SODIUM 25 MCG PO TABS: 25.0000 ug | ORAL_TABLET | Freq: Every day | ORAL | 3 refills | Status: DC

## 2022-02-24 NOTE — Progress Notes (Addendum)
Subjective:  Patient ID: Charles Foley, male    DOB: 1960-12-04  Age: 61 y.o. MRN: [id number]  CC: Medical Management of Chronic Issues   HPI Charles Foley presents for anxiety. Doing well. GAD 7 reviewed.   Patient in for follow-up of GERD. Currently asymptomatic taking  PPI daily. There is no chest pain or heartburn. No hematemesis and no melena. No dysphagia or choking. Onset is remote. Progression is stable. Complicating factors, none.   in for follow-up of elevated cholesterol. Doing well without complaints on current medication. Denies side effects of statin including myalgia and arthralgia and nausea. Currently no chest pain, shortness of breath or other cardiovascular related symptoms noted.  DM & thyroid followed by endo. A1c up recently [employer] denied his GLP med.    09/25/2021    1:39 PM 08/06/2020    8:14 AM 05/02/2020    8:15 AM  GAD 7 : Generalized Anxiety Score  Nervous, Anxious, on Edge 0 0 0  Control/stop worrying 0 0 0  Worry too much - different things 0 0 0  Trouble relaxing 2 2 3   Restless 2 2 3   Easily annoyed or irritable 0 0 0  Afraid - awful might happen 0 0 0  Total GAD 7 Score 4 4 6   Anxiety Difficulty  Somewhat difficult         02/24/2022   11:08 AM 09/25/2021    1:39 PM 08/19/2021    1:37 PM  Depression screen PHQ 2/9  Decreased Interest 0 0 0  Down, Depressed, Hopeless 0 0 0  PHQ - 2 Score 0 0 0  Altered sleeping  0   Tired, decreased energy  0   Change in appetite  0   Feeling bad or failure about yourself   0   Trouble concentrating  0   Moving slowly or fidgety/restless  0   Suicidal thoughts  0   PHQ-9 Score  0     History Charles Foley has a past medical history of Acute pain of right knee (06/17/2017), Allergic rhinitis, Anxiety, Arthritis, Cancer ([place]) (2012), Chest pain (11/02/2014), Diabetes mellitus without complication ([place]), Hypertension, and Hypothyroidism.   He has a past surgical history that includes Prostatectomy (2012);  ORIF facial fracture (1988); Knee arthroscopy (Bilateral); Colonoscopy; and Lumbar laminectomy/decompression microdiscectomy (N/A, 10/11/2020).   His family history includes Diabetes in his mother; Heart disease in his mother.He reports that he quit smoking about 12 years ago. His smoking use included cigarettes. He quit smokeless tobacco use about 19 years ago. He reports that he does not currently use alcohol. He reports that he does not use drugs.    ROS Review of Systems  Constitutional:  Negative for fever.  Respiratory:  Negative for shortness of breath.   Cardiovascular:  Negative for chest pain.  Musculoskeletal:  Negative for arthralgias.  Skin:  Negative for rash.    Objective:  BP 129/70   Pulse (!) 58   Temp 98.6 F (37 C)   Ht 5' 9"  (1.753 m)   Wt 190 lb (86.2 kg)   SpO2 96%   BMI 28.06 kg/m   BP Readings from Last 3 Encounters:  02/24/22 129/70  09/25/21 (!) 144/65  08/19/21 124/67    Wt Readings from Last 3 Encounters:  02/24/22 190 lb (86.2 kg)  09/25/21 193 lb (87.5 kg)  08/19/21 185 lb 6.4 oz (84.1 kg)     Physical Exam Vitals reviewed.  Constitutional:      Appearance: He is  well-developed.  HENT:     Head: Normocephalic and atraumatic.     Right Ear: External ear normal.     Left Ear: External ear normal.     Mouth/Throat:     Pharynx: No oropharyngeal exudate or posterior oropharyngeal erythema.  Eyes:     Pupils: Pupils are equal, round, and reactive to light.  Cardiovascular:     Rate and Rhythm: Normal rate and regular rhythm.     Heart sounds: No murmur heard. Pulmonary:     Effort: No respiratory distress.     Breath sounds: Normal breath sounds.  Musculoskeletal:     Cervical back: Normal range of motion and neck supple.  Neurological:     Mental Status: He is alert and oriented to person, place, and time.       Assessment & Plan:   Charles Foley was seen today for medical management of chronic issues.  Diagnoses and all orders  for this visit:  Mixed hyperlipidemia -     Lipid panel  Type 1 diabetes mellitus without complication (HCC) -     atorvastatin (LIPITOR) 10 MG tablet; Take 1 tablet (10 mg total) by mouth daily. -     insulin lispro (HUMALOG) 100 UNIT/ML KwikPen; INJECT UP TO 20 UNITS 3 TIMES A DAY BEFORE MEALS  Essential hypertension -     CBC with Differential/Platelet -     CMP14+EGFR  Hypothyroidism, unspecified type -     TSH + free T4 -     levothyroxine (SYNTHROID) 25 MCG tablet; Take 1 tablet (25 mcg total) by mouth daily.  GAD (generalized anxiety disorder) -     ALPRAZolam (XANAX) 0.5 MG tablet; Take 1.5 tablets (0.75 mg total) by mouth at bedtime as needed. for sleep  Controlled substance agreement signed -     ToxASSURE Select 13 (MW), Urine  Need for shingles vaccine -     Varicella-zoster vaccine IM (Shingrix)  Other orders -     insulin glargine, 2 Unit Dial, (TOUJEO MAX SOLOSTAR) 300 UNIT/ML Solostar Pen; Inject 40 Units into the skin in the morning and at bedtime. -     lisinopril-hydrochlorothiazide (ZESTORETIC) 20-12.5 MG tablet; Take 1 tablet by mouth daily.       I have discontinued Charles Land "[name]"'s amoxicillin-clavulanate. I have also changed his levothyroxine. Additionally, I am having him maintain his acetaminophen, multivitamin, cetirizine, busPIRone, triamcinolone cream, traZODone, pantoprazole, clobetasol, ALPRAZolam, atorvastatin, insulin lispro, Toujeo Max SoloStar, and lisinopril-hydrochlorothiazide.  Allergies as of 02/24/2022   No Known Allergies      Medication List        Accurate as of February 24, 2022  2:01 PM. If you have any questions, ask your nurse or doctor.          STOP taking these medications    amoxicillin-clavulanate 875-125 MG tablet Commonly known as: AUGMENTIN Stopped by: Claretta Fraise, MD       TAKE these medications    acetaminophen 325 MG tablet Commonly known as: TYLENOL Take 650 mg by mouth every 6 (six) hours as  needed for pain.   ALPRAZolam 0.5 MG tablet Commonly known as: XANAX Take 1.5 tablets (0.75 mg total) by mouth at bedtime as needed. for sleep   atorvastatin 10 MG tablet Commonly known as: LIPITOR Take 1 tablet (10 mg total) by mouth daily.   busPIRone 10 MG tablet Commonly known as: BUSPAR Take 1 tablet (10 mg total) by mouth 3 (three) times daily.   cetirizine 10 MG tablet  Commonly known as: ZYRTEC Take 1 tablet (10 mg total) by mouth daily.   clobetasol 0.05 % external solution Commonly known as: TEMOVATE Apply 1 application topically 2 (two) times daily.   insulin lispro 100 UNIT/ML KwikPen Commonly known as: HUMALOG INJECT UP TO 20 UNITS 3 TIMES A DAY BEFORE MEALS   levothyroxine 25 MCG tablet Commonly known as: SYNTHROID Take 1 tablet (25 mcg total) by mouth daily.   lisinopril-hydrochlorothiazide 20-12.5 MG tablet Commonly known as: Zestoretic Take 1 tablet by mouth daily.   multivitamin tablet Take 1 tablet by mouth daily.   pantoprazole 40 MG tablet Commonly known as: PROTONIX Take 1 tablet (40 mg total) by mouth daily.   Toujeo Max SoloStar 300 UNIT/ML Solostar Pen Generic drug: insulin glargine (2 Unit Dial) Inject 40 Units into the skin in the morning and at bedtime.   traZODone 150 MG tablet Commonly known as: DESYREL Use from 1/3 to 1 tablet nightly as needed for sleep.   triamcinolone cream 0.1 % Commonly known as: KENALOG APPLY 1 APPLICATION TOPICALLY 2 TIMES DAILY         Follow-up: Return in about 6 months (around 08/26/2022).  Claretta Fraise, M.D.

## 2022-02-25 LAB — CMP14+EGFR
ALT: 23 IU/L (ref 0–44)
AST: 21 IU/L (ref 0–40)
Albumin/Globulin Ratio: 1.4 (ref 1.2–2.2)
Albumin: 4.3 g/dL (ref 3.8–4.8)
Alkaline Phosphatase: 53 IU/L (ref 44–121)
BUN/Creatinine Ratio: 21 (ref 10–24)
BUN: 18 mg/dL (ref 8–27)
Bilirubin Total: 0.6 mg/dL (ref 0.0–1.2)
CO2: 23 mmol/L (ref 20–29)
Calcium: 9.4 mg/dL (ref 8.6–10.2)
Chloride: 102 mmol/L (ref 96–106)
Creatinine, Ser: 0.84 mg/dL (ref 0.76–1.27)
Globulin, Total: 3 g/dL (ref 1.5–4.5)
Glucose: 165 mg/dL — ABNORMAL HIGH (ref 70–99)
Potassium: 4.7 mmol/L (ref 3.5–5.2)
Sodium: 138 mmol/L (ref 134–144)
Total Protein: 7.3 g/dL (ref 6.0–8.5)
eGFR: 98 mL/min/{1.73_m2} (ref >59)

## 2022-02-25 LAB — CBC WITH DIFFERENTIAL/PLATELET
Basophils Absolute: 0.1 10*3/uL (ref 0.0–0.2)
Basos: 1 %
EOS (ABSOLUTE): 0.3 10*3/uL (ref 0.0–0.4)
Eos: 4 %
Hematocrit: 39.2 % (ref 37.5–51.0)
Hemoglobin: 13.3 g/dL (ref 13.0–17.7)
Immature Grans (Abs): 0 10*3/uL (ref 0.0–0.1)
Immature Granulocytes: 0 %
Lymphocytes Absolute: 2.7 10*3/uL (ref 0.7–3.1)
Lymphs: 36 %
MCH: 31.4 pg (ref 26.6–33.0)
MCHC: 33.9 g/dL (ref 31.5–35.7)
MCV: 93 fL (ref 79–97)
Monocytes Absolute: 0.6 10*3/uL (ref 0.1–0.9)
Monocytes: 7 %
Neutrophils Absolute: 3.9 10*3/uL (ref 1.4–7.0)
Neutrophils: 52 %
Platelets: 265 10*3/uL (ref 150–450)
RBC: 4.23 x10E6/uL (ref 4.14–5.80)
RDW: 12.2 % (ref 11.6–15.4)
WBC: 7.5 10*3/uL (ref 3.4–10.8)

## 2022-02-25 LAB — TSH+FREE T4
Free T4: 1.34 ng/dL (ref 0.82–1.77)
TSH: 2.85 u[IU]/mL (ref 0.450–4.500)

## 2022-02-25 LAB — LIPID PANEL
Chol/HDL Ratio: 2.2 ratio (ref 0.0–5.0)
Cholesterol, Total: 132 mg/dL (ref 100–199)
HDL: 61 mg/dL (ref >39)
LDL Chol Calc (NIH): 59 mg/dL (ref 0–99)
Triglycerides: 53 mg/dL (ref 0–149)
VLDL Cholesterol Cal: 12 mg/dL (ref 5–40)

## 2022-02-25 NOTE — Progress Notes (Signed)
Hello Carmichael,  Your lab result is normal and/or stable.Some minor variations that are not significant are commonly marked abnormal, but do not represent any medical problem for you.  Best regards, Josetta Wigal, M.D.

## 2022-02-27 LAB — TOXASSURE SELECT 13 (MW), URINE

## 2022-03-09 ENCOUNTER — Other Ambulatory Visit: Payer: Self-pay

## 2022-03-09 ENCOUNTER — Emergency Department (HOSPITAL_COMMUNITY)
Admission: EM | Admit: 2022-03-09 | Discharge: 2022-03-09 | Disposition: A | Discharge status: Home / Self Care | Payer: 59 | Attending: Emergency Medicine | Admitting: Emergency Medicine

## 2022-03-09 ENCOUNTER — Emergency Department (HOSPITAL_COMMUNITY): Payer: 59

## 2022-03-09 ENCOUNTER — Encounter (HOSPITAL_COMMUNITY): Payer: Self-pay | Admitting: Emergency Medicine

## 2022-03-09 DIAGNOSIS — Z91040 Latex allergy status: Secondary | ICD-10-CM | POA: Diagnosis not present

## 2022-03-09 DIAGNOSIS — M545 Low back pain, unspecified: Secondary | ICD-10-CM | POA: Diagnosis present

## 2022-03-09 DIAGNOSIS — Z79899 Other long term (current) drug therapy: Secondary | ICD-10-CM | POA: Diagnosis not present

## 2022-03-09 DIAGNOSIS — M5441 Lumbago with sciatica, right side: Secondary | ICD-10-CM | POA: Insufficient documentation

## 2022-03-09 DIAGNOSIS — Z794 Long term (current) use of insulin: Secondary | ICD-10-CM | POA: Diagnosis not present

## 2022-03-09 MED ORDER — METHOCARBAMOL 500 MG PO TABS: 500.0000 mg | ORAL_TABLET | Freq: Three times a day (TID) | ORAL | 0 refills | Status: DC

## 2022-03-09 MED ORDER — KETOROLAC TROMETHAMINE 60 MG/2ML IM SOLN
60.0000 mg | Freq: Once | INTRAMUSCULAR | Status: AC
Start: 2022-03-09 — End: 2022-03-09
  Administered 2022-03-09: 60 mg via INTRAMUSCULAR
  Filled 2022-03-09: qty 2

## 2022-03-09 MED ORDER — DIAZEPAM 5 MG PO TABS
5.0000 mg | ORAL_TABLET | Freq: Once | ORAL | Status: AC
Start: 2022-03-09 — End: 2022-03-09
  Administered 2022-03-09: 5 mg via ORAL
  Filled 2022-03-09: qty 1

## 2022-03-09 MED ORDER — HYDROCODONE-ACETAMINOPHEN 5-325 MG PO TABS: ORAL_TABLET | ORAL | 0 refills | Status: DC

## 2022-03-09 MED ORDER — HYDROCODONE-ACETAMINOPHEN 5-325 MG PO TABS: 2.0000 | ORAL_TABLET | ORAL | 0 refills | Status: DC | PRN

## 2022-03-09 MED ORDER — IBUPROFEN 800 MG PO TABS: 800.0000 mg | ORAL_TABLET | Freq: Three times a day (TID) | ORAL | 0 refills | Status: DC

## 2022-03-09 NOTE — ED Notes (Signed)
Vicodin 5/325 pak pulled from [employer]; witnessed by Claiborne Billings, Therapist, sports. Patient signed Rx and Rx placed in pharmacy tub next to ED pyxis.

## 2022-03-09 NOTE — ED Triage Notes (Signed)
Pt states picked something up at work Monday and c/o right lower back pain radiating down right leg to knee since. Pt has mild limp with ambulation.

## 2022-03-09 NOTE — Discharge Instructions (Signed)
Alternate ice and heat to your lower back.  Avoid twisting bending or heavy lifting for at least 1 week.  Take the medication as directed.  Follow-up with your primary care provider or with your orthopedic provider for recheck in 1 week if not improving.

## 2022-03-09 NOTE — ED Notes (Signed)
Pt sugar 186 per libre 3

## 2022-03-09 NOTE — ED Notes (Signed)
Patient transported to X-ray 

## 2022-03-09 NOTE — ED Notes (Addendum)
See triage notes. Nad. Pt denies this being workers comp

## 2022-03-09 NOTE — ED Notes (Signed)
Patient returned to room from X-ray 

## 2022-03-10 MED FILL — Hydrocodone-Acetaminophen Tab 5-325 MG: ORAL | Qty: 6 | Status: AC

## 2022-03-11 NOTE — ED Provider Notes (Signed)
[facility] EMERGENCY DEPARTMENT Provider Note   CSN: [id number] Arrival date & time: 03/09/22  1558     History  Chief Complaint  Patient presents with   Back Pain    Charles Foley is a 61 y.o. male.   Back Pain Associated symptoms: no fever, no numbness and no weakness        Charles Foley is a 61 y.o. male who presents to the Emergency Department complaining of right-sided low back pain and right thigh pain.  Symptoms began 6 days ago after he bent over to pick up something at work.  He felt a sharp pain into his back that radiated into his buttock and into his right thigh.  He states pain has not improved since onset.  He is having difficulty standing upright due to pain.  Pain resolves while at rest.  He has tried over-the-counter pain relievers without improvement.  He denies fever, chills, abdominal pain, urine or bowel changes, numbness or weakness of his lower extremity.  No pain or numbness of the perineum.  Home Medications Prior to Admission medications   Medication Sig Start Date End Date Taking? Authorizing Provider  HYDROcodone-acetaminophen (NORCO/VICODIN) 5-325 MG tablet Take one tab po q 4 hrs prn pain 03/09/22  Yes Easter Schinke, PA-C  HYDROcodone-acetaminophen (NORCO/VICODIN) 5-325 MG tablet Take 2 tablets by mouth every 4 (four) hours as needed. 03/09/22  Yes Elexus Barman, PA-C  ibuprofen (ADVIL) 800 MG tablet Take 1 tablet (800 mg total) by mouth 3 (three) times daily. Take with food 03/09/22  Yes Mertis Mosher, PA-C  methocarbamol (ROBAXIN) 500 MG tablet Take 1 tablet (500 mg total) by mouth 3 (three) times daily. 03/09/22  Yes Raeanne Deschler, PA-C  acetaminophen (TYLENOL) 325 MG tablet Take 650 mg by mouth every 6 (six) hours as needed for pain.    [provider]  ALPRAZolam Duanne Moron) 0.5 MG tablet Take 1.5 tablets (0.75 mg total) by mouth at bedtime as needed. for sleep 02/24/22   Claretta Fraise, MD  atorvastatin (LIPITOR) 10 MG tablet Take 1 tablet  (10 mg total) by mouth daily. 02/24/22   Claretta Fraise, MD  busPIRone (BUSPAR) 10 MG tablet Take 1 tablet (10 mg total) by mouth 3 (three) times daily. 08/06/20   Claretta Fraise, MD  cetirizine (ZYRTEC) 10 MG tablet Take 1 tablet (10 mg total) by mouth daily. 01/31/20   Claretta Fraise, MD  clobetasol (TEMOVATE) 0.05 % external solution Apply 1 application topically 2 (two) times daily. 08/19/21   Claretta Fraise, MD  insulin glargine, 2 Unit Dial, (TOUJEO MAX SOLOSTAR) 300 UNIT/ML Solostar Pen Inject 40 Units into the skin in the morning and at bedtime. 02/24/22   Claretta Fraise, MD  insulin lispro (HUMALOG) 100 UNIT/ML KwikPen INJECT UP TO 20 UNITS 3 TIMES A DAY BEFORE MEALS 02/24/22   Claretta Fraise, MD  levothyroxine (SYNTHROID) 25 MCG tablet Take 1 tablet (25 mcg total) by mouth daily. 02/24/22   Claretta Fraise, MD  lisinopril-hydrochlorothiazide (ZESTORETIC) 20-12.5 MG tablet Take 1 tablet by mouth daily. 02/24/22   Claretta Fraise, MD  Multiple Vitamin (MULTIVITAMIN) tablet Take 1 tablet by mouth daily.    [provider]  pantoprazole (PROTONIX) 40 MG tablet Take 1 tablet (40 mg total) by mouth daily. 08/19/21   Claretta Fraise, MD  traZODone (DESYREL) 150 MG tablet Use from 1/3 to 1 tablet nightly as needed for sleep. 08/19/21   Claretta Fraise, MD  triamcinolone cream (KENALOG) 0.1 % APPLY 1 APPLICATION TOPICALLY 2 TIMES  DAILY 01/22/21   Loman Brooklyn, FNP      Allergies    Latex    Review of Systems   Review of Systems  Constitutional:  Negative for chills and fever.  Genitourinary:  Negative for decreased urine volume, difficulty urinating, flank pain, frequency, penile discharge, penile pain, penile swelling, scrotal swelling and testicular pain.  Musculoskeletal:  Positive for back pain.  Skin:  Negative for color change.  Neurological:  Negative for dizziness, weakness and numbness.    Physical Exam Updated Vital Signs BP (!) 149/73 (BP Location: Right Arm)   Pulse 62   Temp  98.2 F (36.8 C) (Oral)   Resp 18   SpO2 99%  Physical Exam Vitals and nursing note reviewed.  Constitutional:      General: He is not in acute distress.    Appearance: Normal appearance.  Cardiovascular:     Rate and Rhythm: Normal rate and regular rhythm.     Pulses: Normal pulses.  Pulmonary:     Effort: Pulmonary effort is normal.     Breath sounds: Normal breath sounds.  Abdominal:     Palpations: Abdomen is soft.     Tenderness: There is no abdominal tenderness.  Musculoskeletal:        General: No swelling.     Comments: Patient has tenderness to palpation of the lower right lumbar paraspinal muscles and right SI joint space.  Hip flexors and extensors are intact.  No pain with range of motion of the right hip.  Skin:    General: Skin is warm.     Capillary Refill: Capillary refill takes less than 2 seconds.     Findings: No rash.  Neurological:     General: No focal deficit present.     Mental Status: He is alert.     Sensory: No sensory deficit.     Motor: No weakness.     ED Results / Procedures / Treatments   Labs (all labs ordered are listed, but only abnormal results are displayed) Labs Reviewed - No data to display  EKG None  Radiology DG Lumbar Spine Complete  Result Date: 03/09/2022 CLINICAL DATA:  Right lower back pain radiating down the right leg after injury on Monday. EXAM: LUMBAR SPINE - COMPLETE 4+ VIEW COMPARISON:  10/11/2020 and 06/11/2020 FINDINGS: Five lumbar type vertebral bodies. Normal alignment. No vertebral compression deformities. Postoperative change at L4. Degenerative changes are present with narrowed interspaces and endplate osteophyte formation. Degenerative disc disease at L2-3. No significant progression since previous study. No focal bone lesion or bone destruction. Visualized sacrum appears intact. Vascular calcification in the aorta. IMPRESSION: Mild degenerative changes in the lumbar spine. Normal alignment. No acute displaced  fractures are identified. Electronically Signed   By: Lucienne Capers M.D.   On: 03/09/2022 20:08    Procedures Procedures    Medications Ordered in ED Medications  diazepam (VALIUM) tablet 5 mg (5 mg Oral Given 03/09/22 2005)  ketorolac (TORADOL) injection 60 mg (60 mg Intramuscular Given 03/09/22 2005)    ED Course/ Medical Decision Making/ A&P                           Medical Decision Making Amount and/or Complexity of Data Reviewed Radiology: ordered.  Risk Prescription drug management.   Patient here for evaluation of right-sided low back pain that occurred after bending over to pick something up at work.  Occurred several days ago.  He is  having pain of the right lower back that radiates to the level of his thigh.  On exam, he has muscular tenderness of the lumbar paraspinal region and right SI joint space. He is ambulatory with a slow but steady gait.  No focal neurodeficits on my exam, no red flags to suggest cauda equina.  No recent procedures to indicate spinal abscess.  Symptoms likely related to sciatica.  I feel the patient is appropriate for discharge home, will treat with short course of pain medication and muscle relaxer.  He will follow-up with PCP.  Return precautions were discussed.  dococument critical care time when appropriate:1}      Final Clinical Impression(s) / ED Diagnoses Final diagnoses:  Acute right-sided low back pain with right-sided sciatica    Rx / DC Orders ED Discharge Orders          Ordered    HYDROcodone-acetaminophen (NORCO/VICODIN) 5-325 MG tablet        03/09/22 2135    methocarbamol (ROBAXIN) 500 MG tablet  3 times daily        03/09/22 2135    ibuprofen (ADVIL) 800 MG tablet  3 times daily        03/09/22 2135    HYDROcodone-acetaminophen (NORCO/VICODIN) 5-325 MG tablet  Every 4 hours PRN        03/09/22 2136              Kem Parkinson, PA-C 03/11/22 1430    Fredia Sorrow, MD 03/22/22 2324

## 2022-03-13 ENCOUNTER — Ambulatory Visit: Payer: 59 | Admitting: Family Medicine

## 2022-03-13 ENCOUNTER — Encounter: Payer: Self-pay | Admitting: Family Medicine

## 2022-03-13 VITALS — BP 161/68 | HR 73 | Temp 98.0°F | Ht 69.0 in | Wt 189.0 lb

## 2022-03-13 DIAGNOSIS — M5441 Lumbago with sciatica, right side: Secondary | ICD-10-CM | POA: Diagnosis not present

## 2022-03-13 DIAGNOSIS — M5416 Radiculopathy, lumbar region: Secondary | ICD-10-CM | POA: Diagnosis not present

## 2022-03-13 DIAGNOSIS — B029 Zoster without complications: Secondary | ICD-10-CM

## 2022-03-13 MED ORDER — CYCLOBENZAPRINE HCL 10 MG PO TABS: 10.0000 mg | ORAL_TABLET | Freq: Three times a day (TID) | ORAL | 0 refills | Status: DC | PRN

## 2022-03-13 MED ORDER — KETOROLAC TROMETHAMINE 60 MG/2ML IM SOLN
60.0000 mg | Freq: Once | INTRAMUSCULAR | Status: AC
  Administered 2022-03-13: 60 mg via INTRAMUSCULAR

## 2022-03-13 MED ORDER — VALACYCLOVIR HCL 1 G PO TABS: 1000.0000 mg | ORAL_TABLET | Freq: Three times a day (TID) | ORAL | 0 refills | Status: AC

## 2022-03-13 NOTE — Progress Notes (Signed)
BP (!) 161/68   Pulse 73   Temp 98 F (36.7 C)   Ht '5\' 9"'$  (1.753 m)   Wt 189 lb (85.7 kg)   SpO2 97%   BMI 27.91 kg/m    Subjective:   Patient ID: Charles Foley, male    DOB: 09/11/1961, 61 y.o.   MRN: [id number]  HPI: Charles Foley is a 61 y.o. male presenting on 03/13/2022 for Back Pain (Radiates down right leg. Pain with standing.)   HPI Back pain Patient was in the emergency department on 03/09/2022 for right-sided low back pain.  He says symptoms started a little over a week ago after he bent over to pick something up at work and that he felt a sharp pain in his back that radiated to his right buttock and his right thigh.  He tried some over-the-counter pain relievers without improvement.  Pain resolves at rest and is worse with standing.  In the emergency department he was given an injection of Valium and Toradol and then given a prescription of hydrocodone to go home with.  Patient has difficulty standing fully upright because of it.  Relevant past medical, surgical, family and social history reviewed and updated as indicated. Interim medical history since our last visit reviewed. Allergies and medications reviewed and updated.  Review of Systems  Constitutional:  Negative for chills and fever.  Respiratory:  Negative for shortness of breath and wheezing.   Cardiovascular:  Negative for chest pain and leg swelling.  Musculoskeletal:  Positive for arthralgias, back pain, gait problem and myalgias.  Skin:  Negative for rash.  Neurological:  Positive for weakness and numbness.  All other systems reviewed and are negative.   Per HPI unless specifically indicated above   Allergies as of 03/13/2022       Reactions   Latex         Medication List        Accurate as of March 13, 2022  9:38 AM. If you have any questions, ask your nurse or doctor.          STOP taking these medications    busPIRone 10 MG tablet Commonly known as: BUSPAR Stopped by: Fransisca Kaufmann  Vasily Fedewa, MD       TAKE these medications    acetaminophen 325 MG tablet Commonly known as: TYLENOL Take 650 mg by mouth every 6 (six) hours as needed for pain.   ALPRAZolam 0.5 MG tablet Commonly known as: XANAX Take 1.5 tablets (0.75 mg total) by mouth at bedtime as needed. for sleep   atorvastatin 10 MG tablet Commonly known as: LIPITOR Take 1 tablet (10 mg total) by mouth daily.   cetirizine 10 MG tablet Commonly known as: ZYRTEC Take 1 tablet (10 mg total) by mouth daily.   clobetasol 0.05 % external solution Commonly known as: TEMOVATE Apply 1 application topically 2 (two) times daily.   HYDROcodone-acetaminophen 5-325 MG tablet Commonly known as: NORCO/VICODIN Take one tab po q 4 hrs prn pain   HYDROcodone-acetaminophen 5-325 MG tablet Commonly known as: NORCO/VICODIN Take 2 tablets by mouth every 4 (four) hours as needed.   ibuprofen 800 MG tablet Commonly known as: ADVIL Take 1 tablet (800 mg total) by mouth 3 (three) times daily. Take with food   insulin lispro 100 UNIT/ML KwikPen Commonly known as: HUMALOG INJECT UP TO 20 UNITS 3 TIMES A DAY BEFORE MEALS   levothyroxine 25 MCG tablet Commonly known as: SYNTHROID Take 1 tablet (25 mcg total) by mouth  daily.   lisinopril-hydrochlorothiazide 20-12.5 MG tablet Commonly known as: Zestoretic Take 1 tablet by mouth daily.   methocarbamol 500 MG tablet Commonly known as: ROBAXIN Take 1 tablet (500 mg total) by mouth 3 (three) times daily.   multivitamin tablet Take 1 tablet by mouth daily.   pantoprazole 40 MG tablet Commonly known as: PROTONIX Take 1 tablet (40 mg total) by mouth daily.   Toujeo Max SoloStar 300 UNIT/ML Solostar Pen Generic drug: insulin glargine (2 Unit Dial) Inject 40 Units into the skin in the morning and at bedtime.   traZODone 150 MG tablet Commonly known as: DESYREL Use from 1/3 to 1 tablet nightly as needed for sleep.   triamcinolone cream 0.1 % Commonly known as:  KENALOG APPLY 1 APPLICATION TOPICALLY 2 TIMES DAILY         Objective:   BP (!) 161/68   Pulse 73   Temp 98 F (36.7 C)   Ht '5\' 9"'$  (1.753 m)   Wt 189 lb (85.7 kg)   SpO2 97%   BMI 27.91 kg/m   Wt Readings from Last 3 Encounters:  03/13/22 189 lb (85.7 kg)  02/24/22 190 lb (86.2 kg)  09/25/21 193 lb (87.5 kg)    Physical Exam Vitals and nursing note reviewed.  Constitutional:      General: He is not in acute distress.    Appearance: He is well-developed. He is not diaphoretic.  Eyes:     General: No scleral icterus.    Conjunctiva/sclera: Conjunctivae normal.  Musculoskeletal:     Lumbar back: Tenderness present. No deformity or bony tenderness. Decreased range of motion (Stiffness because of pain, unable to fully stand). Positive right straight leg raise test. Negative left straight leg raise test.       Back:  Skin:    General: Skin is warm and dry.     Findings: No rash.  Neurological:     Mental Status: He is alert and oriented to person, place, and time.     Coordination: Coordination normal.  Psychiatric:        Behavior: Behavior normal.       Assessment & Plan:   Problem List Items Addressed This Visit   None Visit Diagnoses     Acute right-sided low back pain with right-sided sciatica    -  Primary   Relevant Medications   ketorolac (TORADOL) injection 60 mg (Start on 03/13/2022  9:45 AM)   Other Relevant Orders   MR Lumbar Spine Wo Contrast   Ambulatory referral to Physical Therapy   Lumbar nerve root impingement       Relevant Medications   ketorolac (TORADOL) injection 60 mg (Start on 03/13/2022  9:45 AM)   Other Relevant Orders   MR Lumbar Spine Wo Contrast   Ambulatory referral to Physical Therapy       Number this and weakness down the right leg, not improved with Toradol from the emergency department.  We will try for MRI and recommended he call his back doctor back up. Follow up plan: Return in about 6 weeks (around 04/24/2022), or  if symptoms worsen or fail to improve, for Recheck low back pain if MRI not approved.  Counseling provided for all of the vaccine components Orders Placed This Encounter  Procedures   MR Lumbar Spine Wo Contrast   Ambulatory referral to Physical Therapy    Caryl Pina, MD [facility] 03/13/2022, 9:38 AM

## 2022-03-13 NOTE — Addendum Note (Signed)
Addended by: Caryl Pina on: 03/13/2022 09:43 AM   Modules accepted: Orders

## 2022-03-18 ENCOUNTER — Telehealth: Payer: Self-pay | Admitting: Family Medicine

## 2022-03-19 ENCOUNTER — Encounter: Payer: Self-pay | Admitting: Family Medicine

## 2022-03-19 ENCOUNTER — Telehealth: Payer: Self-pay | Admitting: Family Medicine

## 2022-03-19 NOTE — Telephone Encounter (Signed)
Patient calling because he is unable to go back to work due to continued back pain. He would like to be written another note to be out until 7/10 - please call him back to let him know if this is possible.

## 2022-03-20 NOTE — Telephone Encounter (Signed)
Pt has apt for MRI on 03/31/2022. He wants an updated work note to go back after MRI results come back. PT aware Stacks is off. Please call back

## 2022-03-21 ENCOUNTER — Other Ambulatory Visit: Payer: Self-pay | Admitting: Family Medicine

## 2022-03-21 DIAGNOSIS — M5416 Radiculopathy, lumbar region: Secondary | ICD-10-CM

## 2022-03-21 DIAGNOSIS — M5441 Lumbago with sciatica, right side: Secondary | ICD-10-CM

## 2022-03-21 NOTE — Telephone Encounter (Signed)
PLease write for himm to return on July 11.

## 2022-03-21 NOTE — Telephone Encounter (Signed)
Note placed at front for pick up, patient aware.

## 2022-03-31 ENCOUNTER — Ambulatory Visit (HOSPITAL_COMMUNITY)
Admission: RE | Admit: 2022-03-31 | Discharge: 2022-03-31 | Disposition: A | Discharge status: Home / Self Care | Payer: 59 | Source: Ambulatory Visit | Attending: Family Medicine | Admitting: Family Medicine

## 2022-03-31 DIAGNOSIS — M5416 Radiculopathy, lumbar region: Secondary | ICD-10-CM | POA: Diagnosis present

## 2022-03-31 DIAGNOSIS — M5441 Lumbago with sciatica, right side: Secondary | ICD-10-CM | POA: Diagnosis not present

## 2022-04-03 ENCOUNTER — Telehealth: Payer: Self-pay | Admitting: Family Medicine

## 2022-04-04 ENCOUNTER — Other Ambulatory Visit: Payer: Self-pay

## 2022-04-04 ENCOUNTER — Other Ambulatory Visit: Payer: Self-pay | Admitting: Family Medicine

## 2022-04-04 DIAGNOSIS — T148XXA Other injury of unspecified body region, initial encounter: Secondary | ICD-10-CM

## 2022-04-04 DIAGNOSIS — M5136 Other intervertebral disc degeneration, lumbar region: Secondary | ICD-10-CM

## 2022-04-04 MED ORDER — GABAPENTIN 100 MG PO CAPS: 100.0000 mg | ORAL_CAPSULE | Freq: Three times a day (TID) | ORAL | 3 refills | Status: DC

## 2022-04-04 NOTE — Progress Notes (Signed)
Patient calling to check on results. Please call back  

## 2022-04-15 ENCOUNTER — Telehealth: Payer: Self-pay | Admitting: Family Medicine

## 2022-04-16 NOTE — Telephone Encounter (Signed)
Informed pt that his ppw was faxed on 04/11/22

## 2022-04-22 ENCOUNTER — Other Ambulatory Visit: Payer: Self-pay

## 2022-04-22 ENCOUNTER — Ambulatory Visit: Payer: 59 | Attending: Neurosurgery

## 2022-04-22 DIAGNOSIS — M5416 Radiculopathy, lumbar region: Secondary | ICD-10-CM | POA: Diagnosis present

## 2022-04-22 NOTE — Therapy (Signed)
OUTPATIENT PHYSICAL THERAPY THORACOLUMBAR EVALUATION   Patient Name: Charles Foley MRN: [id number] DOB:Mar 27, 1961, 61 y.o., male Today's Date: 04/22/2022   PT End of Session - 04/22/22 0904     Visit Number 1    Number of Visits 8    Date for PT Re-Evaluation 06/20/22    PT Start Time 0902    PT Stop Time 0942    PT Time Calculation (min) 40 min    Activity Tolerance Patient limited by pain    Behavior During Therapy [facility] for tasks assessed/performed             Past Medical History:  Diagnosis Date   Acute pain of right knee 06/17/2017   Allergic rhinitis    Anxiety    Arthritis    Cancer ([place]) 2012   prostate   Chest pain 11/02/2014   atypical, no cardiac testing ordered per Dorris Carnes, MD 11/03/14   Diabetes mellitus without complication ([place])    type II   Hypertension    Hypothyroidism    Past Surgical History:  Procedure Laterality Date   COLONOSCOPY     KNEE ARTHROSCOPY Bilateral    LUMBAR LAMINECTOMY/DECOMPRESSION MICRODISCECTOMY N/A 10/11/2020   Procedure: LUMBAR FOUR -LUMBAR FIVE DECOMPRESSION;  Surgeon: Phylliss Bob, MD;  Location: [facility];  Service: Orthopedics;  Laterality: N/A;  posterior   ORIF FACIAL FRACTURE  1988   PROSTATECTOMY  2012   Patient Active Problem List   Diagnosis Date Noted   Acute non-recurrent maxillary sinusitis 01/28/2021   Callus of foot 03/30/2019   Seborrhea 03/30/2019   GAD (generalized anxiety disorder) 10/01/2018   Hypothyroidism 10/01/2018   Rotator cuff tear arthropathy of right shoulder 04/29/2017   Essential hypertension 03/17/2017   Type 1 diabetes mellitus without complication ([place]) 42/68/3419   Personal history of prostate cancer 08/12/2016   Chronic allergic rhinitis due to pollen 08/12/2016   Degenerative joint disease involving multiple joints 08/12/2016    PCP: Claretta Fraise, MD  REFERRING PROVIDER: Vallarie Mare, MD  REFERRING DIAG: Radiculopathy, lumbar region  Rationale for Evaluation and  Treatment Rehabilitation  THERAPY DIAG:  Radiculopathy, lumbar region  ONSET DATE: 5-6 weeks ago   SUBJECTIVE:                                                                                                                                                                                           SUBJECTIVE STATEMENT: He notes that his back has been bothering him for about 5-6 weeks. He has been having numbness and pain radiating down his right leg. He notes that this is the worst pain he has every had.  He has begun to notice that that his left leg has begun to bother him. He feels that his pain has steadily gotten worse since it first began.  PERTINENT HISTORY:  Prior lumbar surgery, HTN, DM, history of cancer (prostate)   PAIN:  Are you having pain? Yes: NPRS scale: 8-9/10 Pain location: back radiating down RLE Pain description: throbbing and numbness Aggravating factors: walking (<5 minutes), standing, bending Relieving factors: sitting, heat,    PRECAUTIONS: None  WEIGHT BEARING RESTRICTIONS No  FALLS:  Has patient fallen in last 6 months? No  LIVING ENVIRONMENT: Lives with: lives with their family Lives in: House/apartment Stairs: Yes: External: 3 steps; on right going up Has following equipment at home: Single point cane  OCCUPATION: not working currently  PLOF: Independent  NEXT MD FOLLOW UP: 05/13/22  PATIENT GOALS: reduced pain, stand and walk longer, return to work    OBJECTIVE:   DIAGNOSTIC FINDINGS: MRI on 03/31/22 Lumbar spondylosis and degenerative disc disease, causing moderate to prominent impingement at L4-5 primarily due to a right lateral recess disc protrusion in conjunction with intervertebral and facet spurring and disc bulge.  SCREENING FOR RED FLAGS: Bowel or bladder incontinence: No, but he feels like he is constipated Spinal tumors: No Cauda equina syndrome: No Compression fracture: No Abdominal aneurysm: No  COGNITION:  Overall  cognitive status: Within functional limits for tasks assessed     SENSATION: Light touch: Impaired  and diminished sensation throughout RLE with no dermatomal pattern  POSTURE: rounded shoulders, forward head, decreased lumbar lordosis, and increased thoracic kyphosis  PALPATION: assessed in sitting due to positional intolerance TTP: Lumbar paraspinal and QL   LUMBAR ROM: unable to be safely assessed at this time due to pain and instability with standing  LOWER EXTREMITY MMT:    MMT Right eval Left eval  Hip flexion 4-/5 4/5  Hip extension    Hip abduction    Hip adduction    Hip internal rotation    Hip external rotation    Knee flexion 4-/5 4/5  Knee extension 3/5 with right leg pain  3+/5  Ankle dorsiflexion 3/5 3+/5  Ankle plantarflexion    Ankle inversion    Ankle eversion     (Blank rows = not tested)  LUMBAR SPECIAL TESTS:  Slump test: Positive  GAIT: Assistive device utilized: None Level of assistance: CGA Comments: lumbar flexion, decreased gait speed, absent hip extension, and required external support with ambulation    TODAY'S TREATMENT     PATIENT EDUCATION:  Education details: prognosis, healing, POC Person educated: Patient Education method: Explanation Education comprehension: verbalized understanding   HOME EXERCISE PROGRAM:   ASSESSMENT:  CLINICAL IMPRESSION: Patient is a 61 y.o. male who was seen today for physical therapy evaluation and treatment for right lumbar radiculopathy. He presented with high pain severity and irritability with palpation to his lumbar musculature. He also exhibited a positive slump test. Lumbar AROM was unable to be safely assessed due to instability with standing. Recommend that he continue with skilled physical therapy to address his remaining impairments to maximize his functional mobility.     OBJECTIVE IMPAIRMENTS Abnormal gait, decreased activity tolerance, decreased balance, decreased mobility, difficulty  walking, decreased ROM, decreased strength, hypomobility, impaired sensation, and pain.   ACTIVITY LIMITATIONS carrying, lifting, bending, sitting, standing, transfers, and locomotion level  PARTICIPATION LIMITATIONS: shopping and community activity  PERSONAL FACTORS Time since onset of injury/illness/exacerbation and 3+ comorbidities: Prior lumbar surgery, HTN, DM, history of cancer (prostate)   are  also affecting patient's functional outcome.   REHAB POTENTIAL: Fair    CLINICAL DECISION MAKING: Unstable/unpredictable  EVALUATION COMPLEXITY: High   GOALS: Goals reviewed with patient? Yes  LONG TERM GOALS: Target date: 05/20/2022  Patient will be independent with his HEP.  Baseline:  Goal status: INITIAL  2.  Patient will be able to complete his daily activities with his familiar pain exceeding 6/10. Baseline:  Goal status: INITIAL  3.  Patient will be able to stand and walk for at least 15 minutes without being limited by his familiar pain.  Baseline:  Goal status: INITIAL  4.  Patient will improve his RLE manual muscle testing to at least equal to his LLE for improved function with his daily activities.  Baseline:  Goal status: INITIAL   PLAN: PT FREQUENCY: 2x/week  PT DURATION: 4 weeks  PLANNED INTERVENTIONS: Therapeutic exercises, Therapeutic activity, Neuromuscular re-education, Balance training, Gait training, Patient/Family education, Self Care, Joint mobilization, Electrical stimulation, Spinal mobilization, Cryotherapy, Moist heat, Manual therapy, and Re-evaluation.  PLAN FOR NEXT SESSION: nustep, lumbar stability, light AROM, and modalities as needed    Darlin Coco, PT 04/22/2022, 6:53 PM

## 2022-04-24 ENCOUNTER — Ambulatory Visit: Payer: 59

## 2022-04-24 ENCOUNTER — Encounter: Payer: Self-pay | Admitting: Family Medicine

## 2022-04-24 ENCOUNTER — Ambulatory Visit (INDEPENDENT_AMBULATORY_CARE_PROVIDER_SITE_OTHER): Payer: 59 | Admitting: Family Medicine

## 2022-04-24 DIAGNOSIS — T148XXA Other injury of unspecified body region, initial encounter: Secondary | ICD-10-CM

## 2022-04-24 DIAGNOSIS — M5136 Other intervertebral disc degeneration, lumbar region: Secondary | ICD-10-CM

## 2022-04-24 DIAGNOSIS — M5416 Radiculopathy, lumbar region: Secondary | ICD-10-CM | POA: Diagnosis not present

## 2022-04-24 DIAGNOSIS — M5459 Other low back pain: Secondary | ICD-10-CM | POA: Diagnosis not present

## 2022-04-24 MED ORDER — GABAPENTIN 300 MG PO CAPS: 300.0000 mg | ORAL_CAPSULE | Freq: Three times a day (TID) | ORAL | 1 refills | Status: DC

## 2022-04-24 NOTE — Therapy (Signed)
OUTPATIENT PHYSICAL THERAPY THORACOLUMBAR TREATMENT   Patient Name: Charles Foley MRN: [id number] DOB:09-28-1960, 61 y.o., male Today's Date: 04/24/2022   PT End of Session - 04/24/22 0725     Visit Number 2    Number of Visits 8    Date for PT Re-Evaluation 06/20/22    PT Start Time 0730    PT Stop Time 0818    PT Time Calculation (min) 48 min    Activity Tolerance Patient limited by pain    Behavior During Therapy [facility] for tasks assessed/performed              Past Medical History:  Diagnosis Date   Acute pain of right knee 06/17/2017   Allergic rhinitis    Anxiety    Arthritis    Cancer ([place]) 2012   prostate   Chest pain 11/02/2014   atypical, no cardiac testing ordered per Dorris Carnes, MD 11/03/14   Diabetes mellitus without complication ([place])    type II   Hypertension    Hypothyroidism    Past Surgical History:  Procedure Laterality Date   COLONOSCOPY     KNEE ARTHROSCOPY Bilateral    LUMBAR LAMINECTOMY/DECOMPRESSION MICRODISCECTOMY N/A 10/11/2020   Procedure: LUMBAR FOUR -LUMBAR FIVE DECOMPRESSION;  Surgeon: Phylliss Bob, MD;  Location: [place];  Service: Orthopedics;  Laterality: N/A;  posterior   ORIF FACIAL FRACTURE  1988   PROSTATECTOMY  2012   Patient Active Problem List   Diagnosis Date Noted   Acute non-recurrent maxillary sinusitis 01/28/2021   Callus of foot 03/30/2019   Seborrhea 03/30/2019   GAD (generalized anxiety disorder) 10/01/2018   Hypothyroidism 10/01/2018   Rotator cuff tear arthropathy of right shoulder 04/29/2017   Essential hypertension 03/17/2017   Type 1 diabetes mellitus without complication ([place]) 37/62/8315   Personal history of prostate cancer 08/12/2016   Chronic allergic rhinitis due to pollen 08/12/2016   Degenerative joint disease involving multiple joints 08/12/2016    PCP: Claretta Fraise, MD  REFERRING PROVIDER: Vallarie Mare, MD  REFERRING DIAG: Radiculopathy, lumbar region  Rationale for Evaluation and  Treatment Rehabilitation  THERAPY DIAG:  Radiculopathy, lumbar region  ONSET DATE: 5-6 weeks ago   SUBJECTIVE:                                                                                                                                                                                           SUBJECTIVE STATEMENT: Patient reports that his back and leg are hurting more today. He notes that he got up this morning to go to his bathroom and he could hardly stand up due to his pain.  PERTINENT  HISTORY:  Prior lumbar surgery, HTN, DM, history of cancer (prostate)   PAIN:  Are you having pain? Yes: NPRS scale: 8-9/10 Pain location: back radiating down RLE Pain description: throbbing and numbness Aggravating factors: walking (<5 minutes), standing, bending Relieving factors: sitting, heat,    PRECAUTIONS: None  WEIGHT BEARING RESTRICTIONS No  FALLS:  Has patient fallen in last 6 months? No  LIVING ENVIRONMENT: Lives with: lives with their family Lives in: House/apartment Stairs: Yes: External: 3 steps; on right going up Has following equipment at home: Single point cane  OCCUPATION: not working currently  PLOF: Independent  NEXT MD FOLLOW UP: 05/13/22  PATIENT GOALS: reduced pain, stand and walk longer, return to work    OBJECTIVE:   DIAGNOSTIC FINDINGS: MRI on 03/31/22 Lumbar spondylosis and degenerative disc disease, causing moderate to prominent impingement at L4-5 primarily due to a right lateral recess disc protrusion in conjunction with intervertebral and facet spurring and disc bulge.  SCREENING FOR RED FLAGS: Bowel or bladder incontinence: No, but he feels like he is constipated Spinal tumors: No Cauda equina syndrome: No Compression fracture: No Abdominal aneurysm: No  COGNITION:  Overall cognitive status: Within functional limits for tasks assessed     SENSATION: Light touch: Impaired  and diminished sensation throughout RLE with no dermatomal  pattern  POSTURE: rounded shoulders, forward head, decreased lumbar lordosis, and increased thoracic kyphosis  PALPATION: assessed in sitting due to positional intolerance TTP: Lumbar paraspinal and QL   LUMBAR ROM: unable to be safely assessed at this time due to pain and instability with standing  LOWER EXTREMITY MMT:    MMT Right eval Left eval  Hip flexion 4-/5 4/5  Hip extension    Hip abduction    Hip adduction    Hip internal rotation    Hip external rotation    Knee flexion 4-/5 4/5  Knee extension 3/5 with right leg pain  3+/5  Ankle dorsiflexion 3/5 3+/5  Ankle plantarflexion    Ankle inversion    Ankle eversion     (Blank rows = not tested)  LUMBAR SPECIAL TESTS:  Slump test: Positive  GAIT: Assistive device utilized: None Level of assistance: CGA Comments: lumbar flexion, decreased gait speed, absent hip extension, and required external support with ambulation    TODAY'S TREATMENT                                    8/3 EXERCISE LOG  Exercise Repetitions and Resistance Comments  Nustep L1 x 13 minutes   Isometric ball press (seated) 5 second hold x 30 reps   Ball roll out (seated) 3 minutes   Hip adduction isometric (seated) 5 seconds x 30 reps        Blank cell = exercise not performed today  Modalities: no redness or adverse reaction to today's modalities  Unattended Estim: bilateral lumbar paraspinals, IFC; 80-150 Hz w/ 40% scan, 15 mins, Pain  PATIENT EDUCATION:  Education details: benefits of exercise, pain science  Person educated: Patient Education method: Explanation Education comprehension: verbalized understanding   HOME EXERCISE PROGRAM:   ASSESSMENT:  CLINICAL IMPRESSION: Patient was introduced to multiple new interventions for light lumbar mobility and stability. He required minimal cueing with today's new interventions for proper exercise performance. He reported feeling about the same upon the conclusion of treatment. He  continues to require skilled physical therapy to address his remaining impairments to maximize his  safety and functional mobility.    OBJECTIVE IMPAIRMENTS Abnormal gait, decreased activity tolerance, decreased balance, decreased mobility, difficulty walking, decreased ROM, decreased strength, hypomobility, impaired sensation, and pain.   ACTIVITY LIMITATIONS carrying, lifting, bending, sitting, standing, transfers, and locomotion level  PARTICIPATION LIMITATIONS: shopping and community activity  PERSONAL FACTORS Time since onset of injury/illness/exacerbation and 3+ comorbidities: Prior lumbar surgery, HTN, DM, history of cancer (prostate)   are also affecting patient's functional outcome.   REHAB POTENTIAL: Fair    CLINICAL DECISION MAKING: Unstable/unpredictable  EVALUATION COMPLEXITY: High   GOALS: Goals reviewed with patient? Yes  LONG TERM GOALS: Target date: 05/20/2022  Patient will be independent with his HEP.  Baseline:  Goal status: INITIAL  2.  Patient will be able to complete his daily activities with his familiar pain exceeding 6/10. Baseline:  Goal status: INITIAL  3.  Patient will be able to stand and walk for at least 15 minutes without being limited by his familiar pain.  Baseline:  Goal status: INITIAL  4.  Patient will improve his RLE manual muscle testing to at least equal to his LLE for improved function with his daily activities.  Baseline:  Goal status: INITIAL   PLAN: PT FREQUENCY: 2x/week  PT DURATION: 4 weeks  PLANNED INTERVENTIONS: Therapeutic exercises, Therapeutic activity, Neuromuscular re-education, Balance training, Gait training, Patient/Family education, Self Care, Joint mobilization, Electrical stimulation, Spinal mobilization, Cryotherapy, Moist heat, Manual therapy, and Re-evaluation.  PLAN FOR NEXT SESSION: nustep, lumbar stability, light AROM, and modalities as needed    Darlin Coco, PT 04/24/2022, 8:37 AM

## 2022-04-24 NOTE — Progress Notes (Signed)
Subjective:  Patient ID: Charles Foley, male    DOB: 1961/01/31  Age: 61 y.o. MRN: [id number]  CC: Follow-up and Back Pain   HPI Charles Foley presents for low back pain. Points to right paraspinous muscles at L2 and radiates to R buttocks and posterior thigh and leg, bottom of foot. 9-10/10 pain. If he sits a long time it eases to 3-4/10. Walking makes it peak quicklly.   Considering surgery with neurosurgeon. Dr. Marcello Moores. He ordered P.T. and gaba 100 TID. Has started P.T. this week. Going twice weekly.      04/24/2022    9:36 AM 03/13/2022    9:13 AM 02/24/2022   11:08 AM  Depression screen PHQ 2/9  Decreased Interest 0 0 0  Down, Depressed, Hopeless 0 0 0  PHQ - 2 Score 0 0 0  Altered sleeping  0   Tired, decreased energy  0   Change in appetite  0   Feeling bad or failure about yourself   0   Trouble concentrating  0   Moving slowly or fidgety/restless  0   Suicidal thoughts  0   PHQ-9 Score  0   Difficult doing work/chores  Not difficult at all     History Biff has a past medical history of Acute pain of right knee (06/17/2017), Allergic rhinitis, Anxiety, Arthritis, Cancer ([place]) (2012), Chest pain (11/02/2014), Diabetes mellitus without complication ([place]), Hypertension, and Hypothyroidism.   He has a past surgical history that includes Prostatectomy (2012); ORIF facial fracture (1988); Knee arthroscopy (Bilateral); Colonoscopy; and Lumbar laminectomy/decompression microdiscectomy (N/A, 10/11/2020).   His family history includes Diabetes in his mother; Heart disease in his mother.He reports that he quit smoking about 13 years ago. His smoking use included cigarettes. He quit smokeless tobacco use about 19 years ago. He reports that he does not currently use alcohol. He reports that he does not use drugs.    ROS Review of Systems  Constitutional:  Negative for fever.  Respiratory:  Negative for shortness of breath.   Cardiovascular:  Negative for chest pain.  Musculoskeletal:   Negative for arthralgias.  Skin:  Negative for rash.    Objective:  BP 139/63   Pulse 60   Temp 97.7 F (36.5 C)   Ht '5\' 9"'$  (1.753 m)   Wt 191 lb 6.4 oz (86.8 kg)   SpO2 98%   BMI 28.26 kg/m   BP Readings from Last 3 Encounters:  04/24/22 139/63  03/13/22 (!) 161/68  03/09/22 (!) 149/73    Wt Readings from Last 3 Encounters:  04/24/22 191 lb 6.4 oz (86.8 kg)  03/13/22 189 lb (85.7 kg)  02/24/22 190 lb (86.2 kg)     Physical Exam Vitals reviewed.  Constitutional:      Appearance: He is well-developed.  HENT:     Head: Normocephalic and atraumatic.     Right Ear: External ear normal.     Left Ear: External ear normal.     Mouth/Throat:     Pharynx: No oropharyngeal exudate or posterior oropharyngeal erythema.  Eyes:     Pupils: Pupils are equal, round, and reactive to light.  Cardiovascular:     Rate and Rhythm: Normal rate and regular rhythm.     Heart sounds: No murmur heard. Pulmonary:     Effort: No respiratory distress.     Breath sounds: Normal breath sounds.  Musculoskeletal:     Cervical back: Normal range of motion and neck supple.  Neurological:  Mental Status: He is alert and oriented to person, place, and time.       Assessment & Plan:   There are no diagnoses linked to this encounter.     I have discontinued [place]gia [place] "Charles Foley"'s HYDROcodone-acetaminophen and HYDROcodone-acetaminophen. I am also having him maintain his acetaminophen, multivitamin, cetirizine, triamcinolone cream, traZODone, pantoprazole, clobetasol, ALPRAZolam, atorvastatin, insulin lispro, Toujeo Max SoloStar, levothyroxine, lisinopril-hydrochlorothiazide, ibuprofen, cyclobenzaprine, and gabapentin.  Allergies as of 04/24/2022       Reactions   Latex         Medication List        Accurate as of April 24, 2022 10:05 AM. If you have any questions, ask your nurse or doctor.          STOP taking these medications    HYDROcodone-acetaminophen 5-325 MG  tablet Commonly known as: NORCO/VICODIN Stopped by: Claretta Fraise, MD       TAKE these medications    acetaminophen 325 MG tablet Commonly known as: TYLENOL Take 650 mg by mouth every 6 (six) hours as needed for pain.   ALPRAZolam 0.5 MG tablet Commonly known as: XANAX Take 1.5 tablets (0.75 mg total) by mouth at bedtime as needed. for sleep   atorvastatin 10 MG tablet Commonly known as: LIPITOR Take 1 tablet (10 mg total) by mouth daily.   cetirizine 10 MG tablet Commonly known as: ZYRTEC Take 1 tablet (10 mg total) by mouth daily.   clobetasol 0.05 % external solution Commonly known as: TEMOVATE Apply 1 application topically 2 (two) times daily.   cyclobenzaprine 10 MG tablet Commonly known as: FLEXERIL TAKE ONE TABLET BY MOUTH THREE TIMES DAILY AS NEEDED FOR MUSCLE SPASMS   gabapentin 100 MG capsule Commonly known as: NEURONTIN Take 1 capsule (100 mg total) by mouth 3 (three) times daily.   ibuprofen 800 MG tablet Commonly known as: ADVIL Take 1 tablet (800 mg total) by mouth 3 (three) times daily. Take with food   insulin lispro 100 UNIT/ML KwikPen Commonly known as: HUMALOG INJECT UP TO 20 UNITS 3 TIMES A DAY BEFORE MEALS   levothyroxine 25 MCG tablet Commonly known as: SYNTHROID Take 1 tablet (25 mcg total) by mouth daily.   lisinopril-hydrochlorothiazide 20-12.5 MG tablet Commonly known as: Zestoretic Take 1 tablet by mouth daily.   multivitamin tablet Take 1 tablet by mouth daily.   pantoprazole 40 MG tablet Commonly known as: PROTONIX Take 1 tablet (40 mg total) by mouth daily.   Toujeo Max SoloStar 300 UNIT/ML Solostar Pen Generic drug: insulin glargine (2 Unit Dial) Inject 40 Units into the skin in the morning and at bedtime.   traZODone 150 MG tablet Commonly known as: DESYREL Use from 1/3 to 1 tablet nightly as needed for sleep.   triamcinolone cream 0.1 % Commonly known as: KENALOG APPLY 1 APPLICATION TOPICALLY 2 TIMES DAILY          Follow-up: No follow-ups on file.  Claretta Fraise, M.D.

## 2022-04-25 ENCOUNTER — Encounter: Payer: Self-pay | Admitting: Family Medicine

## 2022-04-28 ENCOUNTER — Ambulatory Visit: Payer: 59

## 2022-04-28 DIAGNOSIS — M5416 Radiculopathy, lumbar region: Secondary | ICD-10-CM | POA: Diagnosis not present

## 2022-04-28 NOTE — Therapy (Signed)
OUTPATIENT PHYSICAL THERAPY THORACOLUMBAR TREATMENT   Patient Name: Charles Foley MRN: [id number] DOB:Dec 26, 1960, 61 y.o., male Today's Date: 04/28/2022   PT End of Session - 04/28/22 0744     Visit Number 3    Number of Visits 8    Date for PT Re-Evaluation 06/20/22    PT Start Time 0731    PT Stop Time 0819    PT Time Calculation (min) 48 min    Activity Tolerance Patient limited by pain    Behavior During Therapy [facility] for tasks assessed/performed               Past Medical History:  Diagnosis Date   Acute pain of right knee 06/17/2017   Allergic rhinitis    Anxiety    Arthritis    Cancer ([place]) 2012   prostate   Chest pain 11/02/2014   atypical, no cardiac testing ordered per Dorris Carnes, MD 11/03/14   Diabetes mellitus without complication ([place])    type II   Hypertension    Hypothyroidism    Past Surgical History:  Procedure Laterality Date   COLONOSCOPY     KNEE ARTHROSCOPY Bilateral    LUMBAR LAMINECTOMY/DECOMPRESSION MICRODISCECTOMY N/A 10/11/2020   Procedure: LUMBAR FOUR -LUMBAR FIVE DECOMPRESSION;  Surgeon: Phylliss Bob, MD;  Location: [place];  Service: Orthopedics;  Laterality: N/A;  posterior   ORIF FACIAL FRACTURE  1988   PROSTATECTOMY  2012   Patient Active Problem List   Diagnosis Date Noted   Acute non-recurrent maxillary sinusitis 01/28/2021   Callus of foot 03/30/2019   Seborrhea 03/30/2019   GAD (generalized anxiety disorder) 10/01/2018   Hypothyroidism 10/01/2018   Rotator cuff tear arthropathy of right shoulder 04/29/2017   Essential hypertension 03/17/2017   Type 1 diabetes mellitus without complication ([place]) 87/86/7672   Personal history of prostate cancer 08/12/2016   Chronic allergic rhinitis due to pollen 08/12/2016   Degenerative joint disease involving multiple joints 08/12/2016    PCP: Claretta Fraise, MD  REFERRING PROVIDER: Vallarie Mare, MD  REFERRING DIAG: Radiculopathy, lumbar region  Rationale for Evaluation and  Treatment Rehabilitation  THERAPY DIAG:  Radiculopathy, lumbar region  ONSET DATE: 5-6 weeks ago   SUBJECTIVE:                                                                                                                                                                                           SUBJECTIVE STATEMENT: Patient reports that he had a follow up with his PCP after his last appointment and they increased his pain medication, but he is unsure if this is helping. He did not notice any significant  difference after his last appointment. He notes that walking is still the most painful.  PERTINENT HISTORY:  Prior lumbar surgery, HTN, DM, history of cancer (prostate)   PAIN:  Are you having pain? Yes: NPRS scale: no pain score provided/10 Pain location: back radiating down RLE Pain description: throbbing and numbness Aggravating factors: walking (<5 minutes), standing, bending Relieving factors: sitting, heat,    PRECAUTIONS: None  WEIGHT BEARING RESTRICTIONS No  FALLS:  Has patient fallen in last 6 months? No  LIVING ENVIRONMENT: Lives with: lives with their family Lives in: House/apartment Stairs: Yes: External: 3 steps; on right going up Has following equipment at home: Single point cane  OCCUPATION: not working currently  PLOF: Independent  NEXT MD FOLLOW UP: 05/13/22  PATIENT GOALS: reduced pain, stand and walk longer, return to work    OBJECTIVE: assessments were performed on 04/22/22 at his initial evaluation unless otherwise noted  DIAGNOSTIC FINDINGS: MRI on 03/31/22 Lumbar spondylosis and degenerative disc disease, causing moderate to prominent impingement at L4-5 primarily due to a right lateral recess disc protrusion in conjunction with intervertebral and facet spurring and disc bulge.  SCREENING FOR RED FLAGS: Bowel or bladder incontinence: No, but he feels like he is constipated Spinal tumors: No Cauda equina syndrome: No Compression fracture:  No Abdominal aneurysm: No  COGNITION:  Overall cognitive status: Within functional limits for tasks assessed     SENSATION: Light touch: Impaired  and diminished sensation throughout RLE with no dermatomal pattern  POSTURE: rounded shoulders, forward head, decreased lumbar lordosis, and increased thoracic kyphosis  PALPATION: assessed in sitting due to positional intolerance TTP: Lumbar paraspinal and QL   LUMBAR ROM: unable to be safely assessed at this time due to pain and instability with standing  LOWER EXTREMITY MMT:    MMT Right eval Left eval  Hip flexion 4-/5 4/5  Hip extension    Hip abduction    Hip adduction    Hip internal rotation    Hip external rotation    Knee flexion 4-/5 4/5  Knee extension 3/5 with right leg pain  3+/5  Ankle dorsiflexion 3/5 3+/5  Ankle plantarflexion    Ankle inversion    Ankle eversion     (Blank rows = not tested)  LUMBAR SPECIAL TESTS:  Slump test: Positive  GAIT: Assistive device utilized: None Level of assistance: CGA Comments: lumbar flexion, decreased gait speed, absent hip extension, and required external support with ambulation    TODAY'S TREATMENT                                    8/7 EXERCISE LOG  Exercise Repetitions and Resistance Comments  Nustep L1 x 15 minutes   LAQ 3 minutes   Hip adduction isometric 5 second hold x 3 minutes            Blank cell = exercise not performed today  Modalities: no redness or adverse reactions to today's modalities  Unattended Estim: Right lumbar paraspinals and QL, IFC @ 80-150 Hz w/ 40% scan, 15 mins, Pain                                   8/3 EXERCISE LOG  Exercise Repetitions and Resistance Comments  Nustep L1 x 13 minutes   Isometric ball press (seated) 5 second hold x 30 reps  Ball roll out (seated) 3 minutes   Hip adduction isometric (seated) 5 seconds x 30 reps        Blank cell = exercise not performed today  Modalities: no redness or adverse reaction to  today's modalities  Unattended Estim: bilateral lumbar paraspinals, IFC; 80-150 Hz w/ 40% scan, 15 mins, Pain  PATIENT EDUCATION:  Education details: pain, nerves Person educated: Patient Education method: Explanation Education comprehension: verbalized understanding   HOME EXERCISE PROGRAM:   ASSESSMENT:  CLINICAL IMPRESSION: Patient was introduced to long arc quads for improved lower extremity mobility and strength needed for improved function with his daily activities. Pain continues to limit his ability to complete standing interventions. Modalities at the conclusion of today's active interventions provided minimal relief. He reported that his back still hurt upon the conclusion of treatment. He continues to require skilled physical therapy to address his remaining impairments to maximize his functional mobility.    OBJECTIVE IMPAIRMENTS Abnormal gait, decreased activity tolerance, decreased balance, decreased mobility, difficulty walking, decreased ROM, decreased strength, hypomobility, impaired sensation, and pain.   ACTIVITY LIMITATIONS carrying, lifting, bending, sitting, standing, transfers, and locomotion level  PARTICIPATION LIMITATIONS: shopping and community activity  PERSONAL FACTORS Time since onset of injury/illness/exacerbation and 3+ comorbidities: Prior lumbar surgery, HTN, DM, history of cancer (prostate)   are also affecting patient's functional outcome.   REHAB POTENTIAL: Fair    CLINICAL DECISION MAKING: Unstable/unpredictable  EVALUATION COMPLEXITY: High   GOALS: Goals reviewed with patient? Yes  LONG TERM GOALS: Target date: 05/20/2022  Patient will be independent with his HEP.  Baseline:  Goal status: INITIAL  2.  Patient will be able to complete his daily activities with his familiar pain exceeding 6/10. Baseline:  Goal status: INITIAL  3.  Patient will be able to stand and walk for at least 15 minutes without being limited by his familiar  pain.  Baseline:  Goal status: INITIAL  4.  Patient will improve his RLE manual muscle testing to at least equal to his LLE for improved function with his daily activities.  Baseline:  Goal status: INITIAL   PLAN: PT FREQUENCY: 2x/week  PT DURATION: 4 weeks  PLANNED INTERVENTIONS: Therapeutic exercises, Therapeutic activity, Neuromuscular re-education, Balance training, Gait training, Patient/Family education, Self Care, Joint mobilization, Electrical stimulation, Spinal mobilization, Cryotherapy, Moist heat, Manual therapy, and Re-evaluation.  PLAN FOR NEXT SESSION: nustep, lumbar stability, light AROM, and modalities as needed    Darlin Coco, PT 04/28/2022, 8:23 AM

## 2022-05-01 ENCOUNTER — Ambulatory Visit: Payer: 59

## 2022-05-01 DIAGNOSIS — M5416 Radiculopathy, lumbar region: Secondary | ICD-10-CM

## 2022-05-01 NOTE — Therapy (Signed)
OUTPATIENT PHYSICAL THERAPY THORACOLUMBAR TREATMENT   Patient Name: Charles Foley MRN: [id number] DOB:[birth year], 61 y.o., male Today's Date: 05/01/2022   PT End of Session - 05/01/22 0735     Visit Number 4    Number of Visits 8    Date for PT Re-Evaluation 06/20/22    PT Start Time 0730    PT Stop Time 0825    PT Time Calculation (min) 55 min    Activity Tolerance Patient limited by pain    Behavior During Therapy [facility] for tasks assessed/performed                Past Medical History:  Diagnosis Date   Acute pain of right knee 06/17/2017   Allergic rhinitis    Anxiety    Arthritis    Cancer ([place]) 2012   prostate   Chest pain 11/02/2014   atypical, no cardiac testing ordered per Dorris Carnes, MD 11/03/14   Diabetes mellitus without complication ([place])    type II   Hypertension    Hypothyroidism    Past Surgical History:  Procedure Laterality Date   COLONOSCOPY     KNEE ARTHROSCOPY Bilateral    LUMBAR LAMINECTOMY/DECOMPRESSION MICRODISCECTOMY N/A 10/11/2020   Procedure: LUMBAR FOUR -LUMBAR FIVE DECOMPRESSION;  Surgeon: Phylliss Bob, MD;  Location: [place];  Service: Orthopedics;  Laterality: N/A;  posterior   ORIF FACIAL FRACTURE  1988   PROSTATECTOMY  2012   Patient Active Problem List   Diagnosis Date Noted   Acute non-recurrent maxillary sinusitis 01/28/2021   Callus of foot 03/30/2019   Seborrhea 03/30/2019   GAD (generalized anxiety disorder) 10/01/2018   Hypothyroidism 10/01/2018   Rotator cuff tear arthropathy of right shoulder 04/29/2017   Essential hypertension 03/17/2017   Type 1 diabetes mellitus without complication ([place]) 36/64/4034   Personal history of prostate cancer 08/12/2016   Chronic allergic rhinitis due to pollen 08/12/2016   Degenerative joint disease involving multiple joints 08/12/2016    PCP: Claretta Fraise, MD  REFERRING PROVIDER: Vallarie Mare, MD  REFERRING DIAG: Radiculopathy, lumbar region  Rationale for Evaluation and  Treatment Rehabilitation  THERAPY DIAG:  Radiculopathy, lumbar region  ONSET DATE: 5-6 weeks ago   SUBJECTIVE:                                                                                                                                                                                           SUBJECTIVE STATEMENT: Patient feels like his new medication is helping some. However, he still has some back and right calf pain with standing and walking. He notes that he feels a little better after his appointments,  but it does not last.  PERTINENT HISTORY:  Prior lumbar surgery, HTN, DM, history of cancer (prostate)   PAIN:  Are you having pain? Yes: NPRS scale: 7-8/10 Pain location: back radiating down RLE Pain description: throbbing and numbness Aggravating factors: walking (<5 minutes), standing, bending Relieving factors: sitting, heat,    PRECAUTIONS: None  WEIGHT BEARING RESTRICTIONS No  FALLS:  Has patient fallen in last 6 months? No  LIVING ENVIRONMENT: Lives with: lives with their family Lives in: House/apartment Stairs: Yes: External: 3 steps; on right going up Has following equipment at home: Single point cane  OCCUPATION: not working currently  PLOF: Independent  NEXT MD FOLLOW UP: 05/13/22  PATIENT GOALS: reduced pain, stand and walk longer, return to work    OBJECTIVE: assessments were performed on 04/22/22 at his initial evaluation unless otherwise noted  DIAGNOSTIC FINDINGS: MRI on 03/31/22 Lumbar spondylosis and degenerative disc disease, causing moderate to prominent impingement at L4-5 primarily due to a right lateral recess disc protrusion in conjunction with intervertebral and facet spurring and disc bulge.  SCREENING FOR RED FLAGS: Bowel or bladder incontinence: No, but he feels like he is constipated Spinal tumors: No Cauda equina syndrome: No Compression fracture: No Abdominal aneurysm: No  COGNITION:  Overall cognitive status: Within  functional limits for tasks assessed     SENSATION: Light touch: Impaired  and diminished sensation throughout RLE with no dermatomal pattern  POSTURE: rounded shoulders, forward head, decreased lumbar lordosis, and increased thoracic kyphosis  PALPATION: assessed in sitting due to positional intolerance TTP: Lumbar paraspinal and QL   LUMBAR ROM: unable to be safely assessed at this time due to pain and instability with standing  LOWER EXTREMITY MMT:    MMT Right eval Left eval  Hip flexion 4-/5 4/5  Hip extension    Hip abduction    Hip adduction    Hip internal rotation    Hip external rotation    Knee flexion 4-/5 4/5  Knee extension 3/5 with right leg pain  3+/5  Ankle dorsiflexion 3/5 3+/5  Ankle plantarflexion    Ankle inversion    Ankle eversion     (Blank rows = not tested)  LUMBAR SPECIAL TESTS:  Slump test: Positive  GAIT: Assistive device utilized: None Level of assistance: CGA Comments: lumbar flexion, decreased gait speed, absent hip extension, and required external support with ambulation    TODAY'S TREATMENT                                    8/10 EXERCISE LOG  Exercise Repetitions and Resistance Comments  Nustep L3 x 17 minutes   Gastroc stretch (seated) RLE; 3 x 30 seconds   Hamstring stretch (seated) RLE; 3 x 30 seconds   LAQ RLE x 30 reps        Blank cell = exercise not performed today  Modalities: no redness or   Date:  Unattended Estim: Right low back and calf, IFC @ 80-150 Hz w/ 40% scan, 15 mins, Pain                                    8/7 EXERCISE LOG  Exercise Repetitions and Resistance Comments  Nustep L1 x 15 minutes   LAQ 3 minutes   Hip adduction isometric 5 second hold x 3 minutes  Blank cell = exercise not performed today  Modalities: no redness or adverse reactions to today's modalities  Unattended Estim: Right lumbar paraspinals and QL, IFC @ 80-150 Hz w/ 40% scan, 15 mins, Pain                                    8/3 EXERCISE LOG  Exercise Repetitions and Resistance Comments  Nustep L1 x 13 minutes   Isometric ball press (seated) 5 second hold x 30 reps   Ball roll out (seated) 3 minutes   Hip adduction isometric (seated) 5 seconds x 30 reps        Blank cell = exercise not performed today  Modalities: no redness or adverse reaction to today's modalities  Unattended Estim: bilateral lumbar paraspinals, IFC; 80-150 Hz w/ 40% scan, 15 mins, Pain  PATIENT EDUCATION:  Education details: pain, nerves Person educated: Patient Education method: Explanation Education comprehension: verbalized understanding   HOME EXERCISE PROGRAM:   ASSESSMENT:  CLINICAL IMPRESSION: Patient was introduced to multiple new interventions for improved lower extremity soft tissue extensibility He required minimal cueing with today's new interventions for proper positioning and exercise performance. He reported feeling a little better while sitting, but his familiar pain returned with standing at the conclusion of treatment. He continues to require skilled physical therapy to address his remaining impairments to maximize his functional mobility.    OBJECTIVE IMPAIRMENTS Abnormal gait, decreased activity tolerance, decreased balance, decreased mobility, difficulty walking, decreased ROM, decreased strength, hypomobility, impaired sensation, and pain.   ACTIVITY LIMITATIONS carrying, lifting, bending, sitting, standing, transfers, and locomotion level  PARTICIPATION LIMITATIONS: shopping and community activity  PERSONAL FACTORS Time since onset of injury/illness/exacerbation and 3+ comorbidities: Prior lumbar surgery, HTN, DM, history of cancer (prostate)   are also affecting patient's functional outcome.   REHAB POTENTIAL: Fair    CLINICAL DECISION MAKING: Unstable/unpredictable  EVALUATION COMPLEXITY: High   GOALS: Goals reviewed with patient? Yes  LONG TERM GOALS: Target date: 05/20/2022  Patient  will be independent with his HEP.  Baseline:  Goal status: INITIAL  2.  Patient will be able to complete his daily activities with his familiar pain exceeding 6/10. Baseline:  Goal status: INITIAL  3.  Patient will be able to stand and walk for at least 15 minutes without being limited by his familiar pain.  Baseline:  Goal status: INITIAL  4.  Patient will improve his RLE manual muscle testing to at least equal to his LLE for improved function with his daily activities.  Baseline:  Goal status: INITIAL   PLAN: PT FREQUENCY: 2x/week  PT DURATION: 4 weeks  PLANNED INTERVENTIONS: Therapeutic exercises, Therapeutic activity, Neuromuscular re-education, Balance training, Gait training, Patient/Family education, Self Care, Joint mobilization, Electrical stimulation, Spinal mobilization, Cryotherapy, Moist heat, Manual therapy, and Re-evaluation.  PLAN FOR NEXT SESSION: nustep, lumbar stability, light AROM, and modalities as needed    Darlin Coco, PT 05/01/2022, 10:05 AM

## 2022-05-05 ENCOUNTER — Ambulatory Visit: Payer: 59

## 2022-05-05 DIAGNOSIS — M5416 Radiculopathy, lumbar region: Secondary | ICD-10-CM

## 2022-05-05 NOTE — Therapy (Signed)
OUTPATIENT PHYSICAL THERAPY THORACOLUMBAR TREATMENT   Patient Name: Charles Foley MRN: [id number] DOB:03/31/1961, 61 y.o., male Today's Date: 05/05/2022   PT End of Session - 05/05/22 0736     Visit Number 5    Number of Visits 8    Date for PT Re-Evaluation 06/20/22    PT Start Time 0730    PT Stop Time 0825    PT Time Calculation (min) 55 min    Activity Tolerance Patient limited by pain    Behavior During Therapy [facility] for tasks assessed/performed                 Past Medical History:  Diagnosis Date   Acute pain of right knee 06/17/2017   Allergic rhinitis    Anxiety    Arthritis    Cancer ([place]) 2012   prostate   Chest pain 11/02/2014   atypical, no cardiac testing ordered per Dorris Carnes, MD 11/03/14   Diabetes mellitus without complication ([place])    type II   Hypertension    Hypothyroidism    Past Surgical History:  Procedure Laterality Date   COLONOSCOPY     KNEE ARTHROSCOPY Bilateral    LUMBAR LAMINECTOMY/DECOMPRESSION MICRODISCECTOMY N/A 10/11/2020   Procedure: LUMBAR FOUR -LUMBAR FIVE DECOMPRESSION;  Surgeon: Phylliss Bob, MD;  Location: [place];  Service: Orthopedics;  Laterality: N/A;  posterior   ORIF FACIAL FRACTURE  1988   PROSTATECTOMY  2012   Patient Active Problem List   Diagnosis Date Noted   Acute non-recurrent maxillary sinusitis 01/28/2021   Callus of foot 03/30/2019   Seborrhea 03/30/2019   GAD (generalized anxiety disorder) 10/01/2018   Hypothyroidism 10/01/2018   Rotator cuff tear arthropathy of right shoulder 04/29/2017   Essential hypertension 03/17/2017   Type 1 diabetes mellitus without complication ([place]) 65/78/4696   Personal history of prostate cancer 08/12/2016   Chronic allergic rhinitis due to pollen 08/12/2016   Degenerative joint disease involving multiple joints 08/12/2016    PCP: Claretta Fraise, MD  REFERRING PROVIDER: Vallarie Mare, MD  REFERRING DIAG: Radiculopathy, lumbar region  Rationale for Evaluation and  Treatment Rehabilitation  THERAPY DIAG:  Radiculopathy, lumbar region  ONSET DATE: 5-6 weeks ago   SUBJECTIVE:                                                                                                                                                                                           SUBJECTIVE STATEMENT: Patient reports that his back feels about the same as it has been. He has begun to notice pain in his left hip and leg, but it is not as bad as his  right leg.  PERTINENT HISTORY:  Prior lumbar surgery, HTN, DM, history of cancer (prostate)   PAIN:  Are you having pain? Yes: NPRS scale: 7-8/10 Pain location: back radiating down RLE Pain description: throbbing and numbness Aggravating factors: walking (<5 minutes), standing, bending Relieving factors: sitting, heat,    PRECAUTIONS: None  WEIGHT BEARING RESTRICTIONS No  FALLS:  Has patient fallen in last 6 months? No  LIVING ENVIRONMENT: Lives with: lives with their family Lives in: House/apartment Stairs: Yes: External: 3 steps; on right going up Has following equipment at home: Single point cane  OCCUPATION: not working currently  PLOF: Independent  NEXT MD FOLLOW UP: 05/13/22  PATIENT GOALS: reduced pain, stand and walk longer, return to work    OBJECTIVE: assessments were performed on 04/22/22 at his initial evaluation unless otherwise noted  DIAGNOSTIC FINDINGS: MRI on 03/31/22 Lumbar spondylosis and degenerative disc disease, causing moderate to prominent impingement at L4-5 primarily due to a right lateral recess disc protrusion in conjunction with intervertebral and facet spurring and disc bulge.  SCREENING FOR RED FLAGS: Bowel or bladder incontinence: No, but he feels like he is constipated Spinal tumors: No Cauda equina syndrome: No Compression fracture: No Abdominal aneurysm: No  COGNITION:  Overall cognitive status: Within functional limits for tasks assessed     SENSATION: Light touch:  Impaired  and diminished sensation throughout RLE with no dermatomal pattern  POSTURE: rounded shoulders, forward head, decreased lumbar lordosis, and increased thoracic kyphosis  PALPATION: assessed in sitting due to positional intolerance TTP: Lumbar paraspinal and QL   LUMBAR ROM: unable to be safely assessed at this time due to pain and instability with standing  LOWER EXTREMITY MMT:    MMT Right eval Left eval  Hip flexion 4-/5 4/5  Hip extension    Hip abduction    Hip adduction    Hip internal rotation    Hip external rotation    Knee flexion 4-/5 4/5  Knee extension 3/5 with right leg pain  3+/5  Ankle dorsiflexion 3/5 3+/5  Ankle plantarflexion    Ankle inversion    Ankle eversion     (Blank rows = not tested)  LUMBAR SPECIAL TESTS:  Slump test: Positive  GAIT: Assistive device utilized: None Level of assistance: CGA Comments: lumbar flexion, decreased gait speed, absent hip extension, and required external support with ambulation    TODAY'S TREATMENT                                    8/14 EXERCISE LOG  Exercise Repetitions and Resistance Comments  Nustep L4 x 17 minutes   Lower trunk rotation 30 reps   Glute sets  Attempted, but limited by low back pain   Supine march Alternating LE; 20 reps each   Bent knee fall out Alternating LE; 3 minutes   Hip adduction isometric (supine) 5 second hold x 2 minutes    Blank cell = exercise not performed today  Modalities: no redness or adverse reactions to today's modalities  Date:  Unattended Estim: bilateral lumbar paraspinals, IFC @ 80-150 Hz w/ 40% scan, 15 mins, Pain                                   8/10 EXERCISE LOG  Exercise Repetitions and Resistance Comments  Nustep L3 x 17 minutes   Gastroc  stretch (seated) RLE; 3 x 30 seconds   Hamstring stretch (seated) RLE; 3 x 30 seconds   LAQ RLE x 30 reps        Blank cell = exercise not performed today  Modalities: no redness or   Date:  Unattended  Estim: Right low back and calf, IFC @ 80-150 Hz w/ 40% scan, 15 mins, Pain                                    8/7 EXERCISE LOG  Exercise Repetitions and Resistance Comments  Nustep L1 x 15 minutes   LAQ 3 minutes   Hip adduction isometric 5 second hold x 3 minutes            Blank cell = exercise not performed today  Modalities: no redness or adverse reactions to today's modalities  Unattended Estim: Right lumbar paraspinals and QL, IFC @ 80-150 Hz w/ 40% scan, 15 mins, Pain     PATIENT EDUCATION:  Education details: pain, nerves Person educated: Patient Education method: Explanation Education comprehension: verbalized understanding   HOME EXERCISE PROGRAM:   ASSESSMENT:  CLINICAL IMPRESSION: Patient was introduced to supine interventions for improved lumbar mobility and light strengthening. He continues to experienced moderate to high pain irritability which limit his ability to be introduced to new interventions such as glute sets. The nustep and bent knee fall outs were the least aggravating to his familiar symptoms. He reported that his back felt about the same upon standing at the conclusion of treatment. He continues to require skilled physical therapy to address his remaining impairments to maximize his safety and functional mobility.    OBJECTIVE IMPAIRMENTS Abnormal gait, decreased activity tolerance, decreased balance, decreased mobility, difficulty walking, decreased ROM, decreased strength, hypomobility, impaired sensation, and pain.   ACTIVITY LIMITATIONS carrying, lifting, bending, sitting, standing, transfers, and locomotion level  PARTICIPATION LIMITATIONS: shopping and community activity  PERSONAL FACTORS Time since onset of injury/illness/exacerbation and 3+ comorbidities: Prior lumbar surgery, HTN, DM, history of cancer (prostate)   are also affecting patient's functional outcome.   REHAB POTENTIAL: Fair    CLINICAL DECISION MAKING:  Unstable/unpredictable  EVALUATION COMPLEXITY: High   GOALS: Goals reviewed with patient? Yes  LONG TERM GOALS: Target date: 05/20/2022  Patient will be independent with his HEP.  Baseline:  Goal status: INITIAL  2.  Patient will be able to complete his daily activities with his familiar pain exceeding 6/10. Baseline:  Goal status: INITIAL  3.  Patient will be able to stand and walk for at least 15 minutes without being limited by his familiar pain.  Baseline:  Goal status: INITIAL  4.  Patient will improve his RLE manual muscle testing to at least equal to his LLE for improved function with his daily activities.  Baseline:  Goal status: INITIAL   PLAN: PT FREQUENCY: 2x/week  PT DURATION: 4 weeks  PLANNED INTERVENTIONS: Therapeutic exercises, Therapeutic activity, Neuromuscular re-education, Balance training, Gait training, Patient/Family education, Self Care, Joint mobilization, Electrical stimulation, Spinal mobilization, Cryotherapy, Moist heat, Manual therapy, and Re-evaluation.  PLAN FOR NEXT SESSION: nustep, lumbar stability, light AROM, and modalities as needed    Darlin Coco, PT 05/05/2022, 8:30 AM

## 2022-05-12 ENCOUNTER — Ambulatory Visit: Payer: 59

## 2022-05-12 DIAGNOSIS — M5416 Radiculopathy, lumbar region: Secondary | ICD-10-CM

## 2022-05-12 NOTE — Therapy (Signed)
OUTPATIENT PHYSICAL THERAPY THORACOLUMBAR TREATMENT   Patient Name: Charles Foley MRN: [id number] DOB:07-Jul-1961, 61 y.o., male Today's Date: 05/12/2022   PT End of Session - 05/12/22 0728     Visit Number 6    Number of Visits 8    Date for PT Re-Evaluation 06/20/22    PT Start Time 0730    PT Stop Time 0829    PT Time Calculation (min) 59 min    Activity Tolerance Patient limited by pain    Behavior During Therapy [facility] for tasks assessed/performed                 Past Medical History:  Diagnosis Date   Acute pain of right knee 06/17/2017   Allergic rhinitis    Anxiety    Arthritis    Cancer ([place]) 2012   prostate   Chest pain 11/02/2014   atypical, no cardiac testing ordered per Dorris Carnes, MD 11/03/14   Diabetes mellitus without complication ([place])    type II   Hypertension    Hypothyroidism    Past Surgical History:  Procedure Laterality Date   COLONOSCOPY     KNEE ARTHROSCOPY Bilateral    LUMBAR LAMINECTOMY/DECOMPRESSION MICRODISCECTOMY N/A 10/11/2020   Procedure: LUMBAR FOUR -LUMBAR FIVE DECOMPRESSION;  Surgeon: Phylliss Bob, MD;  Location: [place];  Service: Orthopedics;  Laterality: N/A;  posterior   ORIF FACIAL FRACTURE  1988   PROSTATECTOMY  2012   Patient Active Problem List   Diagnosis Date Noted   Acute non-recurrent maxillary sinusitis 01/28/2021   Callus of foot 03/30/2019   Seborrhea 03/30/2019   GAD (generalized anxiety disorder) 10/01/2018   Hypothyroidism 10/01/2018   Rotator cuff tear arthropathy of right shoulder 04/29/2017   Essential hypertension 03/17/2017   Type 1 diabetes mellitus without complication ([place]) 12/04/9456   Personal history of prostate cancer 08/12/2016   Chronic allergic rhinitis due to pollen 08/12/2016   Degenerative joint disease involving multiple joints 08/12/2016    PCP: Claretta Fraise, MD  REFERRING PROVIDER: Vallarie Mare, MD  REFERRING DIAG: Radiculopathy, lumbar region  Rationale for Evaluation and  Treatment Rehabilitation  THERAPY DIAG:  Radiculopathy, lumbar region  ONSET DATE: 5-6 weeks ago   SUBJECTIVE:                                                                                                                                                                                           SUBJECTIVE STATEMENT: Patient reports that his symptoms have begun to get worse as now his pain is constant and he is having numbness in both feet. He is also having difficulty sleeping at night due  to the pain. He is scheduled for an injection tomorrow.  PERTINENT HISTORY:  Prior lumbar surgery, HTN, DM, history of cancer (prostate)   PAIN:  Are you having pain? Yes: NPRS scale: 7-8/10 Pain location: back radiating down RLE Pain description: throbbing and numbness Aggravating factors: walking (<5 minutes), standing, bending Relieving factors: sitting, heat,    PRECAUTIONS: None  WEIGHT BEARING RESTRICTIONS No  FALLS:  Has patient fallen in last 6 months? No  LIVING ENVIRONMENT: Lives with: lives with their family Lives in: House/apartment Stairs: Yes: External: 3 steps; on right going up Has following equipment at home: Single point cane  OCCUPATION: not working currently  PLOF: Independent  NEXT MD FOLLOW UP: 05/13/22  PATIENT GOALS: reduced pain, stand and walk longer, return to work    OBJECTIVE: assessments were performed on 04/22/22 at his initial evaluation unless otherwise noted  DIAGNOSTIC FINDINGS: MRI on 03/31/22 Lumbar spondylosis and degenerative disc disease, causing moderate to prominent impingement at L4-5 primarily due to a right lateral recess disc protrusion in conjunction with intervertebral and facet spurring and disc bulge.  SCREENING FOR RED FLAGS: Bowel or bladder incontinence: No, but he feels like he is constipated Spinal tumors: No Cauda equina syndrome: No Compression fracture: No Abdominal aneurysm: No  COGNITION:  Overall cognitive status:  Within functional limits for tasks assessed     SENSATION: Light touch: Impaired  and diminished sensation throughout RLE with no dermatomal pattern  POSTURE: rounded shoulders, forward head, decreased lumbar lordosis, and increased thoracic kyphosis  PALPATION: assessed in sitting due to positional intolerance TTP: Lumbar paraspinal and QL   LUMBAR ROM: unable to be safely assessed at this time due to pain and instability with standing  LOWER EXTREMITY MMT:    MMT Right eval Left eval  Hip flexion 4-/5 4/5  Hip extension    Hip abduction    Hip adduction    Hip internal rotation    Hip external rotation    Knee flexion 4-/5 4/5  Knee extension 3/5 with right leg pain  3+/5  Ankle dorsiflexion 3/5 3+/5  Ankle plantarflexion    Ankle inversion    Ankle eversion     (Blank rows = not tested)  LUMBAR SPECIAL TESTS:  Slump test: Positive  GAIT: Assistive device utilized: None Level of assistance: CGA Comments: lumbar flexion, decreased gait speed, absent hip extension, and required external support with ambulation    TODAY'S TREATMENT                                    8/21 EXERCISE LOG  Exercise Repetitions and Resistance Comments  Nustep L3 x 20 minutes                    Blank cell = exercise not performed today  Manual Therapy Soft Tissue Mobilization: bilateral lumbar paraspinals, for reduced pain and tone   Modalities  Date:  Unattended Estim: bilateral lumbar paraspinals, IFC @ 80-150 Hz w/ 40% scan, 15 mins, Pain and Tone                                   8/14 EXERCISE LOG  Exercise Repetitions and Resistance Comments  Nustep L4 x 17 minutes   Lower trunk rotation 30 reps   Glute sets  Attempted, but limited by low back pain  Supine march Alternating LE; 20 reps each   Bent knee fall out Alternating LE; 3 minutes   Hip adduction isometric (supine) 5 second hold x 2 minutes    Blank cell = exercise not performed today  Modalities: no redness or  adverse reactions to today's modalities  Date:  Unattended Estim: bilateral lumbar paraspinals, IFC @ 80-150 Hz w/ 40% scan, 15 mins, Pain                                   8/10 EXERCISE LOG  Exercise Repetitions and Resistance Comments  Nustep L3 x 17 minutes   Gastroc stretch (seated) RLE; 3 x 30 seconds   Hamstring stretch (seated) RLE; 3 x 30 seconds   LAQ RLE x 30 reps        Blank cell = exercise not performed today  Modalities: no redness or   Date:  Unattended Estim: Right low back and calf, IFC @ 80-150 Hz w/ 40% scan, 15 mins, Pain     PATIENT EDUCATION:  Education details: pain, nerves Person educated: Patient Education method: Explanation Education comprehension: verbalized understanding   HOME EXERCISE PROGRAM:   ASSESSMENT:  CLINICAL IMPRESSION: Treatment focused on familiar interventions and manual therapy. Manual therapy focused on soft tissue mobilization to his bilateral lumbar paraspinals for reduced pain and tone. This was slightly effective at reducing his familiar low back pain. However, it was unable to provide symptom relief throughout treatment as he reported feeling about the same upon the conclusion of treatment. He continues to require skilled physical therapy to address his remaining impairments to maximize his functional mobility.    OBJECTIVE IMPAIRMENTS Abnormal gait, decreased activity tolerance, decreased balance, decreased mobility, difficulty walking, decreased ROM, decreased strength, hypomobility, impaired sensation, and pain.   ACTIVITY LIMITATIONS carrying, lifting, bending, sitting, standing, transfers, and locomotion level  PARTICIPATION LIMITATIONS: shopping and community activity  PERSONAL FACTORS Time since onset of injury/illness/exacerbation and 3+ comorbidities: Prior lumbar surgery, HTN, DM, history of cancer (prostate)   are also affecting patient's functional outcome.   REHAB POTENTIAL: Fair    CLINICAL DECISION MAKING:  Unstable/unpredictable  EVALUATION COMPLEXITY: High   GOALS: Goals reviewed with patient? Yes  LONG TERM GOALS: Target date: 05/20/2022  Patient will be independent with his HEP.  Baseline:  Goal status: INITIAL  2.  Patient will be able to complete his daily activities with his familiar pain exceeding 6/10. Baseline:  Goal status: INITIAL  3.  Patient will be able to stand and walk for at least 15 minutes without being limited by his familiar pain.  Baseline:  Goal status: INITIAL  4.  Patient will improve his RLE manual muscle testing to at least equal to his LLE for improved function with his daily activities.  Baseline:  Goal status: INITIAL   PLAN: PT FREQUENCY: 2x/week  PT DURATION: 4 weeks  PLANNED INTERVENTIONS: Therapeutic exercises, Therapeutic activity, Neuromuscular re-education, Balance training, Gait training, Patient/Family education, Self Care, Joint mobilization, Electrical stimulation, Spinal mobilization, Cryotherapy, Moist heat, Manual therapy, and Re-evaluation.  PLAN FOR NEXT SESSION: nustep, lumbar stability, light AROM, and modalities as needed    Darlin Coco, PT 05/12/2022, 8:37 AM

## 2022-05-16 ENCOUNTER — Ambulatory Visit: Payer: 59

## 2022-05-16 DIAGNOSIS — M5416 Radiculopathy, lumbar region: Secondary | ICD-10-CM | POA: Diagnosis not present

## 2022-05-16 NOTE — Therapy (Signed)
OUTPATIENT PHYSICAL THERAPY THORACOLUMBAR TREATMENT   Patient Name: Charles Foley MRN: [id number] DOB:[birth year], 61 y.o., male Today's Date: 05/16/2022   PT End of Session - 05/16/22 0808     Visit Number 7    Number of Visits 8    Date for PT Re-Evaluation 06/20/22    PT Start Time 0815    PT Stop Time 0848    PT Time Calculation (min) 33 min    Activity Tolerance Patient limited by pain    Behavior During Therapy [facility] for tasks assessed/performed                  Past Medical History:  Diagnosis Date   Acute pain of right knee 06/17/2017   Allergic rhinitis    Anxiety    Arthritis    Cancer ([place]) 2012   prostate   Chest pain 11/02/2014   atypical, no cardiac testing ordered per Dorris Carnes, MD 11/03/14   Diabetes mellitus without complication ([place])    type II   Hypertension    Hypothyroidism    Past Surgical History:  Procedure Laterality Date   COLONOSCOPY     KNEE ARTHROSCOPY Bilateral    LUMBAR LAMINECTOMY/DECOMPRESSION MICRODISCECTOMY N/A 10/11/2020   Procedure: LUMBAR FOUR -LUMBAR FIVE DECOMPRESSION;  Surgeon: Phylliss Bob, MD;  Location: [place];  Service: Orthopedics;  Laterality: N/A;  posterior   ORIF FACIAL FRACTURE  1988   PROSTATECTOMY  2012   Patient Active Problem List   Diagnosis Date Noted   Acute non-recurrent maxillary sinusitis 01/28/2021   Callus of foot 03/30/2019   Seborrhea 03/30/2019   GAD (generalized anxiety disorder) 10/01/2018   Hypothyroidism 10/01/2018   Rotator cuff tear arthropathy of right shoulder 04/29/2017   Essential hypertension 03/17/2017   Type 1 diabetes mellitus without complication ([place]) 23/53/6144   Personal history of prostate cancer 08/12/2016   Chronic allergic rhinitis due to pollen 08/12/2016   Degenerative joint disease involving multiple joints 08/12/2016    PCP: Claretta Fraise, MD  REFERRING PROVIDER: Vallarie Mare, MD  REFERRING DIAG: Radiculopathy, lumbar region  Rationale for Evaluation  and Treatment Rehabilitation  THERAPY DIAG:  Radiculopathy, lumbar region  ONSET DATE: 5-6 weeks ago   SUBJECTIVE:                                                                                                                                                                                           SUBJECTIVE STATEMENT: Patient reports that he feels like his symptoms are getting worse. He had an injection on Tuesday, but this has not helped his symptoms.  PERTINENT HISTORY:  Prior lumbar surgery, HTN,  DM, history of cancer (prostate)   PAIN:  Are you having pain? Yes: NPRS scale: 7-8/10 Pain location: back radiating down RLE Pain description: throbbing and numbness Aggravating factors: walking (<5 minutes), standing, bending Relieving factors: sitting, heat,    PRECAUTIONS: None  WEIGHT BEARING RESTRICTIONS No  FALLS:  Has patient fallen in last 6 months? No  LIVING ENVIRONMENT: Lives with: lives with their family Lives in: House/apartment Stairs: Yes: External: 3 steps; on right going up Has following equipment at home: Single point cane  OCCUPATION: not working currently  PLOF: Independent  NEXT MD FOLLOW UP: 05/13/22  PATIENT GOALS: reduced pain, stand and walk longer, return to work    OBJECTIVE: assessments were performed on 04/22/22 at his initial evaluation unless otherwise noted  DIAGNOSTIC FINDINGS: MRI on 03/31/22 Lumbar spondylosis and degenerative disc disease, causing moderate to prominent impingement at L4-5 primarily due to a right lateral recess disc protrusion in conjunction with intervertebral and facet spurring and disc bulge.  SCREENING FOR RED FLAGS: Bowel or bladder incontinence: No, but he feels like he is constipated Spinal tumors: No Cauda equina syndrome: No Compression fracture: No Abdominal aneurysm: No  COGNITION:  Overall cognitive status: Within functional limits for tasks assessed     SENSATION: Light touch: Impaired  and  diminished sensation throughout RLE with no dermatomal pattern  POSTURE: rounded shoulders, forward head, decreased lumbar lordosis, and increased thoracic kyphosis  PALPATION: assessed in sitting due to positional intolerance TTP: Lumbar paraspinal and QL   LUMBAR ROM: unable to be safely assessed at this time due to pain and instability with standing  LOWER EXTREMITY MMT:    MMT Right eval Right 05/16/22 Left eval Left 05/16/22  Hip flexion 4-/5 4/5 4/5 4/5  Hip extension      Hip abduction      Hip adduction      Hip internal rotation      Hip external rotation      Knee flexion 4-/5 3+/5 4/5 4-/5  Knee extension 3/5 with right leg pain  3+/5 with back and right leg pain 3+/5 4-/5  Ankle dorsiflexion 3/5 3/5 3+/5 3+/5  Ankle plantarflexion      Ankle inversion      Ankle eversion       (Blank rows = not tested)  LUMBAR SPECIAL TESTS:  Slump test: Positive  GAIT: Assistive device utilized: None Level of assistance: CGA Comments: lumbar flexion, decreased gait speed, absent hip extension, and required external support with ambulation    TODAY'S TREATMENT                                    8/25 EXERCISE LOG  Exercise Repetitions and Resistance Comments  Nustep  L3 x 15 minutes                    Blank cell = exercise not performed today                                    8/21 EXERCISE LOG  Exercise Repetitions and Resistance Comments  Nustep L3 x 20 minutes                    Blank cell = exercise not performed today  Manual Therapy Soft Tissue Mobilization: bilateral lumbar paraspinals, for reduced pain and  tone   Modalities  Date:  Unattended Estim: bilateral lumbar paraspinals, IFC @ 80-150 Hz w/ 40% scan, 15 mins, Pain and Tone                                   8/14 EXERCISE LOG  Exercise Repetitions and Resistance Comments  Nustep L4 x 17 minutes   Lower trunk rotation 30 reps   Glute sets  Attempted, but limited by low back pain   Supine  march Alternating LE; 20 reps each   Bent knee fall out Alternating LE; 3 minutes   Hip adduction isometric (supine) 5 second hold x 2 minutes    Blank cell = exercise not performed today  Modalities: no redness or adverse reactions to today's modalities  Date:  Unattended Estim: bilateral lumbar paraspinals, IFC @ 80-150 Hz w/ 40% scan, 15 mins, Pain    PATIENT EDUCATION:  Education details: progress with therapy, following up his with his physician, benefits of continued mobility Person educated: Patient Education method: Explanation Education comprehension: verbalized understanding   HOME EXERCISE PROGRAM:   ASSESSMENT:  CLINICAL IMPRESSION: Patient has made minimal progress with skilled physical therapy as evidenced by his subjective reports, objective measures, and progress toward his goals. He continues to be limited by increased low back pain with pain and tingling radiating down both lower extremities. He reported feeling comfortable being discharged at this time. Recommend that he follow up with his physician for additional medical interventions due to his lack of significant progress with skilled physical therapy.   PHYSICAL THERAPY DISCHARGE SUMMARY  Visits from Start of Care: 7  Current functional level related to goals / functional outcomes: Patient was unable to meet his goals for skilled physical therapy.    Remaining deficits: No significant improvements since his initial evaluation   Education / Equipment: HEP    Patient agrees to discharge. Patient goals were not met. Patient is being discharged due to lack of progress.   OBJECTIVE IMPAIRMENTS Abnormal gait, decreased activity tolerance, decreased balance, decreased mobility, difficulty walking, decreased ROM, decreased strength, hypomobility, impaired sensation, and pain.   ACTIVITY LIMITATIONS carrying, lifting, bending, sitting, standing, transfers, and locomotion level  PARTICIPATION LIMITATIONS:  shopping and community activity  PERSONAL FACTORS Time since onset of injury/illness/exacerbation and 3+ comorbidities: Prior lumbar surgery, HTN, DM, history of cancer (prostate)   are also affecting patient's functional outcome.   REHAB POTENTIAL: Fair    CLINICAL DECISION MAKING: Unstable/unpredictable  EVALUATION COMPLEXITY: High   GOALS: Goals reviewed with patient? Yes  LONG TERM GOALS: Target date: 05/20/2022  Patient will be independent with his HEP.  Baseline: not providing any change to his symptoms Goal status: MET  2.  Patient will be able to complete his daily activities with his familiar pain exceeding 6/10. Baseline:  Goal status: NOT MET  3.  Patient will be able to stand and walk for at least 15 minutes without being limited by his familiar pain.  Baseline: about 5 minutes Goal status: NOT MET  4.  Patient will improve his RLE manual muscle testing to at least equal to his LLE for improved function with his daily activities.  Baseline: see objective section Goal status: NOT MET   PLAN: PT FREQUENCY: 2x/week  PT DURATION: 4 weeks  PLANNED INTERVENTIONS: Therapeutic exercises, Therapeutic activity, Neuromuscular re-education, Balance training, Gait training, Patient/Family education, Self Care, Joint mobilization, Electrical stimulation, Spinal mobilization, Cryotherapy, Moist  heat, Manual therapy, and Re-evaluation.  PLAN FOR NEXT SESSION: nustep, lumbar stability, light AROM, and modalities as needed    Darlin Coco, PT 05/16/2022, 8:57 AM

## 2022-05-27 ENCOUNTER — Telehealth: Payer: Self-pay | Admitting: Family Medicine

## 2022-05-27 ENCOUNTER — Other Ambulatory Visit: Payer: Self-pay | Admitting: Neurosurgery

## 2022-05-27 NOTE — Telephone Encounter (Signed)
I completed it and put it in my outbox on Thursday 8/31 - so it would go in before the weekend

## 2022-05-28 NOTE — Telephone Encounter (Signed)
SPOKE WITH PATIENT AND HE REPORTS FORM WAS RECEIVED AND SURGERY SCHEDULED

## 2022-06-05 NOTE — Pre-Procedure Instructions (Signed)
Surgical Instructions    Your procedure is scheduled on Wednesday, September 20th.  Report to [facility] Main Entrance "A" at 10:30 A.M., then check in with the Admitting office.  Call this number if you have problems the morning of surgery:  [phone]   If you have any questions prior to your surgery date call [phone]: Open Monday-Friday 8am-4pm    Remember:  Do not eat or drink after midnight the night before your surgery     Take these medicines the morning of surgery with A SIP OF WATER  atorvastatin (LIPITOR)  cetirizine (ZYRTEC) gabapentin (NEURONTIN)  levothyroxine (SYNTHROID) pantoprazole (PROTONIX)   As of today, STOP taking any Aspirin (unless otherwise instructed by your surgeon) Aleve, Naproxen, Ibuprofen, Motrin, Advil, Goody's, BC's, all herbal medications, fish oil, and all vitamins.  WHAT DO I DO ABOUT MY DIABETES MEDICATION?   Do not take metFORMIN (GLUCOPHAGE-XR) the morning of surgery.  THE NIGHT BEFORE SURGERY, take 20 units (50%) of insulin glargine, 2 Unit Dial, (TOUJEO MAX SOLOSTAR).    THE MORNING OF SURGERY, take 20 units (50%) of insulin glargine, 2 Unit Dial, (TOUJEO MAX SOLOSTAR).   If your CBG is greater than 220 mg/dL, you may take  of your insulin lispro (HUMALOG) the morning of surgery.   HOW TO MANAGE YOUR DIABETES BEFORE AND AFTER SURGERY  Why is it important to control my blood sugar before and after surgery? Improving blood sugar levels before and after surgery helps healing and can limit problems. A way of improving blood sugar control is eating a healthy diet by:  Eating less sugar and carbohydrates  Increasing activity/exercise  Talking with your doctor about reaching your blood sugar goals High blood sugars (greater than 180 mg/dL) can raise your risk of infections and slow your recovery, so you will need to focus on controlling your diabetes during the weeks before surgery. Make sure that the doctor who takes care of  your diabetes knows about your planned surgery including the date and location.  How do I manage my blood sugar before surgery? Check your blood sugar at least 4 times a day, starting 2 days before surgery, to make sure that the level is not too high or low.  Check your blood sugar the morning of your surgery when you wake up and every 2 hours until you get to the Short Stay unit.  If your blood sugar is less than 70 mg/dL, you will need to treat for low blood sugar: Do not take insulin. Treat a low blood sugar (less than 70 mg/dL) with  cup of clear juice (cranberry or apple), 4 glucose tablets, OR glucose gel. Recheck blood sugar in 15 minutes after treatment (to make sure it is greater than 70 mg/dL). If your blood sugar is not greater than 70 mg/dL on recheck, call [phone] for further instructions. Report your blood sugar to the short stay nurse when you get to Short Stay.  If you are admitted to the hospital after surgery: Your blood sugar will be checked by the staff and you will probably be given insulin after surgery (instead of oral diabetes medicines) to make sure you have good blood sugar levels. The goal for blood sugar control after surgery is 80-180 mg/dL.                     Do NOT Smoke (Tobacco/Vaping) for 24 hours prior to your procedure.  If you use a CPAP at night, you may bring your  mask/headgear for your overnight stay.   Contacts, glasses, piercing's, hearing aid's, dentures or partials may not be worn into surgery, please bring cases for these belongings.    For patients admitted to the hospital, discharge time will be determined by your treatment team.   Patients discharged the day of surgery will not be allowed to drive home, and someone needs to stay with them for 24 hours.  SURGICAL WAITING ROOM VISITATION Patients having surgery or a procedure may have no more than 2 support people in the waiting area - these visitors may rotate.   Children under the  age of 17 must have an adult with them who is not the patient. If the patient needs to stay at the hospital during part of their recovery, the visitor guidelines for inpatient rooms apply. Pre-op nurse will coordinate an appropriate time for 1 support person to accompany patient in pre-op.  This support person may not rotate.   Please refer to the [facility] website for the visitor guidelines for Inpatients (after your surgery is over and you are in a regular room).    Special instructions:   [place]- Preparing For Surgery  Before surgery, you can play an important role. Because skin is not sterile, your skin needs to be as free of germs as possible. You can reduce the number of germs on your skin by washing with CHG (chlorahexidine gluconate) Soap before surgery.  CHG is an antiseptic cleaner which kills germs and bonds with the skin to continue killing germs even after washing.    Oral Hygiene is also important to reduce your risk of infection.  Remember - BRUSH YOUR TEETH THE MORNING OF SURGERY WITH YOUR REGULAR TOOTHPASTE  Please do not use if you have an allergy to CHG or antibacterial soaps. If your skin becomes reddened/irritated stop using the CHG.  Do not shave (including legs and underarms) for at least 48 hours prior to first CHG shower. It is OK to shave your face.  Please follow these instructions carefully.   Shower the NIGHT BEFORE SURGERY and the MORNING OF SURGERY  If you chose to wash your hair, wash your hair first as usual with your normal shampoo.  After you shampoo, rinse your hair and body thoroughly to remove the shampoo.  Use CHG Soap as you would any other liquid soap. You can apply CHG directly to the skin and wash gently with a scrungie or a clean washcloth.   Apply the CHG Soap to your body ONLY FROM THE NECK DOWN.  Do not use on open wounds or open sores. Avoid contact with your eyes, ears, mouth and genitals (private parts). Wash Face and genitals  (private parts)  with your normal soap.   Wash thoroughly, paying special attention to the area where your surgery will be performed.  Thoroughly rinse your body with warm water from the neck down.  DO NOT shower/wash with your normal soap after using and rinsing off the CHG Soap.  Pat yourself dry with a CLEAN TOWEL.  Wear CLEAN PAJAMAS to bed the night before surgery  Place CLEAN SHEETS on your bed the night before your surgery  DO NOT SLEEP WITH PETS.   Day of Surgery: Take a shower with CHG soap. Do not wear jewelry  Do not wear lotions, powders, colognes, or deodorant. Men may shave face and neck. Do not bring valuables to the hospital. [facility] is not responsible for any belongings or valuables.  Wear Clean/Comfortable clothing the  morning of surgery Remember to brush your teeth WITH YOUR REGULAR TOOTHPASTE.   Please read over the following fact sheets that you were given.    If you received a COVID test during your pre-op visit  it is requested that you wear a mask when out in public, stay away from anyone that may not be feeling well and notify your surgeon if you develop symptoms. If you have been in contact with anyone that has tested positive in the last 10 days please notify you surgeon.

## 2022-06-06 ENCOUNTER — Encounter (HOSPITAL_COMMUNITY)
Admission: RE | Admit: 2022-06-06 | Discharge: 2022-06-06 | Disposition: A | Discharge status: Home / Self Care | Payer: 59 | Source: Ambulatory Visit | Attending: Neurosurgery | Admitting: Neurosurgery

## 2022-06-06 ENCOUNTER — Encounter (HOSPITAL_COMMUNITY): Payer: Self-pay

## 2022-06-06 ENCOUNTER — Other Ambulatory Visit: Payer: Self-pay | Admitting: Neurosurgery

## 2022-06-06 ENCOUNTER — Other Ambulatory Visit: Payer: Self-pay

## 2022-06-06 VITALS — BP 157/71 | HR 63 | Temp 98.0°F | Resp 18 | Ht 69.0 in | Wt 192.4 lb

## 2022-06-06 DIAGNOSIS — E109 Type 1 diabetes mellitus without complications: Secondary | ICD-10-CM

## 2022-06-06 DIAGNOSIS — I10 Essential (primary) hypertension: Secondary | ICD-10-CM

## 2022-06-06 DIAGNOSIS — Z01818 Encounter for other preprocedural examination: Secondary | ICD-10-CM

## 2022-06-06 LAB — COMPREHENSIVE METABOLIC PANEL
ALT: 34 U/L (ref 0–44)
AST: 33 U/L (ref 15–41)
Albumin: 4.2 g/dL (ref 3.5–5.0)
Alkaline Phosphatase: 39 U/L (ref 38–126)
Anion gap: 9 (ref 5–15)
BUN: 15 mg/dL (ref 8–23)
CO2: 25 mmol/L (ref 22–32)
Calcium: 10.1 mg/dL (ref 8.9–10.3)
Chloride: 103 mmol/L (ref 98–111)
Creatinine, Ser: 0.86 mg/dL (ref 0.61–1.24)
GFR, Estimated: >60 mL/min (ref >60)
Glucose, Bld: 229 mg/dL — ABNORMAL HIGH (ref 70–99)
Potassium: 4.5 mmol/L (ref 3.5–5.1)
Sodium: 137 mmol/L (ref 135–145)
Total Bilirubin: 2.1 mg/dL — ABNORMAL HIGH (ref 0.3–1.2)
Total Protein: 7.5 g/dL (ref 6.5–8.1)

## 2022-06-06 LAB — CBC
HCT: 42.8 % (ref 39.0–52.0)
Hemoglobin: 14.9 g/dL (ref 13.0–17.0)
MCH: 32.1 pg (ref 26.0–34.0)
MCHC: 34.8 g/dL (ref 30.0–36.0)
MCV: 92.2 fL (ref 80.0–100.0)
Platelets: 287 10*3/uL (ref 150–400)
RBC: 4.64 MIL/uL (ref 4.22–5.81)
RDW: 12.6 % (ref 11.5–15.5)
WBC: 6.6 10*3/uL (ref 4.0–10.5)
nRBC: 0 % (ref 0.0–0.2)

## 2022-06-06 LAB — HEMOGLOBIN A1C
Hgb A1c MFr Bld: 7.9 % — ABNORMAL HIGH (ref 4.8–5.6)
Mean Plasma Glucose: 180.03 mg/dL

## 2022-06-06 LAB — SURGICAL PCR SCREEN
MRSA, PCR: NEGATIVE
Staphylococcus aureus: NEGATIVE

## 2022-06-06 LAB — TYPE AND SCREEN
ABO/RH(D): A NEG
Antibody Screen: NEGATIVE

## 2022-06-06 LAB — GLUCOSE, CAPILLARY: Glucose-Capillary: 212 mg/dL — ABNORMAL HIGH (ref 70–99)

## 2022-06-06 NOTE — Progress Notes (Signed)
Order for CBC not put in initially. Lab called and was able to add on a CBC, spoke with Terrance in the lab.

## 2022-06-06 NOTE — Progress Notes (Signed)
PCP - Claretta Fraise Cardiologist - Denies  PPM/ICD - Denies  Chest x-ray - NI EKG - 06/06/22 Stress Test - Long time ago, around age 61 ECHO - Denies Cardiac Cath - Denies  Sleep Study - Denies  DM Type I CBG at PAT appt 212 Fasting Blood Sugar - 80-200 Checks Blood Sugar __4___ times a day  Blood Thinner Instructions:Denies Aspirin Instructions:Denies  Anesthesia review: No  Patient denies shortness of breath, fever, cough and chest pain at PAT appointment   All instructions explained to the patient, with a verbal understanding of the material. Patient agrees to go over the instructions while at home for a better understanding.  The opportunity to ask questions was provided.

## 2022-06-11 ENCOUNTER — Ambulatory Visit (HOSPITAL_COMMUNITY): Payer: 59

## 2022-06-11 ENCOUNTER — Ambulatory Visit (HOSPITAL_COMMUNITY): Payer: 59 | Admitting: Critical Care Medicine

## 2022-06-11 ENCOUNTER — Ambulatory Visit (HOSPITAL_COMMUNITY)
Admission: RE | Disposition: A | Discharge status: Home / Self Care | Payer: Self-pay | Source: Home / Self Care | Attending: Neurosurgery

## 2022-06-11 ENCOUNTER — Observation Stay (HOSPITAL_COMMUNITY)
Admission: RE | Admit: 2022-06-11 | Discharge: 2022-06-12 | Disposition: A | Discharge status: Home / Self Care | Payer: 59 | Attending: Neurosurgery | Admitting: Neurosurgery

## 2022-06-11 ENCOUNTER — Encounter (HOSPITAL_COMMUNITY): Payer: Self-pay | Admitting: Critical Care Medicine

## 2022-06-11 ENCOUNTER — Other Ambulatory Visit: Payer: Self-pay

## 2022-06-11 DIAGNOSIS — Z794 Long term (current) use of insulin: Secondary | ICD-10-CM | POA: Insufficient documentation

## 2022-06-11 DIAGNOSIS — M5416 Radiculopathy, lumbar region: Principal | ICD-10-CM | POA: Diagnosis present

## 2022-06-11 DIAGNOSIS — E039 Hypothyroidism, unspecified: Secondary | ICD-10-CM | POA: Diagnosis not present

## 2022-06-11 DIAGNOSIS — M5116 Intervertebral disc disorders with radiculopathy, lumbar region: Secondary | ICD-10-CM

## 2022-06-11 DIAGNOSIS — E109 Type 1 diabetes mellitus without complications: Secondary | ICD-10-CM | POA: Diagnosis not present

## 2022-06-11 DIAGNOSIS — Z8546 Personal history of malignant neoplasm of prostate: Secondary | ICD-10-CM | POA: Insufficient documentation

## 2022-06-11 DIAGNOSIS — Z91040 Latex allergy status: Secondary | ICD-10-CM | POA: Insufficient documentation

## 2022-06-11 DIAGNOSIS — T8489XA Other specified complication of internal orthopedic prosthetic devices, implants and grafts, initial encounter: Secondary | ICD-10-CM | POA: Diagnosis not present

## 2022-06-11 DIAGNOSIS — Z7984 Long term (current) use of oral hypoglycemic drugs: Secondary | ICD-10-CM | POA: Diagnosis not present

## 2022-06-11 DIAGNOSIS — Z79899 Other long term (current) drug therapy: Secondary | ICD-10-CM | POA: Insufficient documentation

## 2022-06-11 DIAGNOSIS — Y831 Surgical operation with implant of artificial internal device as the cause of abnormal reaction of the patient, or of later complication, without mention of misadventure at the time of the procedure: Secondary | ICD-10-CM | POA: Diagnosis not present

## 2022-06-11 DIAGNOSIS — Z87891 Personal history of nicotine dependence: Secondary | ICD-10-CM | POA: Insufficient documentation

## 2022-06-11 HISTORY — PX: TRANSFORAMINAL LUMBAR INTERBODY FUSION W/ MIS 1 LEVEL: SHX6145

## 2022-06-11 LAB — GLUCOSE, CAPILLARY
Glucose-Capillary: 176 mg/dL — ABNORMAL HIGH (ref 70–99)
Glucose-Capillary: 102 mg/dL — ABNORMAL HIGH (ref 70–99)
Glucose-Capillary: 92 mg/dL (ref 70–99)
Glucose-Capillary: 86 mg/dL (ref 70–99)
Glucose-Capillary: 94 mg/dL (ref 70–99)
Glucose-Capillary: 102 mg/dL — ABNORMAL HIGH (ref 70–99)
Glucose-Capillary: 216 mg/dL — ABNORMAL HIGH (ref 70–99)
Glucose-Capillary: 291 mg/dL — ABNORMAL HIGH (ref 70–99)

## 2022-06-11 SURGERY — MINIMALLY INVASIVE (MIS) TRANSFORAMINAL LUMBAR INTERBODY FUSION (TLIF) 1 LEVEL
Anesthesia: General | Laterality: Right

## 2022-06-11 MED ORDER — HYDROMORPHONE HCL 1 MG/ML IJ SOLN
INTRAMUSCULAR | Status: AC
  Filled 2022-06-11: qty 1

## 2022-06-11 MED ORDER — ACETAMINOPHEN 10 MG/ML IV SOLN
INTRAVENOUS | Status: AC
  Filled 2022-06-11: qty 100

## 2022-06-11 MED ORDER — FENTANYL CITRATE (PF) 250 MCG/5ML IJ SOLN
INTRAMUSCULAR | Status: DC | PRN
  Administered 2022-06-11: 50 ug via INTRAVENOUS
  Administered 2022-06-11: 150 ug via INTRAVENOUS
  Administered 2022-06-11: 50 ug via INTRAVENOUS

## 2022-06-11 MED ORDER — ATORVASTATIN CALCIUM 10 MG PO TABS
10.0000 mg | ORAL_TABLET | Freq: Every day | ORAL | Status: DC
  Administered 2022-06-12: 10 mg via ORAL
  Filled 2022-06-11: qty 1

## 2022-06-11 MED ORDER — MIDAZOLAM HCL 5 MG/5ML IJ SOLN
INTRAMUSCULAR | Status: DC | PRN
  Administered 2022-06-11: 2 mg via INTRAVENOUS

## 2022-06-11 MED ORDER — ROCURONIUM BROMIDE 10 MG/ML (PF) SYRINGE
PREFILLED_SYRINGE | INTRAVENOUS | Status: DC | PRN
  Administered 2022-06-11: 80 mg via INTRAVENOUS

## 2022-06-11 MED ORDER — CHLORHEXIDINE GLUCONATE 0.12 % MT SOLN
OROMUCOSAL | Status: AC
  Administered 2022-06-11: 15 mL via OROMUCOSAL
  Filled 2022-06-11: qty 15

## 2022-06-11 MED ORDER — ACETAMINOPHEN 160 MG/5ML PO SOLN: 1000.0000 mg | Freq: Once | ORAL | Status: DC | PRN

## 2022-06-11 MED ORDER — PANTOPRAZOLE SODIUM 40 MG PO TBEC
40.0000 mg | DELAYED_RELEASE_TABLET | Freq: Every day | ORAL | Status: DC
  Administered 2022-06-12: 40 mg via ORAL
  Filled 2022-06-11: qty 1

## 2022-06-11 MED ORDER — DEXAMETHASONE SODIUM PHOSPHATE 10 MG/ML IJ SOLN
INTRAMUSCULAR | Status: AC
  Filled 2022-06-11: qty 1

## 2022-06-11 MED ORDER — LIDOCAINE-EPINEPHRINE 1 %-1:100000 IJ SOLN
INTRAMUSCULAR | Status: DC | PRN
  Administered 2022-06-11: 15 mL

## 2022-06-11 MED ORDER — 0.9 % SODIUM CHLORIDE (POUR BTL) OPTIME
TOPICAL | Status: DC | PRN
  Administered 2022-06-11: 1000 mL

## 2022-06-11 MED ORDER — INSULIN ASPART 100 UNIT/ML IJ SOLN: 0.0000 [IU] | INTRAMUSCULAR | Status: DC | PRN

## 2022-06-11 MED ORDER — HYDRALAZINE HCL 20 MG/ML IJ SOLN
5.0000 mg | Freq: Once | INTRAMUSCULAR | Status: AC
  Administered 2022-06-11: 5 mg via INTRAVENOUS

## 2022-06-11 MED ORDER — DEXAMETHASONE SODIUM PHOSPHATE 10 MG/ML IJ SOLN
INTRAMUSCULAR | Status: DC | PRN
  Administered 2022-06-11: 4 mg via INTRAVENOUS

## 2022-06-11 MED ORDER — BUPIVACAINE LIPOSOME 1.3 % IJ SUSP
INTRAMUSCULAR | Status: AC
  Filled 2022-06-11: qty 20

## 2022-06-11 MED ORDER — SODIUM CHLORIDE 0.9 % IV SOLN: 250.0000 mL | INTRAVENOUS | Status: DC

## 2022-06-11 MED ORDER — THROMBIN 5000 UNITS EX SOLR
CUTANEOUS | Status: AC
  Filled 2022-06-11: qty 5000

## 2022-06-11 MED ORDER — HYDRALAZINE HCL 20 MG/ML IJ SOLN
INTRAMUSCULAR | Status: AC
  Filled 2022-06-11: qty 1

## 2022-06-11 MED ORDER — LACTATED RINGERS IV SOLN: INTRAVENOUS | Status: DC

## 2022-06-11 MED ORDER — POLYMYXIN B-TRIMETHOPRIM 10000-0.1 UNIT/ML-% OP SOLN: 1.0000 [drp] | Freq: Four times a day (QID) | OPHTHALMIC | Status: DC

## 2022-06-11 MED ORDER — HYDROMORPHONE HCL 1 MG/ML IJ SOLN
0.2500 mg | INTRAMUSCULAR | Status: DC | PRN
  Administered 2022-06-11 (×4): 0.5 mg via INTRAVENOUS

## 2022-06-11 MED ORDER — LABETALOL HCL 5 MG/ML IV SOLN
10.0000 mg | INTRAVENOUS | Status: AC | PRN
  Administered 2022-06-11 (×2): 10 mg via INTRAVENOUS

## 2022-06-11 MED ORDER — THROMBIN 5000 UNITS EX SOLR
OROMUCOSAL | Status: DC | PRN
  Administered 2022-06-11 (×2): 5 mL via TOPICAL

## 2022-06-11 MED ORDER — ACETAMINOPHEN 10 MG/ML IV SOLN: 1000.0000 mg | Freq: Once | INTRAVENOUS | Status: DC | PRN

## 2022-06-11 MED ORDER — OXYCODONE HCL 5 MG PO TABS
5.0000 mg | ORAL_TABLET | ORAL | Status: DC | PRN
  Administered 2022-06-11: 5 mg via ORAL

## 2022-06-11 MED ORDER — ONDANSETRON HCL 4 MG PO TABS: 4.0000 mg | ORAL_TABLET | Freq: Four times a day (QID) | ORAL | Status: DC | PRN

## 2022-06-11 MED ORDER — METFORMIN HCL ER 500 MG PO TB24
1000.0000 mg | ORAL_TABLET | Freq: Every day | ORAL | Status: DC
  Administered 2022-06-12: 1000 mg via ORAL
  Filled 2022-06-11: qty 2

## 2022-06-11 MED ORDER — ACETAMINOPHEN 10 MG/ML IV SOLN
INTRAVENOUS | Status: DC | PRN
  Administered 2022-06-11: 1000 mg via INTRAVENOUS

## 2022-06-11 MED ORDER — HYDROMORPHONE HCL 1 MG/ML IJ SOLN
INTRAMUSCULAR | Status: AC
  Filled 2022-06-11: qty 0.5

## 2022-06-11 MED ORDER — SUGAMMADEX SODIUM 200 MG/2ML IV SOLN
INTRAVENOUS | Status: DC | PRN
  Administered 2022-06-11: 200 mg via INTRAVENOUS

## 2022-06-11 MED ORDER — KETOROLAC TROMETHAMINE 0.5 % OP SOLN
OPHTHALMIC | Status: AC
  Administered 2022-06-11: 1 [drp]
  Filled 2022-06-11: qty 5

## 2022-06-11 MED ORDER — PROPOFOL 10 MG/ML IV BOLUS
INTRAVENOUS | Status: AC
  Filled 2022-06-11: qty 20

## 2022-06-11 MED ORDER — OXYCODONE HCL 5 MG PO TABS: 5.0000 mg | ORAL_TABLET | Freq: Once | ORAL | Status: DC | PRN

## 2022-06-11 MED ORDER — LEVOTHYROXINE SODIUM 25 MCG PO TABS
25.0000 ug | ORAL_TABLET | Freq: Every day | ORAL | Status: DC
  Administered 2022-06-12: 25 ug via ORAL
  Filled 2022-06-11: qty 1

## 2022-06-11 MED ORDER — LISINOPRIL 20 MG PO TABS
20.0000 mg | ORAL_TABLET | Freq: Every day | ORAL | Status: DC
  Administered 2022-06-11: 20 mg via ORAL
  Filled 2022-06-11: qty 1

## 2022-06-11 MED ORDER — DOCUSATE SODIUM 100 MG PO CAPS
100.0000 mg | ORAL_CAPSULE | Freq: Two times a day (BID) | ORAL | Status: DC
  Administered 2022-06-11 – 2022-06-12 (×2): 100 mg via ORAL
  Filled 2022-06-11 (×2): qty 1

## 2022-06-11 MED ORDER — PROPOFOL 10 MG/ML IV BOLUS
INTRAVENOUS | Status: DC | PRN
  Administered 2022-06-11: 160 mg via INTRAVENOUS

## 2022-06-11 MED ORDER — GABAPENTIN 300 MG PO CAPS
300.0000 mg | ORAL_CAPSULE | Freq: Three times a day (TID) | ORAL | Status: DC
  Administered 2022-06-11 – 2022-06-12 (×2): 300 mg via ORAL
  Filled 2022-06-11 (×2): qty 1

## 2022-06-11 MED ORDER — CLOBETASOL PROPIONATE 0.05 % EX SOLN
1.0000 | Freq: Two times a day (BID) | CUTANEOUS | Status: DC | PRN
  Filled 2022-06-11: qty 1

## 2022-06-11 MED ORDER — BUPIVACAINE HCL (PF) 0.5 % IJ SOLN
INTRAMUSCULAR | Status: AC
  Filled 2022-06-11: qty 30

## 2022-06-11 MED ORDER — FENTANYL CITRATE (PF) 100 MCG/2ML IJ SOLN
25.0000 ug | INTRAMUSCULAR | Status: DC | PRN
  Administered 2022-06-11 (×3): 50 ug via INTRAVENOUS

## 2022-06-11 MED ORDER — ACETAMINOPHEN 650 MG RE SUPP: 650.0000 mg | RECTAL | Status: DC | PRN

## 2022-06-11 MED ORDER — FENTANYL CITRATE (PF) 100 MCG/2ML IJ SOLN
INTRAMUSCULAR | Status: AC
  Filled 2022-06-11: qty 2

## 2022-06-11 MED ORDER — DEXMEDETOMIDINE HCL IN NACL 80 MCG/20ML IV SOLN
INTRAVENOUS | Status: AC
  Filled 2022-06-11: qty 20

## 2022-06-11 MED ORDER — ONDANSETRON HCL 4 MG/2ML IJ SOLN
INTRAMUSCULAR | Status: DC | PRN
  Administered 2022-06-11: 4 mg via INTRAVENOUS

## 2022-06-11 MED ORDER — LORATADINE 10 MG PO TABS
10.0000 mg | ORAL_TABLET | Freq: Every day | ORAL | Status: DC
  Administered 2022-06-12: 10 mg via ORAL
  Filled 2022-06-11: qty 1

## 2022-06-11 MED ORDER — PHENOL 1.4 % MT LIQD: 1.0000 | OROMUCOSAL | Status: DC | PRN

## 2022-06-11 MED ORDER — CEFAZOLIN SODIUM-DEXTROSE 2-4 GM/100ML-% IV SOLN
INTRAVENOUS | Status: AC
  Filled 2022-06-11: qty 100

## 2022-06-11 MED ORDER — HYDROCHLOROTHIAZIDE 12.5 MG PO TABS
12.5000 mg | ORAL_TABLET | Freq: Every day | ORAL | Status: DC
  Administered 2022-06-11: 12.5 mg via ORAL
  Filled 2022-06-11: qty 1

## 2022-06-11 MED ORDER — CHLORHEXIDINE GLUCONATE 0.12 % MT SOLN: 15.0000 mL | Freq: Once | OROMUCOSAL | Status: AC

## 2022-06-11 MED ORDER — PHENYLEPHRINE 80 MCG/ML (10ML) SYRINGE FOR IV PUSH (FOR BLOOD PRESSURE SUPPORT)
PREFILLED_SYRINGE | INTRAVENOUS | Status: DC | PRN
  Administered 2022-06-11 (×2): 80 ug via INTRAVENOUS

## 2022-06-11 MED ORDER — FLEET ENEMA 7-19 GM/118ML RE ENEM: 1.0000 | ENEMA | Freq: Once | RECTAL | Status: DC | PRN

## 2022-06-11 MED ORDER — ORAL CARE MOUTH RINSE: 15.0000 mL | Freq: Once | OROMUCOSAL | Status: AC

## 2022-06-11 MED ORDER — OXYCODONE HCL 5 MG PO TABS
ORAL_TABLET | ORAL | Status: AC
  Filled 2022-06-11: qty 1

## 2022-06-11 MED ORDER — ONDANSETRON HCL 4 MG/2ML IJ SOLN: 4.0000 mg | Freq: Four times a day (QID) | INTRAMUSCULAR | Status: DC | PRN

## 2022-06-11 MED ORDER — ROCURONIUM BROMIDE 10 MG/ML (PF) SYRINGE
PREFILLED_SYRINGE | INTRAVENOUS | Status: AC
  Filled 2022-06-11: qty 10

## 2022-06-11 MED ORDER — PHENYLEPHRINE HCL-NACL 20-0.9 MG/250ML-% IV SOLN
INTRAVENOUS | Status: DC | PRN
  Administered 2022-06-11: 25 ug/min via INTRAVENOUS

## 2022-06-11 MED ORDER — FENTANYL CITRATE (PF) 250 MCG/5ML IJ SOLN
INTRAMUSCULAR | Status: AC
  Filled 2022-06-11: qty 5

## 2022-06-11 MED ORDER — CHLORHEXIDINE GLUCONATE CLOTH 2 % EX PADS: 6.0000 | MEDICATED_PAD | Freq: Once | CUTANEOUS | Status: DC

## 2022-06-11 MED ORDER — ALPRAZOLAM 0.5 MG PO TABS
0.7500 mg | ORAL_TABLET | Freq: Every evening | ORAL | Status: DC | PRN
  Administered 2022-06-11: 0.75 mg via ORAL
  Filled 2022-06-11: qty 2

## 2022-06-11 MED ORDER — HYDROMORPHONE HCL 1 MG/ML IJ SOLN
INTRAMUSCULAR | Status: DC | PRN
  Administered 2022-06-11 (×2): .25 mg via INTRAVENOUS

## 2022-06-11 MED ORDER — POLYETHYLENE GLYCOL 3350 17 G PO PACK: 17.0000 g | PACK | Freq: Every day | ORAL | Status: DC | PRN

## 2022-06-11 MED ORDER — TRIAMCINOLONE ACETONIDE 0.1 % EX CREA: 1.0000 | TOPICAL_CREAM | Freq: Two times a day (BID) | CUTANEOUS | Status: DC | PRN

## 2022-06-11 MED ORDER — MIDAZOLAM HCL 2 MG/2ML IJ SOLN
INTRAMUSCULAR | Status: AC
  Filled 2022-06-11: qty 2

## 2022-06-11 MED ORDER — SODIUM CHLORIDE 0.9% FLUSH: 3.0000 mL | Freq: Two times a day (BID) | INTRAVENOUS | Status: DC

## 2022-06-11 MED ORDER — LISINOPRIL-HYDROCHLOROTHIAZIDE 20-12.5 MG PO TABS: 1.0000 | ORAL_TABLET | Freq: Every day | ORAL | Status: DC

## 2022-06-11 MED ORDER — ACETAMINOPHEN 500 MG PO TABS: 1000.0000 mg | ORAL_TABLET | Freq: Once | ORAL | Status: DC | PRN

## 2022-06-11 MED ORDER — OXYCODONE HCL 5 MG PO TABS
10.0000 mg | ORAL_TABLET | ORAL | Status: DC | PRN
  Administered 2022-06-11 – 2022-06-12 (×5): 10 mg via ORAL
  Filled 2022-06-11 (×5): qty 2

## 2022-06-11 MED ORDER — MENTHOL 3 MG MT LOZG: 1.0000 | LOZENGE | OROMUCOSAL | Status: DC | PRN

## 2022-06-11 MED ORDER — BSS IO SOLN: 15.0000 mL | Freq: Once | INTRAOCULAR | Status: DC

## 2022-06-11 MED ORDER — ONDANSETRON HCL 4 MG/2ML IJ SOLN
INTRAMUSCULAR | Status: AC
  Filled 2022-06-11: qty 2

## 2022-06-11 MED ORDER — TRAZODONE HCL 50 MG PO TABS: 50.0000 mg | ORAL_TABLET | Freq: Every evening | ORAL | Status: DC | PRN

## 2022-06-11 MED ORDER — CYCLOBENZAPRINE HCL 10 MG PO TABS
10.0000 mg | ORAL_TABLET | Freq: Three times a day (TID) | ORAL | Status: DC | PRN
  Administered 2022-06-12: 10 mg via ORAL
  Filled 2022-06-11: qty 1

## 2022-06-11 MED ORDER — LIDOCAINE 2% (20 MG/ML) 5 ML SYRINGE
INTRAMUSCULAR | Status: AC
  Filled 2022-06-11: qty 5

## 2022-06-11 MED ORDER — INSULIN GLARGINE-YFGN 100 UNIT/ML ~~LOC~~ SOLN
40.0000 [IU] | Freq: Two times a day (BID) | SUBCUTANEOUS | Status: DC
  Administered 2022-06-11 – 2022-06-12 (×2): 40 [IU] via SUBCUTANEOUS
  Filled 2022-06-11 (×3): qty 0.4

## 2022-06-11 MED ORDER — BUPIVACAINE HCL (PF) 0.5 % IJ SOLN
INTRAMUSCULAR | Status: DC | PRN
  Administered 2022-06-11: 30 mL

## 2022-06-11 MED ORDER — ACETAMINOPHEN 325 MG PO TABS
650.0000 mg | ORAL_TABLET | ORAL | Status: DC | PRN
  Administered 2022-06-11 – 2022-06-12 (×2): 650 mg via ORAL
  Filled 2022-06-11 (×2): qty 2

## 2022-06-11 MED ORDER — POTASSIUM CHLORIDE IN NACL 20-0.9 MEQ/L-% IV SOLN: INTRAVENOUS | Status: DC

## 2022-06-11 MED ORDER — ADULT MULTIVITAMIN W/MINERALS CH
1.0000 | ORAL_TABLET | Freq: Every day | ORAL | Status: DC
  Administered 2022-06-11 – 2022-06-12 (×2): 1 via ORAL
  Filled 2022-06-11 (×2): qty 1

## 2022-06-11 MED ORDER — KETOROLAC TROMETHAMINE 0.5 % OP SOLN: 1.0000 [drp] | Freq: Four times a day (QID) | OPHTHALMIC | Status: DC

## 2022-06-11 MED ORDER — MORPHINE SULFATE (PF) 2 MG/ML IV SOLN: 1.0000 mg | INTRAVENOUS | Status: DC | PRN

## 2022-06-11 MED ORDER — CEFAZOLIN SODIUM-DEXTROSE 1-4 GM/50ML-% IV SOLN
1.0000 g | Freq: Four times a day (QID) | INTRAVENOUS | Status: DC
  Administered 2022-06-11 – 2022-06-12 (×2): 1 g via INTRAVENOUS
  Filled 2022-06-11 (×2): qty 50

## 2022-06-11 MED ORDER — DEXMEDETOMIDINE HCL IN NACL 80 MCG/20ML IV SOLN
INTRAVENOUS | Status: DC | PRN
  Administered 2022-06-11: 4 ug via BUCCAL
  Administered 2022-06-11: 8 ug via BUCCAL

## 2022-06-11 MED ORDER — BUPIVACAINE LIPOSOME 1.3 % IJ SUSP
INTRAMUSCULAR | Status: DC | PRN
  Administered 2022-06-11: 20 mL

## 2022-06-11 MED ORDER — CEFAZOLIN SODIUM-DEXTROSE 2-4 GM/100ML-% IV SOLN
2.0000 g | INTRAVENOUS | Status: AC
  Administered 2022-06-11: 2 g via INTRAVENOUS

## 2022-06-11 MED ORDER — OXYCODONE HCL 5 MG/5ML PO SOLN: 5.0000 mg | Freq: Once | ORAL | Status: DC | PRN

## 2022-06-11 MED ORDER — INSULIN LISPRO (1 UNIT DIAL) 100 UNIT/ML (KWIKPEN): 10.0000 [IU] | PEN_INJECTOR | Freq: Three times a day (TID) | SUBCUTANEOUS | Status: DC

## 2022-06-11 MED ORDER — LIDOCAINE-EPINEPHRINE 1 %-1:100000 IJ SOLN
INTRAMUSCULAR | Status: AC
  Filled 2022-06-11: qty 1

## 2022-06-11 MED ORDER — LABETALOL HCL 5 MG/ML IV SOLN
INTRAVENOUS | Status: AC
  Filled 2022-06-11: qty 4

## 2022-06-11 MED ORDER — SODIUM CHLORIDE 0.9% FLUSH: 3.0000 mL | INTRAVENOUS | Status: DC | PRN

## 2022-06-11 SURGICAL SUPPLY — 86 items
ADH SKN CLS APL DERMABOND .7 (GAUZE/BANDAGES/DRESSINGS) ×2
BAG COUNTER SPONGE SURGICOUNT (BAG) ×1 IMPLANT
BAG SPNG CNTER NS LX DISP (BAG) ×1
BAND INSRT 18 STRL LF DISP RB (MISCELLANEOUS) ×2
BAND RUBBER #18 3X1/16 STRL (MISCELLANEOUS) IMPLANT
BASKET BONE COLLECTION (BASKET) IMPLANT
BLADE BONE MILL MEDIUM (MISCELLANEOUS) IMPLANT
BLADE SURG 11 STRL SS (BLADE) ×1 IMPLANT
BUR 14 MATCH 3 (BUR) IMPLANT
BUR MATCHSTICK NEURO 3.0 LAGG (BURR) ×1 IMPLANT
BUR MR8 14 BALL 5 (BUR) IMPLANT
BUR PRECISION FLUTE 5.0 (BURR) ×1 IMPLANT
BUR SURGICAL HEAD PROX 3X12.5 (BURR) IMPLANT
BURR 14 MATCH 3 (BUR) ×1
BURR MR8 14 BALL 5 (BUR)
BURR SURGICAL HEAD PROX 3X12.5 (BURR) ×1
CANISTER SUCT 3000ML PPV (MISCELLANEOUS) ×1 IMPLANT
CNTNR URN SCR LID CUP LEK RST (MISCELLANEOUS) ×1 IMPLANT
CONT SPEC 4OZ STRL OR WHT (MISCELLANEOUS) ×1
COVER BACK TABLE 60X90IN (DRAPES) ×1 IMPLANT
COVERAGE SUPPORT O-ARM STEALTH (MISCELLANEOUS) ×1 IMPLANT
DERMABOND ADVANCED .7 DNX12 (GAUZE/BANDAGES/DRESSINGS) ×2 IMPLANT
DRAPE 3/4 80X56 (DRAPES) ×1 IMPLANT
DRAPE C-ARM 42X72 X-RAY (DRAPES) ×1 IMPLANT
DRAPE C-ARMOR (DRAPES) ×1 IMPLANT
DRAPE LAPAROTOMY 100X72X124 (DRAPES) ×1 IMPLANT
DRAPE MICROSCOPE SLANT 54X150 (MISCELLANEOUS) ×1 IMPLANT
DRAPE SHEET LG 3/4 BI-LAMINATE (DRAPES) ×4 IMPLANT
DRSG OPSITE POSTOP 3X4 (GAUZE/BANDAGES/DRESSINGS) IMPLANT
DURAPREP 26ML APPLICATOR (WOUND CARE) ×1 IMPLANT
ELECT BLADE INSULATED 4IN (ELECTROSURGICAL) ×1
ELECT BLADE INSULATED 6.5IN (ELECTROSURGICAL) ×2
ELECT COATED BLADE 2.86 ST (ELECTRODE) ×1 IMPLANT
ELECT REM PT RETURN 9FT ADLT (ELECTROSURGICAL) ×1
ELECTRODE BLADE INSULATED 4IN (ELECTROSURGICAL) IMPLANT
ELECTRODE BLDE INSULATED 6.5IN (ELECTROSURGICAL) ×1 IMPLANT
ELECTRODE REM PT RTRN 9FT ADLT (ELECTROSURGICAL) ×1 IMPLANT
EXTENDER TAB GUIDE SV 5.5/6.0 (INSTRUMENTS) IMPLANT
FEE COVERAGE SUPPORT O-ARM (MISCELLANEOUS) IMPLANT
GAUZE 4X4 16PLY ~~LOC~~+RFID DBL (SPONGE) IMPLANT
GLOVE BIOGEL PI IND STRL 7.0 (GLOVE) IMPLANT
GLOVE BIOGEL PI IND STRL 7.5 (GLOVE) ×1 IMPLANT
GLOVE SURG SS PI 7.0 STRL IVOR (GLOVE) IMPLANT
GLOVE SURG SS PI 7.5 STRL IVOR (GLOVE) IMPLANT
GLOVE SURG SS PI 8.0 STRL IVOR (GLOVE) IMPLANT
GLOVE SURG UNDER POLY LF SZ8.5 (GLOVE) ×1 IMPLANT
GOWN STRL REUS W/ TWL LRG LVL3 (GOWN DISPOSABLE) ×1 IMPLANT
GOWN STRL REUS W/ TWL XL LVL3 (GOWN DISPOSABLE) ×2 IMPLANT
GOWN STRL REUS W/TWL LRG LVL3 (GOWN DISPOSABLE) ×1
GOWN STRL REUS W/TWL XL LVL3 (GOWN DISPOSABLE) ×2
HEMOSTAT POWDER KIT SURGIFOAM (HEMOSTASIS) ×1 IMPLANT
KIT BASIN OR (CUSTOM PROCEDURE TRAY) ×1 IMPLANT
KIT INFUSE XX SMALL 0.7CC (Orthopedic Implant) IMPLANT
KIT TURNOVER KIT B (KITS) ×1 IMPLANT
KNIFE SURG 188MM BAYONET LONG (INSTRUMENTS) IMPLANT
MARKER SPHERE PSV REFLC NDI (MISCELLANEOUS) ×5 IMPLANT
MILL BONE PREP (MISCELLANEOUS) ×1 IMPLANT
NDL HYPO 21X1.5 SAFETY (NEEDLE) IMPLANT
NDL SPNL 18GX3.5 QUINCKE PK (NEEDLE) IMPLANT
NEEDLE HYPO 21X1.5 SAFETY (NEEDLE) ×2 IMPLANT
NEEDLE HYPO 22GX1.5 SAFETY (NEEDLE) ×1 IMPLANT
NEEDLE SPNL 18GX3.5 QUINCKE PK (NEEDLE) ×1 IMPLANT
NS IRRIG 1000ML POUR BTL (IV SOLUTION) ×1 IMPLANT
PACK LAMINECTOMY NEURO (CUSTOM PROCEDURE TRAY) ×1 IMPLANT
PAD ARMBOARD 7.5X6 YLW CONV (MISCELLANEOUS) ×3 IMPLANT
PIN BONE FIX 100 (PIN) IMPLANT
PUTTY DBF GRAFT 12CC W/DELIVE (Putty) IMPLANT
ROD 5.5 CCM PERC 40 (Rod) IMPLANT
SCREW MAS VG 5.5X7.5X40 (Screw) IMPLANT
SCREW SET 5.5/6.0MM SOLERA (Screw) IMPLANT
SCREW SPINAL IFIX 6.5X45 (Screw) IMPLANT
SPACER TLIF CATALYFT 9X40 (Spacer) IMPLANT
SPIKE FLUID TRANSFER (MISCELLANEOUS) ×1 IMPLANT
SPONGE T-LAP 4X18 ~~LOC~~+RFID (SPONGE) IMPLANT
SUPPORT TECH COVERAGE MED NAV (MISCELLANEOUS) ×1
SUT MNCRL AB 4-0 PS2 18 (SUTURE) ×1 IMPLANT
SUT VIC AB 0 CT1 18XCR BRD8 (SUTURE) ×1 IMPLANT
SUT VIC AB 0 CT1 8-18 (SUTURE) ×2
SUT VIC AB 2-0 CP2 18 (SUTURE) ×1 IMPLANT
SYR 20CC LL (SYRINGE) IMPLANT
SYR 30ML LL (SYRINGE) ×1 IMPLANT
TOWEL GREEN STERILE (TOWEL DISPOSABLE) ×1 IMPLANT
TOWEL GREEN STERILE FF (TOWEL DISPOSABLE) ×1 IMPLANT
TRAY FOL W/BAG SLVR 16FR STRL (SET/KITS/TRAYS/PACK) IMPLANT
TRAY FOLEY W/BAG SLVR 16FR LF (SET/KITS/TRAYS/PACK) ×1
WATER STERILE IRR 1000ML POUR (IV SOLUTION) ×1 IMPLANT

## 2022-06-11 NOTE — Op Note (Signed)
PREOP DIAGNOSIS: Recurrent disc herniation with Lumbar radiculopathy  POSTOP DIAGNOSIS: Recurrent disc herniation with Lumbar radiculopathy  PROCEDURE: 1. L4-5 lumbar interbody fusion via right transforaminal approach with tubular retractors 2. Posterolateral arthrodesis, L4-5 3. Placement of interbody cage L4-5 4. Nonsegmental instrumentation with percutaneously placed pedicle screw and rod construct at L4-5 5. Harvest of local autograft 6. Use of morselized allograft 7. Use of microscope for microdissection 8.  ntraoperative neuronavigation with Stealth 9. Intraoperative CT scan  SURGEON: Dr. Duffy Rhody, MD  ASSISTANT: Weston Brass, NP Please note, there were no qualified trainees available to assist with the procedure.  An assistant was required for aid in retraction of the neural elements.   ANESTHESIA: General Endotracheal  EBL: 50 ml  IMPLANTS:  Medtronic 6.5 x 45 mm x2 at L4 7.5 x 40 mm x2 at L5 9 mm PL40 Long Catalyft cage  SPECIMENS: None  DRAINS: None  COMPLICATIONS: none  CONDITION: Stable to PACU  HISTORY: Charles Foley is a 61 y.o. male who initially presented to the outpatient clinic with hx of L4-5 posterior decompression who developed recurrent right leg pain as well as severe back pain. MRI showed recurrent right sided disc herniation with paracentral and foraminal herniation. Treatment options were discussed and the patient elected to proceed with MIS TLIF at L4-5. risks, benefits, alternatives, and expected convalescence were discussed with the patient.  Risks discussed included but were not limited to bleeding, pain, infection, scar, pseudoarthrosis, CSF leak, neurologic deficit, paralysis, and death.  The patient wished to proceed with surgery and informed consent was obtained.  PROCEDURE IN DETAIL: After informed consent was obtained and witnessed, the patient was brought to the operating room. After induction of general anesthesia, the patient was  positioned on the operative table in the prone position on a Jackson table with all pressure points meticulously padded. The skin of the low back was then prepped and draped in the usual sterile fashion.  A PSIS iliac spine was placed, and intraoperative CT obtained to allow for neuronavigation.  Using the Stealth navigation, paramedian incisions were planned for pedicle screw trajectories.  Incisions were made with a 10 blade.  Navigated high speed drill was used to cannulate the pedicles, and pedicles were then tapped using navigation.  Ball ended feeler confirmed good cannulation with no breaches.  On the side contralateral to the TLIF, appropriately sized pedicle screws were placed using navigation.   Navigated initial dilator was then docked on the L4-5 facet  on the right side.  Sequential dilators and final tubular retractor was placed and locked in position, with navigation confirming good placement.  The microscope was then introduced in the field to allow for intraoperative microdissection.  Remaining muscle was moved off of the degenerated facet joint.  The inferior facets was removed using an osteotome and harvested for autograft.  Overgrown superior facets was then removed with rongeurs.  The traversing nerve root was identified and good decompression was performed with removal of overgrown ligament and facets with rongeurs.  The exiting nerve root in the foramen was also seen.  Epidural veins in the foramen were coagulated and cut with allowed access to the disc space lateral to the traversing nerve root.  Scar was dissected off of the traversing nerve root.  Paracentral disc herniation was severely adherent to the underside of the nerve root and gentle dissection was required to free the nerve.  The disc was opened with 15 blade and pituitary rongeur was used to remove disc material.  Disc shavers of increasing size was then used to clean the disc space as well as to restore disc space height.  The  interbody space was prepared with rasps and curettes with removal of the cartilage from the endplates.  Appropriate sized interbody spacer was then sized.  The inner body space was packed with morselized allograft mixed with autograft as well as extra extra small BMP.  An expandable size 9 mm interbody spacer was then tamped into place with a mallet using navigation.  It was then expanded until snug with the endplates.   The wound was then irrigated thoroughly with bacitracin impregnated irrigation and meticulous hemostasis was obtained.  The retractor was then removed and meticulous hemostasis was obtained in the muscle layer and subcutaneous layer.  Remaining pedicle screws were then placed using navigation.  Good purchase was noted.  AAP and lateral x-ray confirmed good position of the implants.   Rods were then passed through the screw towers bilaterally and secured with screw caps.  X-ray confirmed good placement.  The screw caps were final tightened and the towers removed.  Navigated drill was used to decorticate the facet joint contralateral to the TLIF, and bone graft was placed in the facet and lateral gutter.  Wounds were irrigated thoroughly .  Exparel mixed with Marcaine was injected into the paraspinous muscles and subcutaneous tissues bilaterally.  The fascia was closed with 0 Vicryl stitches.  The dermal layer was closed with 2-0 Vicryl stitches in buried interrupted fashion.  The skin incisions were closed with 4-0 Monocryl subcuticular manner followed by Dermabond.  The PSIS pin was removed and small incision closed with 3-0 vicryl in buried fashion and Dermabond.  Sterile dressings were placed.  Patient was then flipped supine and extubated by the anesthesia service following commands and all 4 extremities.  All counts were correct at the end of surgery.  No complications were noted.

## 2022-06-11 NOTE — Anesthesia Procedure Notes (Signed)
Procedure Name: Intubation Date/Time: 06/11/2022 1:04 PM  Performed by: Wilburn Cornelia, CRNAPre-anesthesia Checklist: Patient identified, Emergency Drugs available, Suction available, Patient being monitored and Timeout performed Patient Re-evaluated:Patient Re-evaluated prior to induction Oxygen Delivery Method: Circle system utilized Preoxygenation: Pre-oxygenation with 100% oxygen Induction Type: IV induction Ventilation: Mask ventilation without difficulty Laryngoscope Size: Mac and 4 Grade View: Grade III Tube type: Oral Tube size: 7.5 mm Number of attempts: 1 Airway Equipment and Method: Stylet Placement Confirmation: ETT inserted through vocal cords under direct vision, positive ETCO2, CO2 detector and breath sounds checked- equal and bilateral Secured at: 23 cm Tube secured with: Tape Dental Injury: Teeth and Oropharynx as per pre-operative assessment

## 2022-06-11 NOTE — H&P (Signed)
CC: back pain, right leg pain  HPI:     Patient is a 61 y.o. male hx of L4-5 posterior decompression presents with back pain and right >left radicular pain.  He was found to have recurrent disc herniation and stenosis.    Patient Active Problem List   Diagnosis Date Noted   Acute non-recurrent maxillary sinusitis 01/28/2021   Callus of foot 03/30/2019   Seborrhea 03/30/2019   GAD (generalized anxiety disorder) 10/01/2018   Hypothyroidism 10/01/2018   Rotator cuff tear arthropathy of right shoulder 04/29/2017   Essential hypertension 03/17/2017   Type 1 diabetes mellitus without complication ([place]) 96/78/9381   Personal history of prostate cancer 08/12/2016   Chronic allergic rhinitis due to pollen 08/12/2016   Degenerative joint disease involving multiple joints 08/12/2016   Past Medical History:  Diagnosis Date   Acute pain of right knee 06/17/2017   Allergic rhinitis    Anxiety    Arthritis    Cancer ([place]) 2012   prostate   Chest pain 11/02/2014   atypical, no cardiac testing ordered per Dorris Carnes, MD 11/03/14   Diabetes mellitus without complication ([place])    type I   Hypertension    Hypothyroidism     Past Surgical History:  Procedure Laterality Date   COLONOSCOPY     KNEE ARTHROSCOPY Bilateral    LUMBAR LAMINECTOMY/DECOMPRESSION MICRODISCECTOMY N/A 10/11/2020   Procedure: LUMBAR FOUR -LUMBAR FIVE DECOMPRESSION;  Surgeon: Phylliss Bob, MD;  Location: [facility];  Service: Orthopedics;  Laterality: N/A;  posterior   ORIF FACIAL FRACTURE  1988   PROSTATECTOMY  2012   TONSILLECTOMY      Medications Prior to Admission  Medication Sig Dispense Refill Last Dose   ALPRAZolam (XANAX) 0.5 MG tablet Take 1.5 tablets (0.75 mg total) by mouth at bedtime as needed. for sleep 45 tablet 5 Past Month   Aspirin-Caffeine (BC FAST PAIN RELIEF PO) Take 1 packet by mouth daily as needed (pain).   Past Month   atorvastatin (LIPITOR) 10 MG tablet Take 1 tablet (10 mg total) by mouth  daily. 90 tablet 3 06/11/2022   cetirizine (ZYRTEC) 10 MG tablet Take 1 tablet (10 mg total) by mouth daily. 90 tablet 3 06/11/2022   clobetasol (TEMOVATE) 0.05 % external solution Apply 1 application topically 2 (two) times daily. (Patient taking differently: Apply 1 application  topically 2 (two) times daily as needed (psoriasis).) 50 mL 5 Past Month   gabapentin (NEURONTIN) 300 MG capsule Take 1 capsule (300 mg total) by mouth 3 (three) times daily. 90 capsule 1 06/11/2022   ibuprofen (ADVIL) 200 MG tablet Take 200 mg by mouth every 6 (six) hours as needed for moderate pain.   Past Month   insulin glargine, 2 Unit Dial, (TOUJEO MAX SOLOSTAR) 300 UNIT/ML Solostar Pen Inject 40 Units into the skin in the morning and at bedtime. 9 mL 11 06/11/2022 at 0730   insulin lispro (HUMALOG) 100 UNIT/ML KwikPen INJECT UP TO 20 UNITS 3 TIMES A DAY BEFORE MEALS (Patient taking differently: Inject 10-20 Units into the skin 3 (three) times daily before meals.) 15 mL 3 06/11/2022 at 0730   levothyroxine (SYNTHROID) 25 MCG tablet Take 1 tablet (25 mcg total) by mouth daily. 90 tablet 3 06/11/2022 at 0730   lisinopril-hydrochlorothiazide (ZESTORETIC) 20-12.5 MG tablet Take 1 tablet by mouth daily. 90 tablet 3 06/10/2022   metFORMIN (GLUCOPHAGE-XR) 500 MG 24 hr tablet Take 1,000 mg by mouth daily with breakfast.   06/10/2022   Multiple Vitamin (MULTIVITAMIN)  tablet Take 1 tablet by mouth daily.   Past Month   naproxen sodium (ALEVE) 220 MG tablet Take 220 mg by mouth daily as needed (pain).   Past Month   pantoprazole (PROTONIX) 40 MG tablet Take 1 tablet (40 mg total) by mouth daily. 90 tablet 3 06/11/2022   traZODone (DESYREL) 150 MG tablet Use from 1/3 to 1 tablet nightly as needed for sleep. 90 tablet 3 06/10/2022   triamcinolone cream (KENALOG) 0.1 % APPLY 1 APPLICATION TOPICALLY 2 TIMES DAILY (Patient taking differently: Apply 1 Application topically 2 (two) times daily as needed (rash).) 160 g 2 Past Month    cyclobenzaprine (FLEXERIL) 10 MG tablet TAKE ONE TABLET BY MOUTH THREE TIMES DAILY AS NEEDED FOR MUSCLE SPASMS (Patient not taking: Reported on 04/22/2022) 30 tablet 0 Completed Course   ibuprofen (ADVIL) 800 MG tablet Take 1 tablet (800 mg total) by mouth 3 (three) times daily. Take with food (Patient not taking: Reported on 06/03/2022) 21 tablet 0 Completed Course   Allergies  Allergen Reactions   Latex Rash    Social History   Tobacco Use   Smoking status: Former    Types: Cigarettes    Quit date: 04/10/2009    Years since quitting: 13.1   Smokeless tobacco: Former    Types: Chew    Quit date: 01/10/2003   Tobacco comments:    smoke off and on  Substance Use Topics   Alcohol use: Not Currently    Family History  Problem Relation Age of Onset   Diabetes Mother    Heart disease Mother    Colon cancer Neg Hx      Review of Systems Pertinent items are noted in HPI.  Objective:   Patient Vitals for the past 8 hrs:  BP Temp Pulse Resp SpO2 Height Weight  06/11/22 1021 (!) 153/70 98.1 F (36.7 C) 66 18 98 % '5\' 9"'$  (1.753 m) 82.6 kg   No intake/output data recorded. No intake/output data recorded.      General : Alert, cooperative, no distress, appears stated age   Head:  Normocephalic/atraumatic    Eyes: PERRL, conjunctiva/corneas clear, EOM's intact. Fundi could not be visualized Neck: Supple Chest:  Respirations unlabored Chest wall: no tenderness or deformity Heart: Regular rate and rhythm Abdomen: Soft, nontender and nondistended Extremities: warm and well-perfused Skin: normal turgor, color and texture Neurologic:  Alert, oriented x 3.  Eyes open spontaneously. PERRL, EOMI, VFC, no facial droop. V1-3 intact.  No dysarthria, tongue protrusion symmetric.  CNII-XII intact. Normal strength, sensation and reflexes throughout.  No pronator drift, full strength in legs except 4/5 R DF.  + SLR on right.       Data ReviewCBC:  Lab Results  Component Value Date   WBC  6.6 06/06/2022   RBC 4.64 06/06/2022   BMP:  Lab Results  Component Value Date   GLUCOSE 229 (H) 06/06/2022   CO2 25 06/06/2022   BUN 15 06/06/2022   BUN 18 02/24/2022   CREATININE 0.86 06/06/2022   CALCIUM 10.1 06/06/2022   Radiology review:  See clinc report for detail  Assessment:   Active Problems:   * No active hospital problems. *  Recurrent R L4-5 disc herniation/stenosis with back pain and radicular pain Plan:   - plan for R L4-5 MIS TLIF

## 2022-06-11 NOTE — Anesthesia Postprocedure Evaluation (Signed)
Anesthesia Post Note  Patient: Charles Foley  Procedure(s) Performed: MINIMALLY INVASIVE TRANSFORAMINAL LUMBAR INTERBODY FUSION LUMBAR FOUR- LUMBAR FIVE, RIGHT (Right) Application of O-Arm (Right)     Patient location during evaluation: PACU Anesthesia Type: General Level of consciousness: awake Pain management: pain level controlled Vital Signs Assessment: post-procedure vital signs reviewed and stable Respiratory status: spontaneous breathing Cardiovascular status: stable Postop Assessment: no apparent nausea or vomiting Anesthetic complications: no   No notable events documented.  Last Vitals:  Vitals:   06/11/22 1735 06/11/22 1750  BP: (!) 181/82 (!) 184/85  Pulse: 82 85  Resp: 16 11  Temp:    SpO2: 98% 93%    Last Pain:  Vitals:   06/11/22 1728  PainSc: 6                  [name]

## 2022-06-11 NOTE — Addendum Note (Signed)
Addendum  created 06/11/22 1849 by Suzette Battiest, MD   Order list changed, Pharmacy for encounter modified

## 2022-06-11 NOTE — Transfer of Care (Signed)
Immediate Anesthesia Transfer of Care Note  Patient: Charles Foley  Procedure(s) Performed: MINIMALLY INVASIVE TRANSFORAMINAL LUMBAR INTERBODY FUSION LUMBAR FOUR- LUMBAR FIVE, RIGHT (Right) Application of O-Arm (Right)  Patient Location: PACU  Anesthesia Type:General  Level of Consciousness: patient cooperative and responds to stimulation  Airway & Oxygen Therapy: Patient Spontanous Breathing  Post-op Assessment: Report given to RN and Post -op Vital signs reviewed and stable  Post vital signs: Reviewed and stable  Last Vitals:  Vitals Value Taken Time  BP 173/76 06/11/22 1705  Temp    Pulse 82 06/11/22 1707  Resp 12 06/11/22 1707  SpO2 93 % 06/11/22 1707  Vitals shown include unvalidated device data.  Last Pain:  Vitals:   06/11/22 1035  PainSc: 7          Complications: No notable events documented.

## 2022-06-11 NOTE — Anesthesia Preprocedure Evaluation (Signed)
Anesthesia Evaluation  Patient identified by MRN, date of birth, ID band Patient awake    Reviewed: Allergy & Precautions, NPO status , Patient's Chart, lab work & pertinent test results  History of Anesthesia Complications Negative for: history of anesthetic complications  Airway Mallampati: II  TM Distance: >3 FB Neck ROM: Full    Dental  (+) Poor Dentition, Chipped, Missing,    Pulmonary neg shortness of breath, neg sleep apnea, neg COPD, neg recent URI, former smoker,    breath sounds clear to auscultation       Cardiovascular hypertension, Pt. on medications (-) angina(-) Past MI and (-) CHF  Rhythm:Regular     Neuro/Psych PSYCHIATRIC DISORDERS Anxiety  Neuromuscular disease    GI/Hepatic Neg liver ROS, GERD  Medicated and Controlled,  Endo/Other  diabetes, Insulin DependentHypothyroidism   Renal/GU negative Renal ROSLab Results      Component                Value               Date                      CREATININE               0.86                06/06/2022                Musculoskeletal  (+) Arthritis ,   Abdominal   Peds  Hematology   Anesthesia Other Findings   Reproductive/Obstetrics                             Anesthesia Physical Anesthesia Plan  ASA: 2  Anesthesia Plan: General   Post-op Pain Management: Toradol IV (intra-op)* and Ofirmev IV (intra-op)*   Induction: Intravenous  PONV Risk Score and Plan: 2 and Ondansetron and Dexamethasone  Airway Management Planned: Oral ETT  Additional Equipment: None  Intra-op Plan:   Post-operative Plan: Extubation in OR  Informed Consent: I have reviewed the patients History and Physical, chart, labs and discussed the procedure including the risks, benefits and alternatives for the proposed anesthesia with the patient or authorized representative who has indicated his/her understanding and acceptance.     Dental advisory  given  Plan Discussed with: CRNA  Anesthesia Plan Comments:         Anesthesia Quick Evaluation

## 2022-06-12 ENCOUNTER — Encounter (HOSPITAL_COMMUNITY): Payer: Self-pay | Admitting: Neurosurgery

## 2022-06-12 DIAGNOSIS — M5116 Intervertebral disc disorders with radiculopathy, lumbar region: Secondary | ICD-10-CM | POA: Diagnosis not present

## 2022-06-12 LAB — GLUCOSE, CAPILLARY: Glucose-Capillary: 240 mg/dL — ABNORMAL HIGH (ref 70–99)

## 2022-06-12 MED ORDER — CYCLOBENZAPRINE HCL 10 MG PO TABS: 10.0000 mg | ORAL_TABLET | Freq: Three times a day (TID) | ORAL | 3 refills | Status: DC | PRN

## 2022-06-12 MED ORDER — INSULIN ASPART 100 UNIT/ML IJ SOLN
10.0000 [IU] | Freq: Three times a day (TID) | INTRAMUSCULAR | Status: DC
  Administered 2022-06-12: 10 [IU] via SUBCUTANEOUS

## 2022-06-12 MED ORDER — OXYCODONE-ACETAMINOPHEN 5-325 MG PO TABS: 1.0000 | ORAL_TABLET | ORAL | 0 refills | Status: DC | PRN

## 2022-06-12 MED FILL — Thrombin For Soln 5000 Unit: CUTANEOUS | Qty: 5000 | Status: AC

## 2022-06-12 NOTE — Discharge Instructions (Signed)
Wound Care  Leave incision open to air. You may shower. Do not scrub directly on incision.  Do not put any creams, lotions, or ointments on incision. Activity Walk each and every day, increasing distance each day. No lifting greater than 8 lbs.  Avoid excessive back bending No driving for 2 weeks; may ride as a passenger locally.  Diet Resume your normal diet.   Call Your Doctor If Any of These Occur Redness, drainage, or swelling at the wound.  Temperature greater than 101 degrees. Severe pain not relieved by pain medication. Increased difficulty swallowing. Incision starts to come apart. Follow Up Appt Call  [phone]) for problems.

## 2022-06-12 NOTE — Evaluation (Signed)
Occupational Therapy Evaluation Patient Details Name: Charles Foley MRN: [id number] DOB: March 29, 1961 Today's Date: 06/12/2022   History of Present Illness 61 y.o male s/p minimally invasive transforaminal lumbar interbody fusion L4-5 right 06/11/2022. PMH sigificant for hypothyroidism, generalized anxiety disorder, prostate cancer and type 1 diabetes.   Clinical Impression   PTA, pt was mod I and lived with his wife. Upon eval, pt performing LB ADL with mod A and UB ADL with min A for brace application. Pt educated and demonstrating use of compensatory techniques for LB dressing, brace application, tub transfer, bed mobility and oral care within precautions. All education provided; all questions answered. Recommend discharge home with no OT follow up.      Recommendations for follow up therapy are one component of a multi-disciplinary discharge planning process, led by the attending physician.  Recommendations may be updated based on patient status, additional functional criteria and insurance authorization.   Follow Up Recommendations  No OT follow up    Assistance Recommended at Discharge Intermittent Supervision/Assistance  Patient can return home with the following A little help with walking and/or transfers;A little help with bathing/dressing/bathroom;Assistance with cooking/housework;Assist for transportation;Help with stairs or ramp for entrance    Functional Status Assessment  Patient has had a recent decline in their functional status and demonstrates the ability to make significant improvements in function in a reasonable and predictable amount of time.  Equipment Recommendations  None recommended by OT (Pt reporting he has tub bench)    Recommendations for Other Services       Precautions / Restrictions Precautions Precautions: Back Precaution Booklet Issued: Yes (comment) Precaution Comments: Min cues for adherence Required Braces or Orthoses: Spinal Brace Spinal Brace:  Lumbar corset;Applied in sitting position Restrictions Weight Bearing Restrictions: No      Mobility Bed Mobility Overal bed mobility: Needs Assistance Bed Mobility: Rolling, Sidelying to Sit, Sit to Sidelying Rolling: Supervision Sidelying to sit: Supervision     Sit to sidelying: Min assist General bed mobility comments: pt able to verbalize and then demonstrate how to get in/out of bed; needed min assist for raising legs onto bed    Transfers Overall transfer level: Needs assistance Equipment used: Rolling walker (2 wheels), Straight cane Transfers: Sit to/from Stand Sit to Stand: Min guard           General transfer comment: cues for hand placement with RW      Balance Overall balance assessment: Mild deficits observed, not formally tested                                         ADL either performed or assessed with clinical judgement   ADL Overall ADL's : Needs assistance/impaired Eating/Feeding: Modified independent   Grooming: Wash/dry hands;Oral care;Min guard;Standing Grooming Details (indicate cue type and reason): min guard A for safety Upper Body Bathing: Set up;Sitting   Lower Body Bathing: Min guard;Sit to/from stand Lower Body Bathing Details (indicate cue type and reason): Pt reporting he can sit on the side of the tub at home. Education provided regarding safety and reporting he has a tub bench that he keeps outside by his fire pit. Pt reporting wife can bring it inside after education was provided. Upper Body Dressing : Sitting;Minimal assistance Upper Body Dressing Details (indicate cue type and reason): donning brace with min A Lower Body Dressing: Moderate assistance;Sit to/from stand Lower Body Dressing Details (indicate  cue type and reason): wife independent assisting Toilet Transfer: Min guard;Ambulation;Rolling walker (2 wheels);Regular Toilet   Toileting- Water quality scientist and Hygiene: Min guard;Sit to/from stand    Tub/ Shower Transfer: Tub transfer;Tub bench;Ambulation;Min guard Tub/Shower Transfer Details (indicate cue type and reason): min gbuard A for safety. Cues for compensatory techniques Functional mobility during ADLs: Min guard;Rolling walker (2 wheels) General ADL Comments: Min guard A for safety during standing tasks. Min cues for adherence to spinal precautions     Vision Baseline Vision/History: 0 No visual deficits Ability to See in Adequate Light: 0 Adequate Patient Visual Report: No change from baseline Vision Assessment?: No apparent visual deficits     Perception     Praxis      Pertinent Vitals/Pain Pain Assessment Pain Assessment: 0-10 Pain Score: 9  Pain Location: back Pain Descriptors / Indicators: Grimacing, Guarding Pain Intervention(s): Limited activity within patient's tolerance     Hand Dominance     Extremity/Trunk Assessment Upper Extremity Assessment Upper Extremity Assessment: Overall WFL for tasks assessed   Lower Extremity Assessment Lower Extremity Assessment: Generalized weakness   Cervical / Trunk Assessment Cervical / Trunk Assessment: Back Surgery   Communication Communication Communication: No difficulties   Cognition Arousal/Alertness: Awake/alert Behavior During Therapy: WFL for tasks assessed/performed Overall Cognitive Status: Within Functional Limits for tasks assessed                                 General Comments: Able to recall precautions after initial education     General Comments  wife present and independent assisting with LB dressing    Exercises     Shoulder Instructions      Home Living Family/patient expects to be discharged to:: Private residence Living Arrangements: Spouse/significant other Available Help at Discharge: Family Type of Home: Mobile home Home Access: Stairs to enter Technical brewer of Steps: 4 Entrance Stairs-Rails: Right;Left;Can reach both Home Layout: One level      Bathroom Shower/Tub: Teacher, early years/pre: Standard     Home Equipment: Cane - single point;Tub bench          Prior Functioning/Environment Prior Level of Function : Independent/Modified Independent             Mobility Comments: began using cane 3 mos ago due to leg pain ADLs Comments: independent but was becoming progressively more difficult with back pain        OT Problem List: Decreased strength;Decreased activity tolerance;Impaired balance (sitting and/or standing);Decreased range of motion;Decreased knowledge of precautions;Decreased knowledge of use of DME or AE;Pain      OT Treatment/Interventions:      OT Goals(Current goals can be found in the care plan section) Acute Rehab OT Goals Patient Stated Goal: take  shower OT Goal Formulation: With patient  OT Frequency:      Co-evaluation              AM-PAC OT "6 Clicks" Daily Activity     Outcome Measure Help from another person eating meals?: None Help from another person taking care of personal grooming?: A Little Help from another person toileting, which includes using toliet, bedpan, or urinal?: A Little Help from another person bathing (including washing, rinsing, drying)?: A Little Help from another person to put on and taking off regular upper body clothing?: A Little Help from another person to put on and taking off regular lower body clothing?: A Little 6 Click  Score: 19   End of Session Equipment Utilized During Treatment: Gait belt;Rolling walker (2 wheels) Nurse Communication: Mobility status  Activity Tolerance: Patient tolerated treatment well Patient left: in bed;with call bell/phone within reach;with family/visitor present  OT Visit Diagnosis: Unsteadiness on feet (R26.81);Muscle weakness (generalized) (M62.81);Pain Pain - part of body:  (operative site)                Time: 6803-2122 OT Time Calculation (min): 31 min Charges:  OT General Charges $OT Visit: 1  Visit OT Evaluation $OT Eval Low Complexity: 1 Low OT Treatments $Self Care/Home Management : 8-22 mins  Shanda Howells, OTR/L [facility] Acute Rehabilitation Office: [phone]   Lula Olszewski 06/12/2022, 10:54 AM

## 2022-06-12 NOTE — Evaluation (Signed)
Physical Therapy Evaluation and Discharge Patient Details Name: Charles Foley MRN: [id number] DOB: December 22, 1960 Today's Date: 06/12/2022  History of Present Illness  61 y.o male s/p minimally invasive transforaminal lumbar interbody fusion L4-5 right 06/11/2022. PMH sigificant for hypothyroidism, generalized anxiety disorder, prostate cancer and type 1 diabetes.  Clinical Impression   Patient evaluated by Physical Therapy with no further acute PT needs identified. All education has been completed and the patient has no further questions. Patient attempted use of cane (as he used PTA), however was slightly unsteady. Did well with RW and completed stair training. See below for any follow-up Physical Therapy or equipment needs. PT is signing off. Thank you for this referral.        Recommendations for follow up therapy are one component of a multi-disciplinary discharge planning process, led by the attending physician.  Recommendations may be updated based on patient status, additional functional criteria and insurance authorization.  Follow Up Recommendations No PT follow up      Assistance Recommended at Discharge Set up Supervision/Assistance  Patient can return home with the following  Help with stairs or ramp for entrance    Equipment Recommendations Rolling walker (2 wheels)  Recommendations for Other Services       Functional Status Assessment Patient has not had a recent decline in their functional status     Precautions / Restrictions Precautions Precautions: Back Precaution Booklet Issued: Yes (comment) Precaution Comments: pt required instruction on precautions and adherence to Required Braces or Orthoses: Spinal Brace Spinal Brace: Lumbar corset;Applied in sitting position      Mobility  Bed Mobility Overal bed mobility: Needs Assistance Bed Mobility: Rolling, Sidelying to Sit, Sit to Sidelying Rolling: Supervision Sidelying to sit: Supervision     Sit to sidelying:  Min assist General bed mobility comments: pt able to verbalize and then demonstrate how to get in/out of bed; needed min assist for raising legs onto bed    Transfers Overall transfer level: Needs assistance Equipment used: Rolling walker (2 wheels), Straight cane Transfers: Sit to/from Stand Sit to Stand: Min guard           General transfer comment: cues for hand placement with RW    Ambulation/Gait Ambulation/Gait assistance: Min guard Gait Distance (Feet): 150 Feet Assistive device: Rolling walker (2 wheels), Straight cane Gait Pattern/deviations: Step-through pattern, Decreased stride length       General Gait Details: initially used cane, but pt feeling unsteady and switched to RW with no unsteadiness noted; able to take longer steps with RW  Stairs Stairs: Yes Stairs assistance: Min guard Stair Management: One rail Left, Step to pattern, Forwards, With cane Number of Stairs: 3 General stair comments: appropriately used "strong" leg for ascend; required cues for leading down with "weaker" leg  Wheelchair Mobility    Modified Rankin (Stroke Patients Only)       Balance Overall balance assessment: Mild deficits observed, not formally tested                                           Pertinent Vitals/Pain Pain Assessment Pain Assessment: 0-10 Pain Score: 7  Pain Location: back Pain Descriptors / Indicators: Grimacing, Guarding Pain Intervention(s): Limited activity within patient's tolerance    Home Living Family/patient expects to be discharged to:: Private residence Living Arrangements: Spouse/significant other Available Help at Discharge: Family Type of Home: Mobile home Home Access:  Stairs to enter Entrance Stairs-Rails: Psychiatric nurse of Steps: 4   Home Layout: One level Home Equipment: Cane - single point;Tub bench      Prior Function Prior Level of Function : Independent/Modified Independent              Mobility Comments: began using cane 3 mos ago due to leg pain       Hand Dominance        Extremity/Trunk Assessment   Upper Extremity Assessment Upper Extremity Assessment: Defer to OT evaluation    Lower Extremity Assessment Lower Extremity Assessment: Generalized weakness (difficult to assess due to pain)    Cervical / Trunk Assessment Cervical / Trunk Assessment: Back Surgery  Communication   Communication: No difficulties  Cognition Arousal/Alertness: Awake/alert Behavior During Therapy: WFL for tasks assessed/performed Overall Cognitive Status: Within Functional Limits for tasks assessed                                          General Comments General comments (skin integrity, edema, etc.): wife present    Exercises     Assessment/Plan    PT Assessment Patient does not need any further PT services  PT Problem List         PT Treatment Interventions      PT Goals (Current goals can be found in the Care Plan section)  Acute Rehab PT Goals Patient Stated Goal: go home today PT Goal Formulation: All assessment and education complete, DC therapy    Frequency       Co-evaluation               AM-PAC PT "6 Clicks" Mobility  Outcome Measure Help needed turning from your back to your side while in a flat bed without using bedrails?: A Little Help needed moving from lying on your back to sitting on the side of a flat bed without using bedrails?: A Little Help needed moving to and from a bed to a chair (including a wheelchair)?: A Little Help needed standing up from a chair using your arms (e.g., wheelchair or bedside chair)?: A Little Help needed to walk in hospital room?: A Little Help needed climbing 3-5 steps with a railing? : A Little 6 Click Score: 18    End of Session Equipment Utilized During Treatment: Gait belt Activity Tolerance: Patient tolerated treatment well Patient left: in bed;with call bell/phone within  reach;with family/visitor present Nurse Communication: Mobility status;Other (comment) (needs RW; ok for discharge) PT Visit Diagnosis: Other abnormalities of gait and mobility (R26.89);Pain Pain - Right/Left:  (midline) Pain - part of body:  (back)    Time: 2482-5003 PT Time Calculation (min) (ACUTE ONLY): 22 min   Charges:   PT Evaluation $PT Eval Low Complexity: [place], PT Acute Rehabilitation Services  Office [phone]   Rexanne Mano 06/12/2022, 9:36 AM

## 2022-06-12 NOTE — Discharge Summary (Signed)
Physician Discharge Summary  Patient ID: [place] Kuang MRN: [id number] DOB/AGE: 63-Jul-1962 63 y.o.  Admit date: 06/11/2022 Discharge date: 06/12/2022  Admission Diagnoses: Recurrent disc herniation with Lumbar radiculopathy  Discharge Diagnoses: Recurrent disc herniation with Lumbar radiculopathy   Principal Problem:   Lumbar radiculopathy   Discharged Condition: good  Hospital Course: The patient was admitted on 06/11/2022 and taken to the operating room where the patient underwent right L4-5 MIS TLIF. The patient tolerated the procedure well and was taken to the recovery room and then to the floor in stable condition. The hospital course was routine. There were no complications. The wound remained clean dry and intact. Pt had appropriate back soreness. No complaints of leg pain or new N/T/W. The patient remained afebrile with stable vital signs, and tolerated a regular diet. The patient continued to increase activities, and pain was well controlled with oral pain medications.   Consults: None  Significant Diagnostic Studies: radiology: X-Ray: intraoperative   Treatments: surgery:  1. L4-5 lumbar interbody fusion via right transforaminal approach with tubular retractors 2. Posterolateral arthrodesis, L4-5 3. Placement of interbody cage L4-5 4. Nonsegmental instrumentation with percutaneously placed pedicle screw and rod construct at L4-5 5. Harvest of local autograft 6. Use of morselized allograft 7. Use of microscope for microdissection 8.  ntraoperative neuronavigation with Stealth 9. Intraoperative CT scan    Discharge Exam: Blood pressure (!) 100/52, pulse 77, temperature 98.4 F (36.9 C), temperature source Oral, resp. rate 16, height '5\' 9"'$  (1.753 m), weight 82.6 kg, SpO2 93 %. Physical Exam: Patient is awake, A/O X 4, conversant, and in good spirits. Eyes open spontaneously. They are in NAD and VSS. Doing well. Speech is fluent and appropriate. MAEW with good strength that  is symmetric bilaterally. Sensation to light touch is intact. PERLA, EOMI. CNs grossly intact. Dressings are clean dry intact. Incisions are well approximated with no drainage, erythema, or edema.     Disposition: Discharge disposition: 01-Home or Self Care       Discharge Instructions     Incentive spirometry RT   Complete by: As directed       Allergies as of 06/12/2022       Reactions   Latex Rash        Medication List     STOP taking these medications    ibuprofen 200 MG tablet Commonly known as: ADVIL   ibuprofen 800 MG tablet Commonly known as: ADVIL   naproxen sodium 220 MG tablet Commonly known as: ALEVE       TAKE these medications    ALPRAZolam 0.5 MG tablet Commonly known as: XANAX Take 1.5 tablets (0.75 mg total) by mouth at bedtime as needed. for sleep   atorvastatin 10 MG tablet Commonly known as: LIPITOR Take 1 tablet (10 mg total) by mouth daily.   BC FAST PAIN RELIEF PO Take 1 packet by mouth daily as needed (pain).   cetirizine 10 MG tablet Commonly known as: ZYRTEC Take 1 tablet (10 mg total) by mouth daily.   clobetasol 0.05 % external solution Commonly known as: TEMOVATE Apply 1 application topically 2 (two) times daily. What changed:  when to take this reasons to take this   cyclobenzaprine 10 MG tablet Commonly known as: FLEXERIL TAKE ONE TABLET BY MOUTH THREE TIMES DAILY AS NEEDED FOR MUSCLE SPASMS What changed: Another medication with the same name was added. Make sure you understand how and when to take each.   cyclobenzaprine 10 MG tablet Commonly known as: FLEXERIL  Take 1 tablet (10 mg total) by mouth 3 (three) times daily as needed for muscle spasms. What changed: You were already taking a medication with the same name, and this prescription was added. Make sure you understand how and when to take each.   gabapentin 300 MG capsule Commonly known as: NEURONTIN Take 1 capsule (300 mg total) by mouth 3 (three)  times daily.   insulin lispro 100 UNIT/ML KwikPen Commonly known as: HUMALOG INJECT UP TO 20 UNITS 3 TIMES A DAY BEFORE MEALS What changed:  how much to take how to take this when to take this additional instructions   levothyroxine 25 MCG tablet Commonly known as: SYNTHROID Take 1 tablet (25 mcg total) by mouth daily.   lisinopril-hydrochlorothiazide 20-12.5 MG tablet Commonly known as: Zestoretic Take 1 tablet by mouth daily.   metFORMIN 500 MG 24 hr tablet Commonly known as: GLUCOPHAGE-XR Take 1,000 mg by mouth daily with breakfast.   multivitamin tablet Take 1 tablet by mouth daily.   oxyCODONE-acetaminophen 5-325 MG tablet Commonly known as: Percocet Take 1 tablet by mouth every 4 (four) hours as needed for severe pain.   pantoprazole 40 MG tablet Commonly known as: PROTONIX Take 1 tablet (40 mg total) by mouth daily.   Toujeo Max SoloStar 300 UNIT/ML Solostar Pen Generic drug: insulin glargine (2 Unit Dial) Inject 40 Units into the skin in the morning and at bedtime.   traZODone 150 MG tablet Commonly known as: DESYREL Use from 1/3 to 1 tablet nightly as needed for sleep.   triamcinolone cream 0.1 % Commonly known as: KENALOG APPLY 1 APPLICATION TOPICALLY 2 TIMES DAILY What changed:  how much to take when to take this reasons to take this         Signed: Marvis Moeller 06/12/2022, 12:47 PM

## 2022-06-12 NOTE — Progress Notes (Signed)
Patient alert and oriented, mae's well, voiding adequate amount of urine, swallowing without difficulty, no c/o pain at time of discharge. Patient discharged home with family. Script and discharged instructions given to patient. Patient and family stated understanding of instructions given. Patient has an appointment with Dr. Thomas ?

## 2022-06-13 ENCOUNTER — Encounter (HOSPITAL_COMMUNITY): Payer: Self-pay | Admitting: Neurosurgery

## 2022-06-13 ENCOUNTER — Telehealth: Payer: Self-pay | Admitting: Family Medicine

## 2022-06-13 NOTE — Telephone Encounter (Signed)
Pts daughter called stating that pt was supposed to be prescribed Percocet for pain but instead was given Vicodin and says the vicodin is not helping. Wants to know if the Percocet Rx can be sent to [place] on [facility] in [place] because they have the Rx in [place].   Since Dr Livia Snellen is out, can covering provider approve? Pt had major back surgery on Wednesday and is in a lot of pain.  Please advise and call daughter.

## 2022-07-19 ENCOUNTER — Other Ambulatory Visit: Payer: Self-pay | Admitting: Family Medicine

## 2022-07-19 DIAGNOSIS — E109 Type 1 diabetes mellitus without complications: Secondary | ICD-10-CM

## 2022-09-01 ENCOUNTER — Ambulatory Visit: Payer: 59 | Admitting: Family Medicine

## 2022-09-01 ENCOUNTER — Other Ambulatory Visit: Payer: Self-pay | Admitting: Family Medicine

## 2022-09-01 DIAGNOSIS — F411 Generalized anxiety disorder: Secondary | ICD-10-CM

## 2022-09-26 ENCOUNTER — Other Ambulatory Visit: Payer: Self-pay | Admitting: Family Medicine

## 2022-10-01 DIAGNOSIS — I10 Essential (primary) hypertension: Secondary | ICD-10-CM | POA: Diagnosis not present

## 2022-10-01 DIAGNOSIS — E1065 Type 1 diabetes mellitus with hyperglycemia: Secondary | ICD-10-CM | POA: Diagnosis not present

## 2022-10-01 DIAGNOSIS — Z794 Long term (current) use of insulin: Secondary | ICD-10-CM | POA: Diagnosis not present

## 2022-10-01 DIAGNOSIS — Z7985 Long-term (current) use of injectable non-insulin antidiabetic drugs: Secondary | ICD-10-CM | POA: Diagnosis not present

## 2022-10-02 ENCOUNTER — Telehealth: Payer: Self-pay | Admitting: Pharmacist

## 2022-10-02 NOTE — Telephone Encounter (Signed)
PA SUBMITTED PLEASE F/U

## 2022-10-08 NOTE — Telephone Encounter (Signed)
Can someone please f/u on this  Want to make sure patient has insulin What product do we need to switch to if not covered?   MR,70 NON-FORMULARY DRUG, CONTACT PRESCRIBER(PHARMACY HELP DESK [phone])

## 2022-10-09 ENCOUNTER — Other Ambulatory Visit (HOSPITAL_COMMUNITY): Payer: Self-pay

## 2022-10-09 NOTE — Telephone Encounter (Signed)
Placed a call to [employer] to see which medications were on the formulary.   Per representative, Basaglar, Levemir and Tyler Aas are on his formulary.   Phone # [phone]

## 2022-10-13 ENCOUNTER — Other Ambulatory Visit (HOSPITAL_COMMUNITY): Payer: Self-pay

## 2022-10-14 ENCOUNTER — Ambulatory Visit (INDEPENDENT_AMBULATORY_CARE_PROVIDER_SITE_OTHER): Payer: 59 | Admitting: Family Medicine

## 2022-10-14 ENCOUNTER — Encounter: Payer: Self-pay | Admitting: Family Medicine

## 2022-10-14 VITALS — BP 123/60 | HR 68 | Temp 98.3°F | Ht 69.0 in | Wt 189.4 lb

## 2022-10-14 DIAGNOSIS — Z23 Encounter for immunization: Secondary | ICD-10-CM | POA: Diagnosis not present

## 2022-10-14 DIAGNOSIS — Z125 Encounter for screening for malignant neoplasm of prostate: Secondary | ICD-10-CM | POA: Diagnosis not present

## 2022-10-14 DIAGNOSIS — E559 Vitamin D deficiency, unspecified: Secondary | ICD-10-CM

## 2022-10-14 DIAGNOSIS — Z0000 Encounter for general adult medical examination without abnormal findings: Secondary | ICD-10-CM | POA: Diagnosis not present

## 2022-10-14 DIAGNOSIS — E109 Type 1 diabetes mellitus without complications: Secondary | ICD-10-CM

## 2022-10-14 DIAGNOSIS — E039 Hypothyroidism, unspecified: Secondary | ICD-10-CM

## 2022-10-14 DIAGNOSIS — E782 Mixed hyperlipidemia: Secondary | ICD-10-CM | POA: Diagnosis not present

## 2022-10-14 DIAGNOSIS — T148XXA Other injury of unspecified body region, initial encounter: Secondary | ICD-10-CM

## 2022-10-14 DIAGNOSIS — I10 Essential (primary) hypertension: Secondary | ICD-10-CM

## 2022-10-14 DIAGNOSIS — R69 Illness, unspecified: Secondary | ICD-10-CM | POA: Diagnosis not present

## 2022-10-14 DIAGNOSIS — F411 Generalized anxiety disorder: Secondary | ICD-10-CM

## 2022-10-14 DIAGNOSIS — M5136 Other intervertebral disc degeneration, lumbar region: Secondary | ICD-10-CM | POA: Diagnosis not present

## 2022-10-14 LAB — URINALYSIS
Bilirubin, UA: NEGATIVE
Glucose, UA: NEGATIVE
Ketones, UA: NEGATIVE
Leukocytes,UA: NEGATIVE
Nitrite, UA: NEGATIVE
Protein,UA: NEGATIVE
RBC, UA: NEGATIVE
Specific Gravity, UA: 1.02 (ref 1.005–1.030)
Urobilinogen, Ur: 0.2 mg/dL (ref 0.2–1.0)
pH, UA: 5.5 (ref 5.0–7.5)

## 2022-10-14 LAB — LIPID PANEL

## 2022-10-14 LAB — BAYER DCA HB A1C WAIVED: HB A1C (BAYER DCA - WAIVED): 7.8 % — ABNORMAL HIGH (ref 4.8–5.6)

## 2022-10-14 MED ORDER — ALPRAZOLAM 0.5 MG PO TABS: 0.7500 mg | ORAL_TABLET | Freq: Every evening | ORAL | 5 refills | Status: DC | PRN

## 2022-10-14 MED ORDER — PSEUDOEPHEDRINE-GUAIFENESIN ER 120-1200 MG PO TB12: 1.0000 | ORAL_TABLET | Freq: Two times a day (BID) | ORAL | 5 refills | Status: DC

## 2022-10-14 NOTE — Progress Notes (Signed)
Subjective:  Patient ID: Charles Foley,  male    DOB: 15-Nov-1960  Age: 63 y.o.    CC: Medical Management of Chronic Issues   HPI Charles Foley presents for  follow-up of hypertension. Patient has no history of headache chest pain or shortness of breath or recent cough. Patient also denies symptoms of TIA such as numbness weakness lateralizing. Patient denies side effects from medication. States taking it regularly.  Patient also  in for follow-up of elevated cholesterol. Doing well without complaints on current medication. Denies side effects  including myalgia and arthralgia and nausea. Also in today for liver function testing. Currently no chest pain, shortness of breath or other cardiovascular related symptoms noted.  Follow-up of diabetes. Patient does check blood sugar at home. 7.3 on A1c last week. Patient denies symptoms such as excessive hunger or urinary frequency, excessive hunger, nausea No significant hypoglycemic spells noted. Medications reviewed. Pt reports taking them regularly. Pt. denies complication/adverse reaction today.    follow-up on  thyroid. The patient has a history of hypothyroidism for many years. It has been stable recently. Pt. denies any change in  voice, loss of hair, heat or cold intolerance. Energy level has been adequate to good. Patient denies constipation and diarrhea. No myxedema. Medication is as noted below. Verified that pt is taking it daily on an empty stomach. Well tolerated.    History Charles Foley has a past medical history of Acute pain of right knee (06/17/2017), Allergic rhinitis, Anxiety, Arthritis, Cancer ([place]) (2012), Chest pain (11/02/2014), Diabetes mellitus without complication ([place]), Hypertension, and Hypothyroidism.   He has a past surgical history that includes Prostatectomy (2012); ORIF facial fracture (1988); Knee arthroscopy (Bilateral); Colonoscopy; Lumbar laminectomy/decompression microdiscectomy (N/A, 10/11/2020); Tonsillectomy; and  Transforaminal lumbar interbody fusion w/ mis 1 level (Right, 06/11/2022).   His family history includes Diabetes in his mother; Heart disease in his mother.He reports that he quit smoking about 13 years ago. His smoking use included cigarettes. He quit smokeless tobacco use about 19 years ago.  His smokeless tobacco use included chew. He reports that he does not currently use alcohol. He reports that he does not use drugs.  Current Outpatient Medications on File Prior to Visit  Medication Sig Dispense Refill   Aspirin-Caffeine (BC FAST PAIN RELIEF PO) Take 1 packet by mouth daily as needed (pain).     atorvastatin (LIPITOR) 10 MG tablet Take 1 tablet (10 mg total) by mouth daily. 90 tablet 3   cetirizine (ZYRTEC) 10 MG tablet Take 1 tablet (10 mg total) by mouth daily. 90 tablet 3   clobetasol (TEMOVATE) 0.05 % external solution Apply 1 application topically 2 (two) times daily. (Patient taking differently: Apply 1 application  topically 2 (two) times daily as needed (psoriasis).) 50 mL 5   cyclobenzaprine (FLEXERIL) 10 MG tablet TAKE ONE TABLET BY MOUTH THREE TIMES DAILY AS NEEDED FOR MUSCLE SPASMS 30 tablet 0   insulin glargine, 2 Unit Dial, (TOUJEO MAX SOLOSTAR) 300 UNIT/ML Solostar Pen Inject 40 Units into the skin in the morning and at bedtime. 9 mL 11   insulin lispro (HUMALOG KWIKPEN) 100 UNIT/ML KwikPen INJECT UP TO 20 UNITS THREE TIMES DAILY BEFORE MEALS 15 mL 0   levothyroxine (SYNTHROID) 25 MCG tablet Take 1 tablet (25 mcg total) by mouth daily. 90 tablet 3   lisinopril-hydrochlorothiazide (ZESTORETIC) 20-12.5 MG tablet Take 1 tablet by mouth daily. 90 tablet 3   metFORMIN (GLUCOPHAGE-XR) 500 MG 24 hr tablet Take 1,000 mg by mouth daily with  breakfast.     Multiple Vitamin (MULTIVITAMIN) tablet Take 1 tablet by mouth daily.     pantoprazole (PROTONIX) 40 MG tablet TAKE ONE TABLET ONCE DAILY 90 tablet 0   traZODone (DESYREL) 150 MG tablet Use from 1/3 to 1 tablet nightly as needed for  sleep. 90 tablet 3   triamcinolone cream (KENALOG) 0.1 % APPLY 1 APPLICATION TOPICALLY 2 TIMES DAILY (Patient taking differently: Apply 1 Application topically 2 (two) times daily as needed (rash).) 160 g 2   gabapentin (NEURONTIN) 300 MG capsule Take 1 capsule (300 mg total) by mouth 3 (three) times daily. (Patient not taking: Reported on 10/14/2022) 90 capsule 1   No current facility-administered medications on file prior to visit.    ROS Review of Systems  Constitutional:  Negative for fever.  HENT:  Positive for hearing loss.   Respiratory:  Negative for shortness of breath.   Cardiovascular:  Negative for chest pain.  Musculoskeletal:  Negative for arthralgias.  Skin:  Negative for rash.    Objective:  BP 123/60   Pulse 68   Temp 98.3 F (36.8 C)   Ht '5\' 9"'$  (1.753 m)   Wt 189 lb 6.4 oz (85.9 kg)   SpO2 99%   BMI 27.97 kg/m   BP Readings from Last 3 Encounters:  10/14/22 123/60  06/12/22 (!) 100/52  06/06/22 (!) 157/71    Wt Readings from Last 3 Encounters:  10/14/22 189 lb 6.4 oz (85.9 kg)  06/11/22 182 lb (82.6 kg)  06/06/22 192 lb 6.4 oz (87.3 kg)     Physical Exam Constitutional:      Appearance: He is well-developed.  HENT:     Head: Normocephalic and atraumatic.  Eyes:     Pupils: Pupils are equal, round, and reactive to light.  Neck:     Thyroid: No thyromegaly.     Trachea: No tracheal deviation.  Cardiovascular:     Rate and Rhythm: Normal rate and regular rhythm.     Heart sounds: Normal heart sounds. No murmur heard.    No friction rub. No gallop.  Pulmonary:     Breath sounds: Normal breath sounds. No wheezing or rales.  Abdominal:     General: Bowel sounds are normal. There is no distension.     Palpations: Abdomen is soft. There is no mass.     Tenderness: There is no abdominal tenderness.     Hernia: There is no hernia in the left inguinal area.  Genitourinary:    Penis: Normal.      Testes: Normal.  Musculoskeletal:        General:  Normal range of motion.     Cervical back: Normal range of motion.  Lymphadenopathy:     Cervical: No cervical adenopathy.  Skin:    General: Skin is warm and dry.  Neurological:     Mental Status: He is alert and oriented to person, place, and time.     Diabetic Foot Exam - Simple   No data filed     Lab Results  Component Value Date   HGBA1C 7.8 (H) 10/14/2022   HGBA1C 7.9 (H) 06/06/2022   HGBA1C 8.1 (H) 11/07/2020    Assessment & Plan:   Laythan was seen today for medical management of chronic issues.  Diagnoses and all orders for this visit:  Mixed hyperlipidemia -     Lipid panel  Type 1 diabetes mellitus without complication (HCC) -     Bayer DCA Hb A1c Waived -  Cancel: Urinalysis -     Microalbumin / creatinine urine ratio -     Urinalysis  Essential hypertension -     CBC with Differential/Platelet -     CMP14+EGFR  Hypothyroidism, unspecified type -     TSH + free T4  Vitamin D deficiency -     VITAMIN D 25 Hydroxy (Vit-D Deficiency, Fractures)  Screening for prostate cancer -     PSA, total and free  GAD (generalized anxiety disorder) -     ALPRAZolam (XANAX) 0.5 MG tablet; Take 1.5 tablets (0.75 mg total) by mouth at bedtime as needed. for sleep  Nerve compression  Degenerative disc disease, lumbar  Need for influenza vaccination -     Flu Vaccine QUAD 6+ mos PF IM (Fluarix Quad PF)  Other orders -     Discontinue: Pseudoephedrine-Guaifenesin [phone] MG TB12; Take 1 tablet by mouth 2 (two) times daily. For congestion -     Urinalysis   I have discontinued Virgia Land "[name]"'s oxyCODONE-acetaminophen and Pseudoephedrine-Guaifenesin. I am also having him maintain his multivitamin, cetirizine, triamcinolone cream, traZODone, clobetasol, atorvastatin, Toujeo Max SoloStar, levothyroxine, lisinopril-hydrochlorothiazide, cyclobenzaprine, gabapentin, metFORMIN, Aspirin-Caffeine (BC FAST PAIN RELIEF PO), insulin lispro, pantoprazole, and  ALPRAZolam.  Meds ordered this encounter  Medications   ALPRAZolam (XANAX) 0.5 MG tablet    Sig: Take 1.5 tablets (0.75 mg total) by mouth at bedtime as needed. for sleep    Dispense:  45 tablet    Refill:  5   DISCONTD: Pseudoephedrine-Guaifenesin [phone] MG TB12    Sig: Take 1 tablet by mouth 2 (two) times daily. For congestion    Dispense:  30 tablet    Refill:  5     Follow-up: Return in about 6 months (around 04/14/2023).  Claretta Fraise, M.D.

## 2022-10-15 LAB — CMP14+EGFR
ALT: 29 IU/L (ref 0–44)
AST: 22 IU/L (ref 0–40)
Albumin/Globulin Ratio: 1.7 (ref 1.2–2.2)
Albumin: 4.7 g/dL (ref 3.9–4.9)
Alkaline Phosphatase: 69 IU/L (ref 44–121)
BUN/Creatinine Ratio: 19 (ref 10–24)
BUN: 17 mg/dL (ref 8–27)
Bilirubin Total: 0.8 mg/dL (ref 0.0–1.2)
CO2: 22 mmol/L (ref 20–29)
Calcium: 10.3 mg/dL — ABNORMAL HIGH (ref 8.6–10.2)
Chloride: 99 mmol/L (ref 96–106)
Creatinine, Ser: 0.9 mg/dL (ref 0.76–1.27)
Globulin, Total: 2.8 g/dL (ref 1.5–4.5)
Glucose: 211 mg/dL — ABNORMAL HIGH (ref 70–99)
Potassium: 4.6 mmol/L (ref 3.5–5.2)
Sodium: 136 mmol/L (ref 134–144)
Total Protein: 7.5 g/dL (ref 6.0–8.5)
eGFR: 96 mL/min/{1.73_m2} (ref >59)

## 2022-10-15 LAB — CBC WITH DIFFERENTIAL/PLATELET
Basophils Absolute: 0.1 10*3/uL (ref 0.0–0.2)
Basos: 1 %
EOS (ABSOLUTE): 0.5 10*3/uL — ABNORMAL HIGH (ref 0.0–0.4)
Eos: 7 %
Hematocrit: 42.1 % (ref 37.5–51.0)
Hemoglobin: 14.3 g/dL (ref 13.0–17.7)
Immature Grans (Abs): 0 10*3/uL (ref 0.0–0.1)
Immature Granulocytes: 0 %
Lymphocytes Absolute: 3 10*3/uL (ref 0.7–3.1)
Lymphs: 37 %
MCH: 30.7 pg (ref 26.6–33.0)
MCHC: 34 g/dL (ref 31.5–35.7)
MCV: 90 fL (ref 79–97)
Monocytes Absolute: 0.6 10*3/uL (ref 0.1–0.9)
Monocytes: 7 %
Neutrophils Absolute: 3.8 10*3/uL (ref 1.4–7.0)
Neutrophils: 48 %
Platelets: 292 10*3/uL (ref 150–450)
RBC: 4.66 x10E6/uL (ref 4.14–5.80)
RDW: 11.9 % (ref 11.6–15.4)
WBC: 8 10*3/uL (ref 3.4–10.8)

## 2022-10-15 LAB — LIPID PANEL
Chol/HDL Ratio: 2.3 ratio (ref 0.0–5.0)
Cholesterol, Total: 144 mg/dL (ref 100–199)
HDL: 63 mg/dL (ref >39)
LDL Chol Calc (NIH): 67 mg/dL (ref 0–99)
Triglycerides: 69 mg/dL (ref 0–149)
VLDL Cholesterol Cal: 14 mg/dL (ref 5–40)

## 2022-10-15 LAB — TSH+FREE T4
Free T4: 1.37 ng/dL (ref 0.82–1.77)
TSH: 2.67 u[IU]/mL (ref 0.450–4.500)

## 2022-10-15 LAB — PSA, TOTAL AND FREE
PSA, Free: <0.02 ng/mL
Prostate Specific Ag, Serum: <0.1 ng/mL (ref 0.0–4.0)

## 2022-10-15 LAB — VITAMIN D 25 HYDROXY (VIT D DEFICIENCY, FRACTURES): Vit D, 25-Hydroxy: 41 ng/mL (ref 30.0–100.0)

## 2022-10-15 NOTE — Progress Notes (Signed)
Hello Ioan,  Your lab result is normal and/or stable.Some minor variations that are not significant are commonly marked abnormal, but do not represent any medical problem for you.  Best regards, Aureliano Oshields, M.D.

## 2022-10-18 ENCOUNTER — Other Ambulatory Visit: Payer: Self-pay | Admitting: Family Medicine

## 2022-11-19 ENCOUNTER — Other Ambulatory Visit (HOSPITAL_COMMUNITY): Payer: Self-pay

## 2022-11-19 NOTE — Telephone Encounter (Signed)
Pharmacy Patient Advocate Encounter   Received notification that prior authorization for [facility] Max SoloStar 300unit/ml is required/requested.  Per Test Claim: Non-formulary drug   PA submitted on 11/19/22 to (ins) [employer] via CoverMyMeds Key [id number] Status is pending

## 2022-11-20 ENCOUNTER — Other Ambulatory Visit (HOSPITAL_COMMUNITY): Payer: Self-pay

## 2022-11-20 NOTE — Telephone Encounter (Signed)
Pharmacy Patient Advocate Encounter  Received notification from [place] that the request for prior authorization for Toujeo has been denied due to .    Please be advised we currently do not have a Pharmacist to review denials, therefore you will need to process appeals accordingly as needed. Thanks for your support at this time.   You may call [phone] to appeal.

## 2022-11-30 ENCOUNTER — Other Ambulatory Visit: Payer: Self-pay | Admitting: Family Medicine

## 2022-11-30 MED ORDER — TRESIBA FLEXTOUCH 200 UNIT/ML ~~LOC~~ SOPN: 80.0000 [IU] | PEN_INJECTOR | Freq: Every day | SUBCUTANEOUS | 3 refills | Status: AC

## 2022-11-30 NOTE — Telephone Encounter (Signed)
Please let the patient know that I sent their prescription to their pharmacy. Thanks, WS 

## 2022-12-01 NOTE — Telephone Encounter (Signed)
Pt aware.

## 2023-01-10 ENCOUNTER — Other Ambulatory Visit: Payer: Self-pay | Admitting: Family Medicine

## 2023-01-13 ENCOUNTER — Telehealth: Payer: Self-pay

## 2023-01-13 NOTE — Telephone Encounter (Signed)
(  Key: [id number])  Pantoprazole   Your information has been submitted to [employer]. To check for an updated outcome later, reopen this PA request from your dashboard.  If [employer] has not responded to your request within 24 hours, contact [employer] at [phone]. If you think there may be a problem with your PA request, use our live chat feature at the bottom right.

## 2023-01-27 ENCOUNTER — Telehealth: Payer: Self-pay

## 2023-01-27 NOTE — Telephone Encounter (Signed)
Original PA denied due to answering no to the following question: Is the requested drug being prescribed for any of the following: A) Endoscopically verified peptic ulcer disease, B) Frequent and severe symptoms of chronic gastroesophageal reflux disease (GERD), C) Atypical symptoms or complications of GERD?  Submitted new PA and it has been approved.   Key: [id number]

## 2023-01-27 NOTE — Telephone Encounter (Signed)
PATIENT AWARE

## 2023-01-27 NOTE — Telephone Encounter (Signed)
Pharmacy Patient Advocate Encounter  Prior Authorization for Pantoprazole has been approved by [employer] (ins).    PA # [id number] Effective dates: 01/27/2023 through 01/27/2024 KEY: [id number]

## 2023-01-27 NOTE — Telephone Encounter (Signed)
Let pt. Know, pantoprazole has been approved

## 2023-03-18 ENCOUNTER — Other Ambulatory Visit: Payer: Self-pay | Admitting: Family Medicine

## 2023-03-18 DIAGNOSIS — E109 Type 1 diabetes mellitus without complications: Secondary | ICD-10-CM

## 2023-03-18 DIAGNOSIS — E039 Hypothyroidism, unspecified: Secondary | ICD-10-CM

## 2023-04-14 ENCOUNTER — Encounter: Payer: Self-pay | Admitting: Family Medicine

## 2023-04-14 ENCOUNTER — Ambulatory Visit (INDEPENDENT_AMBULATORY_CARE_PROVIDER_SITE_OTHER): Payer: 59 | Admitting: Family Medicine

## 2023-04-14 VITALS — BP 137/59 | HR 65 | Temp 97.3°F | Ht 69.0 in | Wt 187.0 lb

## 2023-04-14 DIAGNOSIS — E039 Hypothyroidism, unspecified: Secondary | ICD-10-CM | POA: Diagnosis not present

## 2023-04-14 DIAGNOSIS — I10 Essential (primary) hypertension: Secondary | ICD-10-CM | POA: Diagnosis not present

## 2023-04-14 DIAGNOSIS — F411 Generalized anxiety disorder: Secondary | ICD-10-CM | POA: Diagnosis not present

## 2023-04-14 DIAGNOSIS — E109 Type 1 diabetes mellitus without complications: Secondary | ICD-10-CM

## 2023-04-14 DIAGNOSIS — E782 Mixed hyperlipidemia: Secondary | ICD-10-CM | POA: Diagnosis not present

## 2023-04-14 LAB — CBC WITH DIFFERENTIAL/PLATELET
Basophils Absolute: 0.1 10*3/uL (ref 0.0–0.2)
Basos: 1 %
EOS (ABSOLUTE): 0.4 10*3/uL (ref 0.0–0.4)
Eos: 6 %
Hematocrit: 40.3 % (ref 37.5–51.0)
Hemoglobin: 13.6 g/dL (ref 13.0–17.7)
Immature Grans (Abs): 0 10*3/uL (ref 0.0–0.1)
Immature Granulocytes: 0 %
Lymphocytes Absolute: 2.9 10*3/uL (ref 0.7–3.1)
Lymphs: 45 %
MCH: 31.6 pg (ref 26.6–33.0)
MCHC: 33.7 g/dL (ref 31.5–35.7)
MCV: 94 fL (ref 79–97)
Monocytes Absolute: 0.6 10*3/uL (ref 0.1–0.9)
Monocytes: 8 %
Neutrophils Absolute: 2.7 10*3/uL (ref 1.4–7.0)
Neutrophils: 40 %
Platelets: 256 10*3/uL (ref 150–450)
RBC: 4.3 x10E6/uL (ref 4.14–5.80)
RDW: 12.5 % (ref 11.6–15.4)
WBC: 6.7 10*3/uL (ref 3.4–10.8)

## 2023-04-14 LAB — CMP14+EGFR
ALT: 27 IU/L (ref 0–44)
AST: 21 IU/L (ref 0–40)
Albumin: 4.2 g/dL (ref 3.9–4.9)
Alkaline Phosphatase: 74 IU/L (ref 44–121)
BUN/Creatinine Ratio: 17 (ref 10–24)
BUN: 13 mg/dL (ref 8–27)
Bilirubin Total: 0.7 mg/dL (ref 0.0–1.2)
CO2: 24 mmol/L (ref 20–29)
Calcium: 9.9 mg/dL (ref 8.6–10.2)
Chloride: 104 mmol/L (ref 96–106)
Creatinine, Ser: 0.77 mg/dL (ref 0.76–1.27)
Globulin, Total: 2.5 g/dL (ref 1.5–4.5)
Glucose: 81 mg/dL (ref 70–99)
Potassium: 4.7 mmol/L (ref 3.5–5.2)
Sodium: 141 mmol/L (ref 134–144)
Total Protein: 6.7 g/dL (ref 6.0–8.5)
eGFR: 100 mL/min/{1.73_m2} (ref >59)

## 2023-04-14 LAB — LIPID PANEL
Chol/HDL Ratio: 1.9 ratio (ref 0.0–5.0)
Cholesterol, Total: 126 mg/dL (ref 100–199)
HDL: 65 mg/dL (ref >39)
LDL Chol Calc (NIH): 51 mg/dL (ref 0–99)
Triglycerides: 42 mg/dL (ref 0–149)
VLDL Cholesterol Cal: 10 mg/dL (ref 5–40)

## 2023-04-14 LAB — BAYER DCA HB A1C WAIVED: HB A1C (BAYER DCA - WAIVED): 7.8 % — ABNORMAL HIGH (ref 4.8–5.6)

## 2023-04-14 MED ORDER — ALPRAZOLAM 0.5 MG PO TABS: 0.7500 mg | ORAL_TABLET | Freq: Every evening | ORAL | 5 refills | Status: DC | PRN

## 2023-04-14 MED ORDER — LISINOPRIL-HYDROCHLOROTHIAZIDE 20-12.5 MG PO TABS: 1.0000 | ORAL_TABLET | Freq: Every day | ORAL | 3 refills | Status: DC

## 2023-04-14 MED ORDER — PANTOPRAZOLE SODIUM 40 MG PO TBEC: 40.0000 mg | DELAYED_RELEASE_TABLET | Freq: Every day | ORAL | 3 refills | Status: DC

## 2023-04-14 MED ORDER — LEVOTHYROXINE SODIUM 25 MCG PO TABS: 25.0000 ug | ORAL_TABLET | Freq: Every day | ORAL | 3 refills | Status: DC

## 2023-04-14 MED ORDER — ATORVASTATIN CALCIUM 10 MG PO TABS: 10.0000 mg | ORAL_TABLET | Freq: Every day | ORAL | 3 refills | Status: DC

## 2023-04-14 NOTE — Progress Notes (Signed)
Subjective:  Patient ID: Charles Foley, male    DOB: [birth year]  Age: 62 y.o. MRN: [id number]  CC: Medical Management of Chronic Issues   HPI [name] presents for  presents for  follow-up of hypertension. Patient has no history of headache chest pain or shortness of breath or recent cough. Patient also denies symptoms of TIA such as focal numbness or weakness. Patient denies side effects from medication. States taking it regularly.   follow-up on  thyroid. The patient has a history of hypothyroidism for many years. It has been stable recently. Pt. denies any change in  voice, loss of hair, heat or cold intolerance. Energy level has been adequate to good. Patient denies constipation and diarrhea. No myxedema. Medication is as noted below. Verified that pt is taking it daily on an empty stomach. Well tolerated.  presents forFollow-up of diabetes. Patient checks blood sugar at home.   150s fasting and 80-120 postprandial Patient denies symptoms such as polyuria, polydipsia, excessive hunger, nausea No significant hypoglycemic spells noted.Gets a little low if he doesn't eat much.  Medications reviewed. Pt reports taking them regularly without complication/adverse reaction being reported today.  Lab Results  Component Value Date   HGBA1C 7.8 (H) 10/14/2022   HGBA1C 7.9 (H) 06/06/2022   HGBA1C 8.1 (H) 11/07/2020         04/14/2023    8:03 AM 10/14/2022    8:24 AM 04/24/2022    9:36 AM  Depression screen PHQ 2/9  Decreased Interest 0 0 0  Down, Depressed, Hopeless 0 0 0  PHQ - 2 Score 0 0 0    History [name] has a past medical history of Acute pain of right knee (06/17/2017), Allergic rhinitis, Anxiety, Arthritis, Cancer (HCC) (2012), Chest pain (11/02/2014), Diabetes mellitus without complication (HCC), Hypertension, and Hypothyroidism.   He has a past surgical history that includes Prostatectomy (2012); ORIF facial fracture (1988); Knee arthroscopy (Bilateral); Colonoscopy; Lumbar  laminectomy/decompression microdiscectomy (N/A, 10/11/2020); Tonsillectomy; and Transforaminal lumbar interbody fusion w/ mis 1 level (Right, 06/11/2022).   His family history includes Diabetes in his mother; Heart disease in his mother.He reports that he quit smoking about 14 years ago. His smoking use included cigarettes. He quit smokeless tobacco use about 20 years ago.  His smokeless tobacco use included chew. He reports that he does not currently use alcohol. He reports that he does not use drugs.    ROS Review of Systems  Constitutional:  Negative for fever.  Respiratory:  Negative for shortness of breath.   Cardiovascular:  Negative for chest pain.  Musculoskeletal:  Negative for arthralgias.  Skin:  Negative for rash.    Objective:  BP (!) 137/59   Pulse 65   Temp (!) 97.3 F (36.3 C)   Ht 5\' 9"  (1.753 m)   Wt 187 lb (84.8 kg)   SpO2 99%   BMI 27.62 kg/m   BP Readings from Last 3 Encounters:  04/14/23 (!) 137/59  10/14/22 123/60  06/12/22 (!) 100/52    Wt Readings from Last 3 Encounters:  04/14/23 187 lb (84.8 kg)  10/14/22 189 lb 6.4 oz (85.9 kg)  06/11/22 182 lb (82.6 kg)     Physical Exam Vitals reviewed.  Constitutional:      Appearance: He is well-developed.  HENT:     Head: Normocephalic and atraumatic.     Right Ear: External ear normal.     Left Ear: External ear normal.     Mouth/Throat:     Pharynx:  No oropharyngeal exudate or posterior oropharyngeal erythema.  Eyes:     Pupils: Pupils are equal, round, and reactive to light.  Cardiovascular:     Rate and Rhythm: Normal rate and regular rhythm.     Heart sounds: No murmur heard. Pulmonary:     Effort: No respiratory distress.     Breath sounds: Normal breath sounds.  Musculoskeletal:     Cervical back: Normal range of motion and neck supple.  Neurological:     Mental Status: He is alert and oriented to person, place, and time.       Assessment & Plan:   [name]" was seen today  for medical management of chronic issues.  Diagnoses and all orders for this visit:  Essential hypertension -     CBC with Differential/Platelet -     CMP14+EGFR  GAD (generalized anxiety disorder) -     ALPRAZolam (XANAX) 0.5 MG tablet; Take 1.5 tablets (0.75 mg total) by mouth at bedtime as needed. for sleep  Type 1 diabetes mellitus without complication (HCC) -     atorvastatin (LIPITOR) 10 MG tablet; Take 1 tablet (10 mg total) by mouth daily. -     Bayer DCA Hb A1c Waived  Hypothyroidism, unspecified type -     levothyroxine (SYNTHROID) 25 MCG tablet; Take 1 tablet (25 mcg total) by mouth daily. -     TSH + free T4; Future  Mixed hyperlipidemia -     Lipid panel  Other orders -     lisinopril-hydrochlorothiazide (ZESTORETIC) 20-12.5 MG tablet; Take 1 tablet by mouth daily. -     pantoprazole (PROTONIX) 40 MG tablet; Take 1 tablet (40 mg total) by mouth daily.       I have changed [name] "[name]"'s atorvastatin, levothyroxine, lisinopril-hydrochlorothiazide, and pantoprazole. I am also having him maintain his multivitamin, cetirizine, triamcinolone cream, clobetasol, cyclobenzaprine, gabapentin, metFORMIN, Aspirin-Caffeine (BC FAST PAIN RELIEF PO), insulin lispro, traZODone, Tresiba FlexTouch, and ALPRAZolam.  Allergies as of 04/14/2023       Reactions   Latex Rash        Medication List        Accurate as of April 14, 2023 10:14 AM. If you have any questions, ask your nurse or doctor.          ALPRAZolam 0.5 MG tablet Commonly known as: XANAX Take 1.5 tablets (0.75 mg total) by mouth at bedtime as needed. for sleep   atorvastatin 10 MG tablet Commonly known as: LIPITOR Take 1 tablet (10 mg total) by mouth daily.   BC FAST PAIN RELIEF PO Take 1 packet by mouth daily as needed (pain).   cetirizine 10 MG tablet Commonly known as: ZYRTEC Take 1 tablet (10 mg total) by mouth daily.   clobetasol 0.05 % external solution Commonly known as:  TEMOVATE Apply 1 application topically 2 (two) times daily. What changed:  when to take this reasons to take this   cyclobenzaprine 10 MG tablet Commonly known as: FLEXERIL TAKE ONE TABLET BY MOUTH THREE TIMES DAILY AS NEEDED FOR MUSCLE SPASMS   gabapentin 300 MG capsule Commonly known as: NEURONTIN Take 1 capsule (300 mg total) by mouth 3 (three) times daily.   insulin lispro 100 UNIT/ML KwikPen Commonly known as: HumaLOG KwikPen INJECT UP TO 20 UNITS THREE TIMES DAILY BEFORE MEALS   levothyroxine 25 MCG tablet Commonly known as: SYNTHROID Take 1 tablet (25 mcg total) by mouth daily.   lisinopril-hydrochlorothiazide 20-12.5 MG tablet Commonly known as: ZESTORETIC Take 1  tablet by mouth daily.   metFORMIN 500 MG 24 hr tablet Commonly known as: GLUCOPHAGE-XR Take 1,000 mg by mouth daily with breakfast.   multivitamin tablet Take 1 tablet by mouth daily.   pantoprazole 40 MG tablet Commonly known as: PROTONIX Take 1 tablet (40 mg total) by mouth daily.   traZODone 150 MG tablet Commonly known as: DESYREL TAKE 1/3 UP TO 1 TABLET AT BEDTIME AS NEEDED FOR SLEEP   Tresiba FlexTouch 200 UNIT/ML FlexTouch Pen Generic drug: insulin degludec Inject 80 Units into the skin daily.   triamcinolone cream 0.1 % Commonly known as: KENALOG APPLY 1 APPLICATION TOPICALLY 2 TIMES DAILY What changed:  how much to take when to take this reasons to take this         Follow-up: Return in about 3 months (around 07/15/2023).  Mechele Claude, M.D.

## 2023-04-15 NOTE — Progress Notes (Signed)
Hello Davionne,  Your lab result is normal and/or stable.Some minor variations that are not significant are commonly marked abnormal, but do not represent any medical problem for you.  Best regards, Warren Stacks, M.D.

## 2023-06-15 IMAGING — DX DG LUMBAR SPINE COMPLETE 4+V
5 series · 5 of 5 positions shown · non-contrast
Comparison: 10/11/2020 and 06/11/2020

CLINICAL DATA: Right lower back pain radiating down the right leg
after injury on [REDACTED].

EXAM:
LUMBAR SPINE - COMPLETE 4+ VIEW

[l-spine ap]
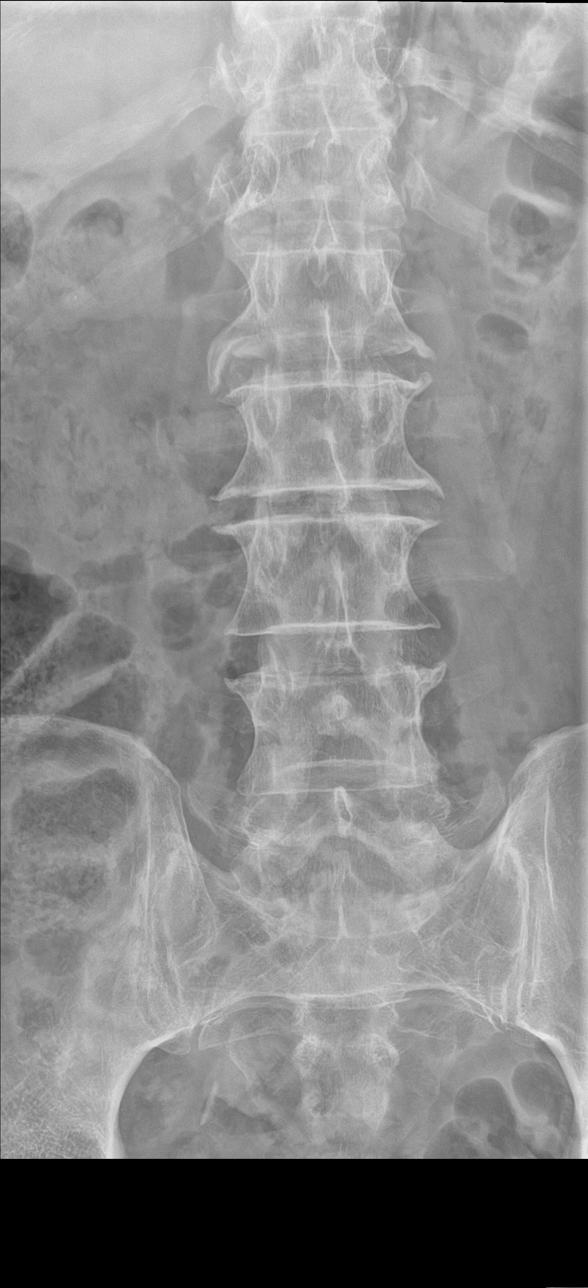

[l-spine obl (1 of 2)]
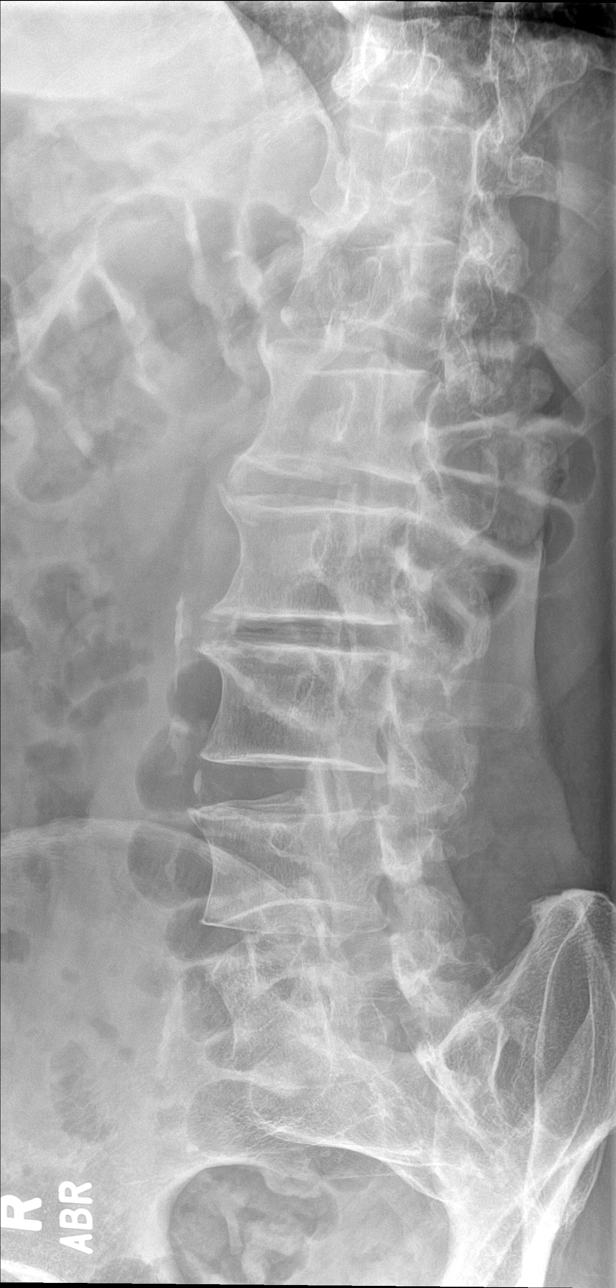

[l-spine obl (2 of 2)]
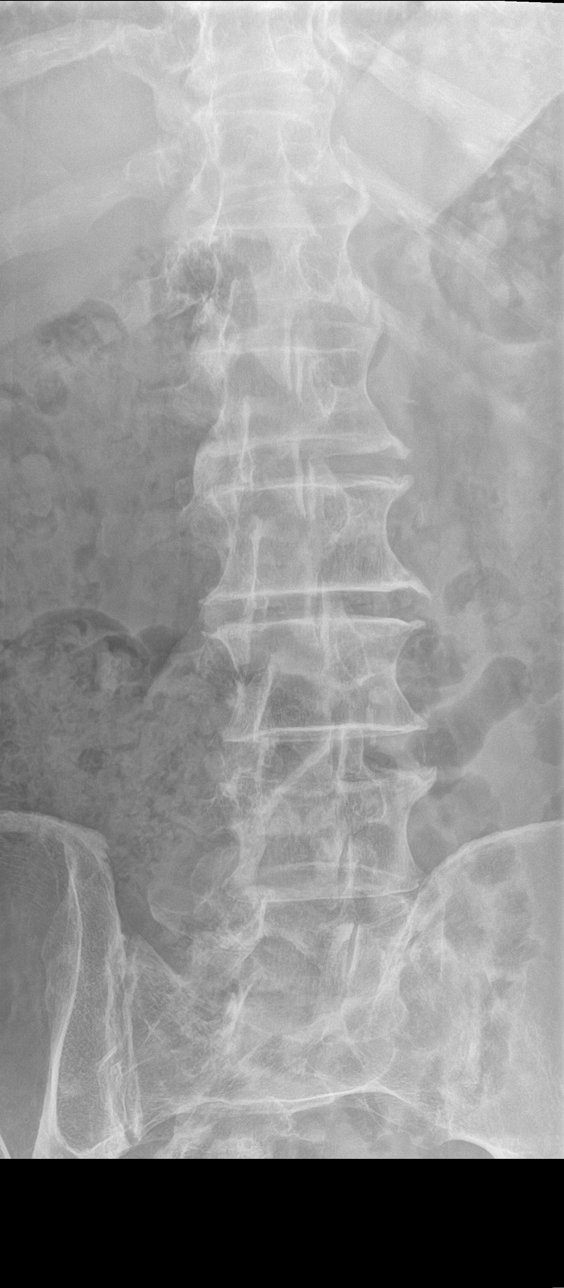

[l-spine lat]
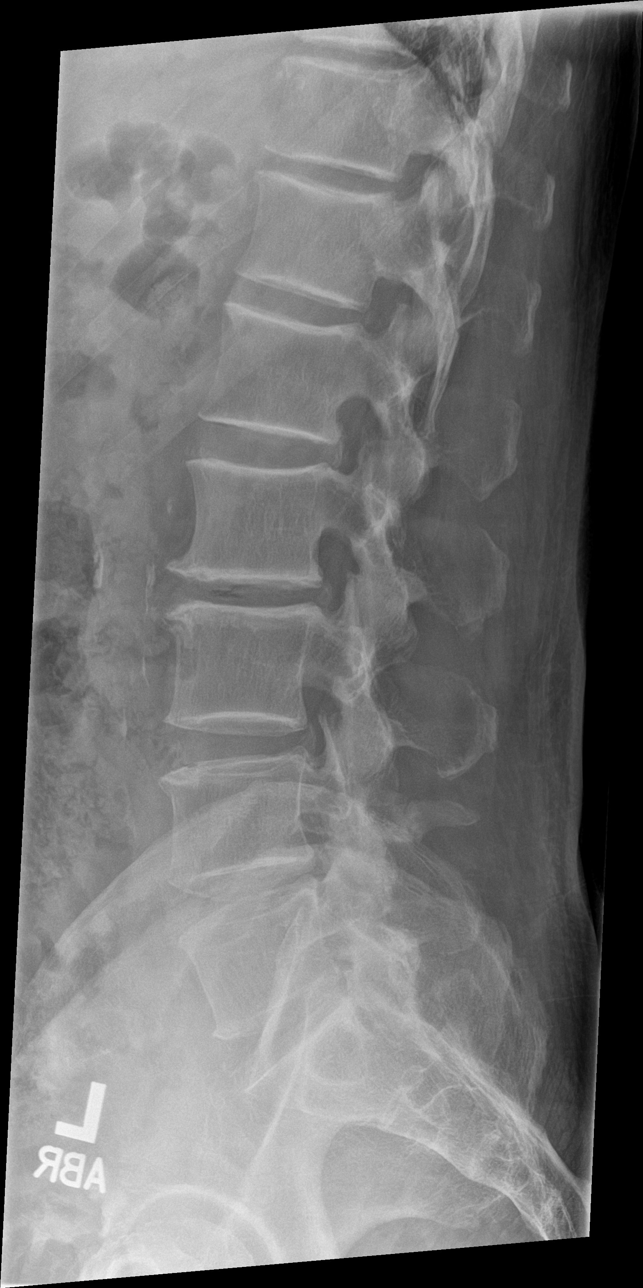

[l-spine spot]
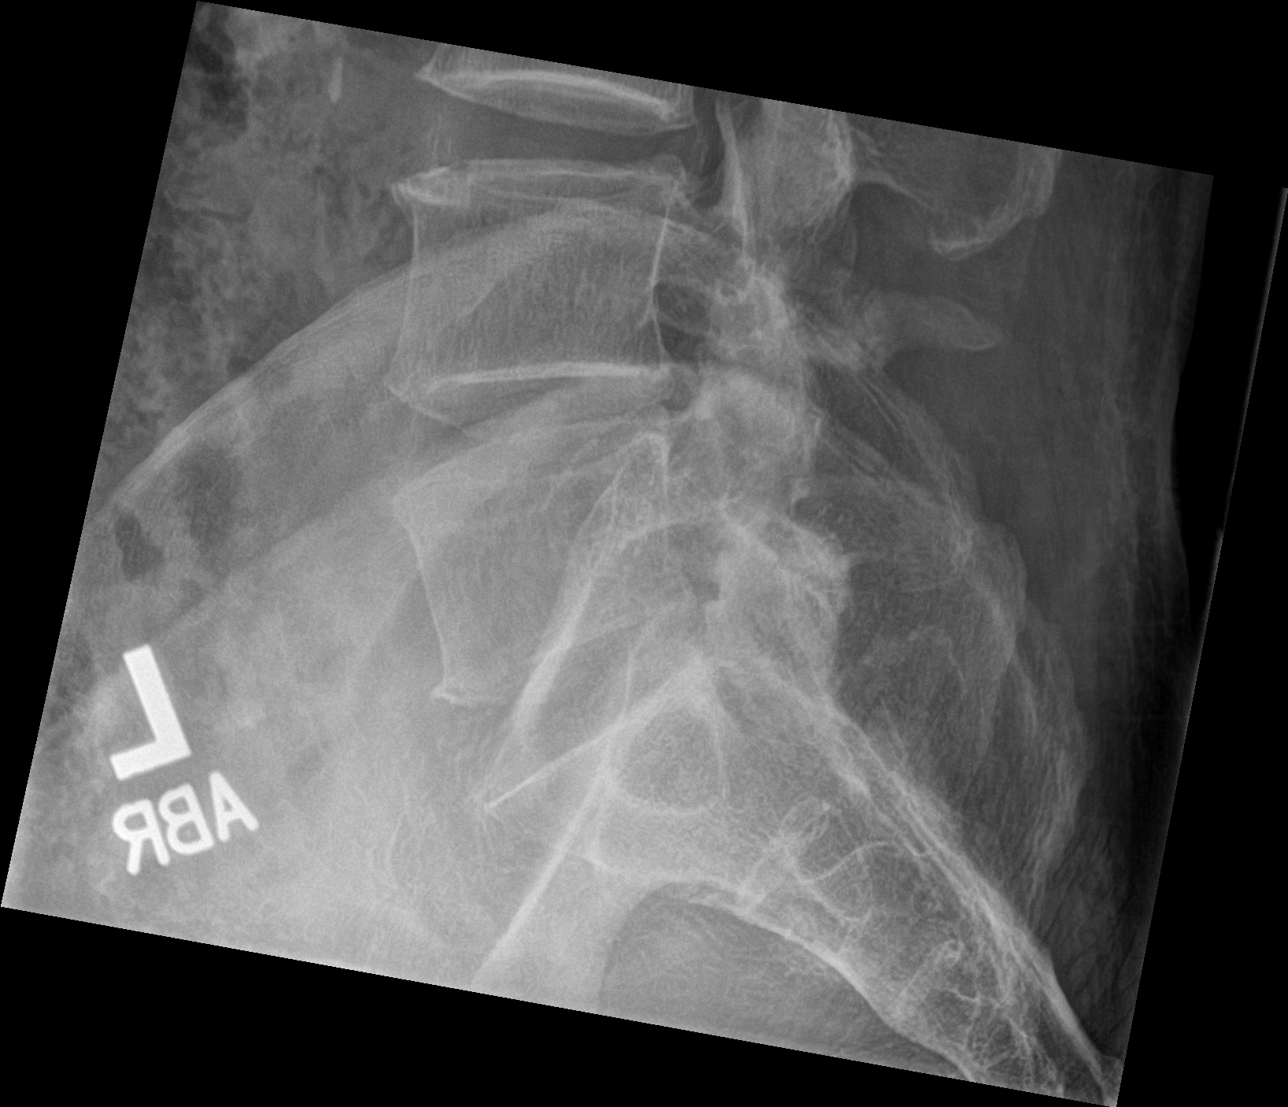

[5 of 5 positions shown; findings below may reference images not displayed]

FINDINGS: Five lumbar type vertebral bodies. Normal alignment. No vertebral
compression deformities. Postoperative change at L4. Degenerative
changes are present with narrowed interspaces and endplate
osteophyte formation. Degenerative disc disease at L2-3. No
significant progression since previous study. No focal bone lesion
or bone destruction. Visualized sacrum appears intact. Vascular
calcification in the aorta.
IMPRESSION: Mild degenerative changes in the lumbar spine. Normal alignment. No
acute displaced fractures are identified.

## 2023-07-16 ENCOUNTER — Ambulatory Visit: Payer: 59 | Admitting: Family Medicine

## 2023-08-10 ENCOUNTER — Other Ambulatory Visit: Payer: Self-pay | Admitting: Family Medicine

## 2023-08-12 ENCOUNTER — Other Ambulatory Visit: Payer: Self-pay | Admitting: Family Medicine

## 2023-08-13 ENCOUNTER — Ambulatory Visit (INDEPENDENT_AMBULATORY_CARE_PROVIDER_SITE_OTHER): Payer: 59 | Admitting: Family Medicine

## 2023-08-13 ENCOUNTER — Encounter: Payer: Self-pay | Admitting: Family Medicine

## 2023-08-13 VITALS — BP 131/64 | HR 54 | Temp 97.8°F | Ht 69.0 in | Wt 182.8 lb

## 2023-08-13 DIAGNOSIS — Z23 Encounter for immunization: Secondary | ICD-10-CM | POA: Diagnosis not present

## 2023-08-13 DIAGNOSIS — I10 Essential (primary) hypertension: Secondary | ICD-10-CM | POA: Diagnosis not present

## 2023-08-13 DIAGNOSIS — E039 Hypothyroidism, unspecified: Secondary | ICD-10-CM

## 2023-08-13 DIAGNOSIS — E782 Mixed hyperlipidemia: Secondary | ICD-10-CM | POA: Diagnosis not present

## 2023-08-13 DIAGNOSIS — E109 Type 1 diabetes mellitus without complications: Secondary | ICD-10-CM | POA: Diagnosis not present

## 2023-08-13 LAB — BAYER DCA HB A1C WAIVED: HB A1C (BAYER DCA - WAIVED): 8 % — ABNORMAL HIGH (ref 4.8–5.6)

## 2023-08-13 MED ORDER — INSULIN ASPART 100 UNIT/ML IJ SOLN: 20.0000 [IU] | Freq: Three times a day (TID) | INTRAMUSCULAR | 99 refills | Status: DC

## 2023-08-13 NOTE — Progress Notes (Signed)
Subjective:  Patient ID: Charles Foley, male    DOB: March 11, 1961  Age: 62 y.o. MRN: [id number]  CC: Medical Management of Chronic Issues   HPI Charles Foley presents for presents forFollow-up of diabetes. Patient checks blood sugar at home.   75-120 fasting and 100-110 preprandial. Can drop to 52 if he waits too long to eat.  Patient denies symptoms such as polyuria, polydipsia, excessive hunger, nausea No significant hypoglycemic spells noted. Medications reviewed. Pt reports taking them regularly without complication/adverse reaction being reported today.   Lab Results  Component Value Date   HGBA1C 7.8 (H) 04/14/2023   HGBA1C 7.8 (H) 10/14/2022   HGBA1C 7.9 (H) 06/06/2022     presents for  follow-up of hypertension. Patient has no history of headache chest pain or shortness of breath or recent cough. Patient also denies symptoms of TIA such as focal numbness or weakness. Patient denies side effects from medication. States taking it regularly.   follow-up on  thyroid. The patient has a history of hypothyroidism for many years. It has been stable recently. Pt. denies any change in  voice, loss of hair, heat or cold intolerance. Energy level has been adequate to good. Patient denies constipation and diarrhea. No myxedema. Medication is as noted below. Verified that pt is taking it daily on an empty stomach. Well tolerated.   in for follow-up of elevated cholesterol. Doing well without complaints on current medication. Denies side effects of statin including myalgia and arthralgia and nausea. Currently no chest pain, shortness of breath or other cardiovascular related symptoms noted.       08/13/2023   10:38 AM 04/14/2023    8:03 AM 10/14/2022    8:24 AM  Depression screen PHQ 2/9  Decreased Interest 0 0 0  Down, Depressed, Hopeless 0 0 0  PHQ - 2 Score 0 0 0    History Charles Foley has a past medical history of Acute pain of right knee (06/17/2017), Allergic rhinitis, Anxiety, Arthritis,  Cancer (HCC) (2012), Chest pain (11/02/2014), Diabetes mellitus without complication (HCC), Hypertension, and Hypothyroidism.   He has a past surgical history that includes Prostatectomy (2012); ORIF facial fracture (1988); Knee arthroscopy (Bilateral); Colonoscopy; Lumbar laminectomy/decompression microdiscectomy (N/A, 10/11/2020); Tonsillectomy; and Transforaminal lumbar interbody fusion w/ mis 1 level (Right, 06/11/2022).   His family history includes Diabetes in his mother; Heart disease in his mother.He reports that he quit smoking about 14 years ago. His smoking use included cigarettes. He quit smokeless tobacco use about 20 years ago.  His smokeless tobacco use included chew. He reports that he does not currently use alcohol. He reports that he does not use drugs.    ROS Review of Systems  Constitutional:  Negative for fever.  Respiratory:  Negative for shortness of breath.   Cardiovascular:  Negative for chest pain.  Musculoskeletal:  Negative for arthralgias.  Skin:  Negative for rash.    Objective:  BP 131/64   Pulse (!) 54   Temp 97.8 F (36.6 C)   Ht 5\' 9"  (1.753 m)   Wt 182 lb 12.8 oz (82.9 kg)   SpO2 99%   BMI 26.99 kg/m   BP Readings from Last 3 Encounters:  08/13/23 131/64  04/14/23 (!) 137/59  10/14/22 123/60    Wt Readings from Last 3 Encounters:  08/13/23 182 lb 12.8 oz (82.9 kg)  04/14/23 187 lb (84.8 kg)  10/14/22 189 lb 6.4 oz (85.9 kg)     Physical Exam Vitals reviewed.  Constitutional:  Appearance: He is well-developed.  HENT:     Head: Normocephalic and atraumatic.     Right Ear: External ear normal.     Left Ear: External ear normal.     Mouth/Throat:     Pharynx: No oropharyngeal exudate or posterior oropharyngeal erythema.  Eyes:     Pupils: Pupils are equal, round, and reactive to light.  Cardiovascular:     Rate and Rhythm: Normal rate and regular rhythm.     Heart sounds: No murmur heard. Pulmonary:     Effort: No respiratory  distress.     Breath sounds: Normal breath sounds.  Musculoskeletal:     Cervical back: Normal range of motion and neck supple.  Neurological:     Mental Status: He is alert and oriented to person, place, and time.       Assessment & Plan:   Charles Foley" was seen today for medical management of chronic issues.  Diagnoses and all orders for this visit:  Mixed hyperlipidemia -     CBC with Differential/Platelet -     CMP14+EGFR -     Lipid panel  Need for influenza vaccination -     Flu vaccine trivalent PF, 6mos and older(Flulaval,Afluria,Fluarix,Fluzone) -     CBC with Differential/Platelet -     CMP14+EGFR  Type 1 diabetes mellitus without complication (HCC) -     CBC with Differential/Platelet -     CMP14+EGFR -     Bayer DCA Hb A1c Waived  Hypothyroidism, unspecified type -     CBC with Differential/Platelet -     CMP14+EGFR -     TSH + free T4  Essential hypertension -     CBC with Differential/Platelet -     CMP14+EGFR  Other orders -     insulin aspart (NOVOLOG) 100 UNIT/ML injection; Inject 20 Units into the skin 3 (three) times daily with meals.       I am having Charles Berkshire "[name]" start on insulin aspart. I am also having him maintain his multivitamin, cetirizine, triamcinolone cream, clobetasol, cyclobenzaprine, gabapentin, metFORMIN, Aspirin-Caffeine (BC FAST PAIN RELIEF PO), insulin lispro, traZODone, Tresiba FlexTouch, ALPRAZolam, atorvastatin, levothyroxine, lisinopril-hydrochlorothiazide, and pantoprazole.  Allergies as of 08/13/2023       Reactions   Latex Rash        Medication List        Accurate as of August 13, 2023 11:59 AM. If you have any questions, ask your nurse or doctor.          ALPRAZolam 0.5 MG tablet Commonly known as: XANAX Take 1.5 tablets (0.75 mg total) by mouth at bedtime as needed. for sleep   atorvastatin 10 MG tablet Commonly known as: LIPITOR Take 1 tablet (10 mg total) by mouth daily.   BC FAST  PAIN RELIEF PO Take 1 packet by mouth daily as needed (pain).   cetirizine 10 MG tablet Commonly known as: ZYRTEC Take 1 tablet (10 mg total) by mouth daily.   clobetasol 0.05 % external solution Commonly known as: TEMOVATE Apply 1 application topically 2 (two) times daily. What changed:  when to take this reasons to take this   cyclobenzaprine 10 MG tablet Commonly known as: FLEXERIL TAKE ONE TABLET BY MOUTH THREE TIMES DAILY AS NEEDED FOR MUSCLE SPASMS   gabapentin 300 MG capsule Commonly known as: NEURONTIN Take 1 capsule (300 mg total) by mouth 3 (three) times daily.   insulin aspart 100 UNIT/ML injection Commonly known as: novoLOG Inject 20 Units into  the skin 3 (three) times daily with meals. Started by: Kaushal Vannice   insulin lispro 100 UNIT/ML KwikPen Commonly known as: HumaLOG KwikPen INJECT UP TO 20 UNITS THREE TIMES DAILY BEFORE MEALS   levothyroxine 25 MCG tablet Commonly known as: SYNTHROID Take 1 tablet (25 mcg total) by mouth daily.   lisinopril-hydrochlorothiazide 20-12.5 MG tablet Commonly known as: ZESTORETIC Take 1 tablet by mouth daily.   metFORMIN 500 MG 24 hr tablet Commonly known as: GLUCOPHAGE-XR Take 1,000 mg by mouth daily with breakfast.   multivitamin tablet Take 1 tablet by mouth daily.   pantoprazole 40 MG tablet Commonly known as: PROTONIX Take 1 tablet (40 mg total) by mouth daily.   traZODone 150 MG tablet Commonly known as: DESYREL TAKE 1/3 UP TO 1 TABLET AT BEDTIME AS NEEDED FOR SLEEP   Tresiba FlexTouch 200 UNIT/ML FlexTouch Pen Generic drug: insulin degludec Inject 80 Units into the skin daily.   triamcinolone cream 0.1 % Commonly known as: KENALOG APPLY 1 APPLICATION TOPICALLY 2 TIMES DAILY What changed:  how much to take when to take this reasons to take this         Follow-up: No follow-ups on file.  Mechele Claude, M.D.

## 2023-08-14 LAB — CMP14+EGFR
ALT: 21 [IU]/L (ref 0–44)
AST: 24 [IU]/L (ref 0–40)
Albumin: 4.5 g/dL (ref 3.9–4.9)
Alkaline Phosphatase: 81 [IU]/L (ref 44–121)
BUN/Creatinine Ratio: 19 (ref 10–24)
BUN: 16 mg/dL (ref 8–27)
Bilirubin Total: 0.8 mg/dL (ref 0.0–1.2)
CO2: 25 mmol/L (ref 20–29)
Calcium: 10 mg/dL (ref 8.6–10.2)
Chloride: 102 mmol/L (ref 96–106)
Creatinine, Ser: 0.86 mg/dL (ref 0.76–1.27)
Globulin, Total: 3.1 g/dL (ref 1.5–4.5)
Glucose: 98 mg/dL (ref 70–99)
Potassium: 5.1 mmol/L (ref 3.5–5.2)
Sodium: 139 mmol/L (ref 134–144)
Total Protein: 7.6 g/dL (ref 6.0–8.5)
eGFR: 97 mL/min/{1.73_m2} (ref >59)

## 2023-08-14 LAB — CBC WITH DIFFERENTIAL/PLATELET
Basophils Absolute: 0.1 10*3/uL (ref 0.0–0.2)
Basos: 1 %
EOS (ABSOLUTE): 0.2 10*3/uL (ref 0.0–0.4)
Eos: 3 %
Hematocrit: 43.7 % (ref 37.5–51.0)
Hemoglobin: 14.7 g/dL (ref 13.0–17.7)
Immature Grans (Abs): 0 10*3/uL (ref 0.0–0.1)
Immature Granulocytes: 0 %
Lymphocytes Absolute: 2.7 10*3/uL (ref 0.7–3.1)
Lymphs: 39 %
MCH: 31.5 pg (ref 26.6–33.0)
MCHC: 33.6 g/dL (ref 31.5–35.7)
MCV: 94 fL (ref 79–97)
Monocytes Absolute: 0.4 10*3/uL (ref 0.1–0.9)
Monocytes: 6 %
Neutrophils Absolute: 3.5 10*3/uL (ref 1.4–7.0)
Neutrophils: 51 %
Platelets: 319 10*3/uL (ref 150–450)
RBC: 4.66 x10E6/uL (ref 4.14–5.80)
RDW: 12.4 % (ref 11.6–15.4)
WBC: 7 10*3/uL (ref 3.4–10.8)

## 2023-08-14 LAB — LIPID PANEL
Chol/HDL Ratio: 2.2 ratio (ref 0.0–5.0)
Cholesterol, Total: 136 mg/dL (ref 100–199)
HDL: 61 mg/dL
LDL Chol Calc (NIH): 64 mg/dL (ref 0–99)
Triglycerides: 47 mg/dL (ref 0–149)
VLDL Cholesterol Cal: 11 mg/dL (ref 5–40)

## 2023-08-14 LAB — TSH+FREE T4
Free T4: 1.42 ng/dL (ref 0.82–1.77)
TSH: 1.82 u[IU]/mL (ref 0.450–4.500)

## 2023-08-14 NOTE — Progress Notes (Signed)
Hello Charles Foley,  Your lab result is normal and/or stable.Some minor variations that are not significant are commonly marked abnormal, but do not represent any medical problem for you.(Other than the A1c that was discussed in the office.)  Best regards, Mechele Claude, M.D.

## 2023-08-15 ENCOUNTER — Other Ambulatory Visit: Payer: Self-pay | Admitting: Family Medicine

## 2023-08-17 NOTE — Telephone Encounter (Signed)
Name from pharmacy: Novolog U-100 Insulin aspart 100 unit/mL subcutaneous solution    To pharmacy: WRITTEN FOR VIALS BUT HE USES PENS.  PLEASE SEND UPDATED RX

## 2023-08-24 ENCOUNTER — Other Ambulatory Visit: Payer: Self-pay | Admitting: Family Medicine

## 2023-08-24 MED ORDER — NOVOLOG FLEXPEN 100 UNIT/ML ~~LOC~~ SOPN: 20.0000 [IU] | PEN_INJECTOR | Freq: Three times a day (TID) | SUBCUTANEOUS | 11 refills | Status: DC

## 2023-09-04 ENCOUNTER — Emergency Department (HOSPITAL_COMMUNITY)
Admission: EM | Admit: 2023-09-04 | Discharge: 2023-09-04 | Disposition: A | Discharge status: Home / Self Care | Payer: 59 | Attending: Emergency Medicine | Admitting: Emergency Medicine

## 2023-09-04 ENCOUNTER — Other Ambulatory Visit: Payer: Self-pay

## 2023-09-04 ENCOUNTER — Emergency Department (HOSPITAL_COMMUNITY): Payer: 59

## 2023-09-04 ENCOUNTER — Encounter (HOSPITAL_COMMUNITY): Payer: Self-pay

## 2023-09-04 DIAGNOSIS — Z91040 Latex allergy status: Secondary | ICD-10-CM | POA: Diagnosis not present

## 2023-09-04 DIAGNOSIS — I10 Essential (primary) hypertension: Secondary | ICD-10-CM | POA: Insufficient documentation

## 2023-09-04 DIAGNOSIS — W231XXA Caught, crushed, jammed, or pinched between stationary objects, initial encounter: Secondary | ICD-10-CM | POA: Insufficient documentation

## 2023-09-04 DIAGNOSIS — M79645 Pain in left finger(s): Secondary | ICD-10-CM | POA: Diagnosis not present

## 2023-09-04 DIAGNOSIS — S61022A Laceration with foreign body of left thumb without damage to nail, initial encounter: Secondary | ICD-10-CM | POA: Insufficient documentation

## 2023-09-04 DIAGNOSIS — E039 Hypothyroidism, unspecified: Secondary | ICD-10-CM | POA: Insufficient documentation

## 2023-09-04 DIAGNOSIS — Z8546 Personal history of malignant neoplasm of prostate: Secondary | ICD-10-CM | POA: Diagnosis not present

## 2023-09-04 DIAGNOSIS — S60932A Unspecified superficial injury of left thumb, initial encounter: Secondary | ICD-10-CM | POA: Diagnosis not present

## 2023-09-04 DIAGNOSIS — E109 Type 1 diabetes mellitus without complications: Secondary | ICD-10-CM | POA: Insufficient documentation

## 2023-09-04 DIAGNOSIS — Z794 Long term (current) use of insulin: Secondary | ICD-10-CM | POA: Diagnosis not present

## 2023-09-04 DIAGNOSIS — S61012A Laceration without foreign body of left thumb without damage to nail, initial encounter: Secondary | ICD-10-CM | POA: Diagnosis not present

## 2023-09-04 DIAGNOSIS — Z79899 Other long term (current) drug therapy: Secondary | ICD-10-CM | POA: Insufficient documentation

## 2023-09-04 MED ORDER — CEPHALEXIN 500 MG PO CAPS
500.0000 mg | ORAL_CAPSULE | Freq: Once | ORAL | Status: AC
  Administered 2023-09-04: 500 mg via ORAL
  Filled 2023-09-04: qty 1

## 2023-09-04 MED ORDER — LIDOCAINE HCL (PF) 1 % IJ SOLN
30.0000 mL | Freq: Once | INTRAMUSCULAR | Status: AC
  Administered 2023-09-04: 30 mL
  Filled 2023-09-04: qty 30

## 2023-09-04 MED ORDER — CEPHALEXIN 500 MG PO CAPS: 500.0000 mg | ORAL_CAPSULE | Freq: Three times a day (TID) | ORAL | 0 refills | Status: AC

## 2023-09-04 MED ORDER — CEPHALEXIN 500 MG PO CAPS: 500.0000 mg | ORAL_CAPSULE | Freq: Four times a day (QID) | ORAL | 0 refills | Status: DC

## 2023-09-04 NOTE — ED Triage Notes (Signed)
Pt reports he got his left thumb pinched between 2 objects.

## 2023-09-04 NOTE — ED Provider Notes (Signed)
[place] EMERGENCY DEPARTMENT AT [facility] Provider Note   CSN: [id number] Arrival date & time: 09/04/23  1419     History  Chief Complaint  Patient presents with   thumb laceration    Charles Foley is a 62 y.o. male.  He has PMH of type 1 diabetes, hypertension, hypothyroidism, history of prostate cancer.  Presents the ER today for left thumb injury.  He was working installing windows and states a piece of metal came down and hit the side of his finger pinching it between his finger on flat surface he was working on.  He is not on blood thinners.  He has noticed a there decreased range of motion, last shot was 4 years ago HPI     Home Medications Prior to Admission medications   Medication Sig Start Date End Date Taking? Authorizing Provider  ALPRAZolam Prudy Feeler) 0.5 MG tablet Take 1.5 tablets (0.75 mg total) by mouth at bedtime as needed. for sleep 04/14/23   Mechele Claude, MD  Aspirin-Caffeine (BC FAST PAIN RELIEF PO) Take 1 packet by mouth daily as needed (pain).    [provider]  atorvastatin (LIPITOR) 10 MG tablet Take 1 tablet (10 mg total) by mouth daily. 04/14/23   Mechele Claude, MD  cetirizine (ZYRTEC) 10 MG tablet Take 1 tablet (10 mg total) by mouth daily. 01/31/20   Mechele Claude, MD  clobetasol (TEMOVATE) 0.05 % external solution Apply 1 application topically 2 (two) times daily. Patient taking differently: Apply 1 application  topically 2 (two) times daily as needed (psoriasis). 08/19/21   Mechele Claude, MD  cyclobenzaprine (FLEXERIL) 10 MG tablet TAKE ONE TABLET BY MOUTH THREE TIMES DAILY AS NEEDED FOR MUSCLE SPASMS 03/21/22   Mechele Claude, MD  gabapentin (NEURONTIN) 300 MG capsule Take 1 capsule (300 mg total) by mouth 3 (three) times daily. 04/24/22   Mechele Claude, MD  insulin aspart (NOVOLOG FLEXPEN) 100 UNIT/ML FlexPen Inject 20 Units into the skin 3 (three) times daily with meals. 08/24/23   Mechele Claude, MD  insulin degludec (TRESIBA  FLEXTOUCH) 200 UNIT/ML FlexTouch Pen Inject 80 Units into the skin daily. 11/30/22 11/25/23  Mechele Claude, MD  levothyroxine (SYNTHROID) 25 MCG tablet Take 1 tablet (25 mcg total) by mouth daily. 04/14/23   Mechele Claude, MD  lisinopril-hydrochlorothiazide (ZESTORETIC) 20-12.5 MG tablet Take 1 tablet by mouth daily. 04/14/23   Mechele Claude, MD  metFORMIN (GLUCOPHAGE-XR) 500 MG 24 hr tablet Take 1,000 mg by mouth daily with breakfast.    [provider]  Multiple Vitamin (MULTIVITAMIN) tablet Take 1 tablet by mouth daily.    [provider]  pantoprazole (PROTONIX) 40 MG tablet Take 1 tablet (40 mg total) by mouth daily. 04/14/23   Mechele Claude, MD  traZODone (DESYREL) 150 MG tablet TAKE 1/3 UP TO 1 TABLET AT BEDTIME AS NEEDED FOR SLEEP 10/20/22   Mechele Claude, MD  triamcinolone cream (KENALOG) 0.1 % APPLY 1 APPLICATION TOPICALLY 2 TIMES DAILY Patient taking differently: Apply 1 Application topically 2 (two) times daily as needed (rash). 01/22/21   Gwenlyn Fudge, FNP      Allergies    Latex    Review of Systems   Review of Systems  Physical Exam Updated Vital Signs BP (!) 144/62 (BP Location: Right Arm)   Pulse (!) 58   Temp 98.1 F (36.7 C) (Oral)   Resp 16   Ht 5\' 9"  (1.753 m)   Wt 79.8 kg   SpO2 94%   BMI 25.99  kg/m  Physical Exam Vitals and nursing note reviewed.  Constitutional:      General: He is not in acute distress.    Appearance: He is well-developed.  HENT:     Head: Normocephalic and atraumatic.  Eyes:     Conjunctiva/sclera: Conjunctivae normal.  Cardiovascular:     Rate and Rhythm: Normal rate and regular rhythm.     Heart sounds: No murmur heard. Pulmonary:     Effort: Pulmonary effort is normal. No respiratory distress.     Breath sounds: Normal breath sounds.  Abdominal:     Palpations: Abdomen is soft.     Tenderness: There is no abdominal tenderness.  Musculoskeletal:        General: No swelling.     Cervical back: Neck supple.   Skin:    General: Skin is warm and dry.     Capillary Refill: Capillary refill takes less than 2 seconds.  Neurological:     Mental Status: He is alert.  Psychiatric:        Mood and Affect: Mood normal.     ED Results / Procedures / Treatments   Labs (all labs ordered are listed, but only abnormal results are displayed) Labs Reviewed - No data to display  EKG None  Radiology No results found.  Procedures .Laceration Repair  Date/Time: 09/04/2023 6:59 PM  Performed by: Ma Rings, PA-C Authorized by: Ma Rings, PA-C   Consent:    Consent obtained:  Verbal   Consent given by:  Patient   Risks discussed:  Infection, pain, retained foreign body, need for additional repair, nerve damage, poor wound healing and vascular damage Universal protocol:    Procedure explained and questions answered to patient or proxy's satisfaction: yes     Imaging studies available: yes     Patient identity confirmed:  Verbally with patient Anesthesia:    Anesthesia method: digital block 1% lidocaine without epi. Laceration details:    Location:  Finger   Finger location:  L thumb   Length (cm):  4 Pre-procedure details:    Preparation:  Imaging obtained to evaluate for foreign bodies Exploration:    Hemostasis achieved with:  Direct pressure   Foreign body noted: questionable fb.   Treatment:    Area cleansed with:  Povidone-iodine and chlorhexidine   Amount of cleaning:  Extensive   Irrigation solution:  Tap water   Irrigation method:  Tap   Visualized foreign bodies/material removed: no     Debridement:  None Skin repair:    Repair method:  Sutures   Suture size:  5-0   Suture material:  Prolene   Suture technique:  Simple interrupted   Number of sutures:  5 Approximation:    Approximation:  Close Repair type:    Repair type:  Intermediate Post-procedure details:    Dressing:  Antibiotic ointment and bulky dressing   Procedure completion:  Tolerated well, no  immediate complications     Medications Ordered in ED Medications - No data to display  ED Course/ Medical Decision Making/ A&P                                 Medical Decision Making Differential diagnosis: Fracture, laceration, retained foreign body, other Course: Patient had a crush injury to the side of his finger has a rectangular shaped laceration with some medialization to the corners of the wound.  It is adjacent to his  lateral nail fold on the ulnar aspect of the thumb but no damage to the nailbed noted.  Patient can flex and extend the thumb against resistance, no signs of neurovascular compromise on exam.  X-ray shows no fracture but questionable 1 to 2 mm density that could be a small foreign body.  Wound was copiously irrigated after being thoroughly cleaned and I tacked down the free edges of the laceration and bandaged it.  Advised on wound care at home.  Given patient is diabetic and this is a crush injury with some devitalization of the tissue discussed there may be some delayed healing is important for him to follow-up with orthopedics.  Given additional gauze for wound care at home.  Patient's tetanus shot is up-to-date, put on prophylactic antibiotics given dirty wound with possible foreign body and patient having pre-existing diabetes  Amount and/or Complexity of Data Reviewed Radiology: ordered.  Risk Prescription drug management.           Final Clinical Impression(s) / ED Diagnoses Final diagnoses:  None    Rx / DC Orders ED Discharge Orders     None         Josem Kaufmann 09/04/23 1914    Pricilla Loveless, MD 09/05/23 1000

## 2023-09-04 NOTE — Discharge Instructions (Signed)
Is a pleasure to take care of you today.  You were seen for a laceration of your left thumb after a crush injury.  Your x-ray shows a potential small foreign body that is retained.  We cleaned your wound and repaired what we could with sutures.  Keep the area clean dry and covered and follow-up early next week with orthopedics.  As discussed this is her corners this may have poor healing.  It is important that you follow-up.  Come back if you have redness, fever or drainage or other worrisome changes.

## 2023-09-07 ENCOUNTER — Telehealth: Payer: Self-pay | Admitting: Orthopedic Surgery

## 2023-09-07 NOTE — Telephone Encounter (Signed)
Spoke w/the pt, he went to the ED at [facility] on 12/13 for his lt thumb.  The ED told him to schedule w/us, but he said he's doing good and doesn't have the money to come and see a specialist.  He stated that he is going to go to [facility] and if he needs [place] he'll call back to schedule.

## 2023-09-11 ENCOUNTER — Ambulatory Visit (INDEPENDENT_AMBULATORY_CARE_PROVIDER_SITE_OTHER): Payer: 59 | Admitting: Nurse Practitioner

## 2023-09-11 ENCOUNTER — Encounter: Payer: Self-pay | Admitting: Nurse Practitioner

## 2023-09-11 VITALS — BP 150/55 | HR 53 | Temp 97.2°F | Ht 69.0 in | Wt 189.0 lb

## 2023-09-11 DIAGNOSIS — S61012D Laceration without foreign body of left thumb without damage to nail, subsequent encounter: Secondary | ICD-10-CM | POA: Diagnosis not present

## 2023-09-11 NOTE — Progress Notes (Signed)
   Subjective:    Patient ID: Charles Foley, male    DOB: 10/30/1960, 62 y.o.   MRN: [id number]   Chief Complaint: Hospitalization Follow-up   HPI  Patient smashed his thumb with a saw. Had to go to the ED and stitches were placed. He is here today for recheck and stitch removal.  Patient Active Problem List   Diagnosis Date Noted   Mixed hyperlipidemia 08/13/2023   Lumbar radiculopathy 06/11/2022   Acute non-recurrent maxillary sinusitis 01/28/2021   Callus of foot 03/30/2019   Seborrhea 03/30/2019   GAD (generalized anxiety disorder) 10/01/2018   Hypothyroidism 10/01/2018   Rotator cuff tear arthropathy of right shoulder 04/29/2017   Essential hypertension 03/17/2017   Type 1 diabetes mellitus without complication (HCC) 08/12/2016   Personal history of prostate cancer 08/12/2016   Chronic allergic rhinitis due to pollen 08/12/2016   Degenerative joint disease involving multiple joints 08/12/2016      Review of Systems  Constitutional:  Negative for diaphoresis.  Eyes:  Negative for pain.  Respiratory:  Negative for shortness of breath.   Cardiovascular:  Negative for chest pain, palpitations and leg swelling.  Gastrointestinal:  Negative for abdominal pain.  Endocrine: Negative for polydipsia.  Skin:  Negative for rash.  Neurological:  Negative for dizziness, weakness and headaches.  Hematological:  Does not bruise/bleed easily.  All other systems reviewed and are negative.      Objective:   Physical Exam Constitutional:      Appearance: Normal appearance.  Cardiovascular:     Rate and Rhythm: Normal rate and regular rhythm.     Heart sounds: Normal heart sounds.  Pulmonary:     Effort: Pulmonary effort is normal.     Breath sounds: Normal breath sounds.  Skin:    General: Skin is warm.  Neurological:     General: No focal deficit present.     Mental Status: He is alert and oriented to person, place, and time.  Psychiatric:        Mood and Affect: Mood  normal.        Behavior: Behavior normal.    BP (!) 150/55   Pulse (!) 53   Temp (!) 97.2 F (36.2 C) (Temporal)   Ht 5\' 9"  (1.753 m)   Wt 189 lb (85.7 kg)   SpO2 99%   BMI 27.91 kg/m         Assessment & Plan:   Charles Foley in today with chief complaint of Hospitalization Follow-up   1. Laceration of left thumb without foreign body without damage to nail, subsequent encounter (Primary) Keep clean and dry Suture removal on monday    The above assessment and management plan was discussed with the patient. The patient verbalized understanding of and has agreed to the management plan. Patient is aware to call the clinic if symptoms persist or worsen. Patient is aware when to return to the clinic for a follow-up visit. Patient educated on when it is appropriate to go to the emergency department.   Mary-Margaret Daphine Deutscher, FNP

## 2023-09-11 NOTE — Patient Instructions (Signed)

## 2023-09-14 ENCOUNTER — Encounter: Payer: Self-pay | Admitting: Nurse Practitioner

## 2023-09-14 ENCOUNTER — Ambulatory Visit (INDEPENDENT_AMBULATORY_CARE_PROVIDER_SITE_OTHER): Payer: 59 | Admitting: Nurse Practitioner

## 2023-09-14 VITALS — BP 142/56 | HR 56 | Temp 98.6°F | Ht 69.0 in | Wt 189.0 lb

## 2023-09-14 DIAGNOSIS — Z4802 Encounter for removal of sutures: Secondary | ICD-10-CM

## 2023-09-14 NOTE — Progress Notes (Signed)
   Subjective:    Patient ID: Charles Foley, male    DOB: 11-Sep-1961, 62 y.o.   MRN: [id number]   Chief Complaint: suture removal  HPI  Patient is in today for suture removal. He had a crushing injury to his thumb. He is here today for suture removal. Patient Active Problem List   Diagnosis Date Noted   Mixed hyperlipidemia 08/13/2023   Lumbar radiculopathy 06/11/2022   Acute non-recurrent maxillary sinusitis 01/28/2021   Callus of foot 03/30/2019   Seborrhea 03/30/2019   GAD (generalized anxiety disorder) 10/01/2018   Hypothyroidism 10/01/2018   Rotator cuff tear arthropathy of right shoulder 04/29/2017   Essential hypertension 03/17/2017   Type 1 diabetes mellitus without complication (HCC) 08/12/2016   Personal history of prostate cancer 08/12/2016   Chronic allergic rhinitis due to pollen 08/12/2016   Degenerative joint disease involving multiple joints 08/12/2016       Review of Systems     Objective:   Physical Exam Constitutional:      Appearance: Normal appearance.  Cardiovascular:     Rate and Rhythm: Normal rate and regular rhythm.     Heart sounds: Normal heart sounds.  Pulmonary:     Effort: Pulmonary effort is normal.     Breath sounds: Normal breath sounds.  Skin:    General: Skin is warm.     Findings: Lesion present.     Comments: Wound edges well approximated. No erythema , no drainage  Neurological:     General: No focal deficit present.     Mental Status: He is alert and oriented to person, place, and time.  Psychiatric:        Mood and Affect: Mood normal.        Behavior: Behavior normal.             Assessment & Plan:   Charles Foley in today with chief complaint of No chief complaint on file.   1. Visit for suture removal (Primary) Keep clean and dry Watch for signs of infection RTO prn    The above assessment and management plan was discussed with the patient. The patient verbalized understanding of and has agreed to the  management plan. Patient is aware to call the clinic if symptoms persist or worsen. Patient is aware when to return to the clinic for a follow-up visit. Patient educated on when it is appropriate to go to the emergency department.   Mary-Margaret Daphine Deutscher, FNP

## 2023-09-14 NOTE — Patient Instructions (Signed)
Suture Removal, Care After The following information offers guidance on how to care for yourself after your procedure. Your health care provider may also give you more specific instructions. If you have problems or questions, contact your health care provider. What can I expect after the procedure? After your stitches (sutures) are removed, it is common to have: Some discomfort and swelling in the area. Slight redness in the area. Follow these instructions at home: If you have a dressing: Wash your hands with soap and water for at least 20 seconds before and after you change your bandage (dressing). If soap and water are not available, use hand sanitizer. Change your dressing as told by your health care provider. If your dressing becomes wet or dirty, or develops a bad smell, change it as soon as possible. If your dressing sticks to your skin, pour warm, clean water over it until it loosens and can be removed without pulling apart the wound edges. Pat the area dry with a soft, clean towel. Do not rub the wound because that may cause bleeding. Wound care  Check your wound every day for signs of infection. Check for: More redness, swelling, or pain. Fluid or blood. New warmth, a rash, or hardness at the wound site. Pus or a bad smell. Wash your hands with soap and water for at least 20 seconds before and after touching your wound. If soap and water are not available, use hand sanitizer. Keep the wound area dry and clean. Clean and pat the wound dry as told by your health care provider. Apply cream or ointment only as told by your health care provider. If skin glue or adhesive strips were applied after sutures were removed, leave these closures in place. They may need to stay in place for 2 weeks or longer. If adhesive strip edges start to loosen and curl up, you may trim the loose edges. Do not remove adhesive strips completely unless your health care provider tells you to do that. Continue to  protect the wound from injury. Do not pick at your wound. Picking can cause an infection. Bathing Do not take baths, swim, or use a hot tub until your health care provider approves. Ask your health care provider if you may take showers. Follow these steps for showering: If you have a dressing, remove it before getting into the shower. In the shower, allow soapy water to get on the wound. Avoid scrubbing the wound. When you get out of the shower, dry the wound by patting it with a clean towel. Reapply a dressing over the wound, if needed. Scar care When your wound has completely healed, help decrease the size of your scar by: Wearing sunscreen over the scar or covering it with clothing when you are outside. New scars get sunburned easily, which can make scarring worse. Gently massaging the scarred area. This can decrease scar thickness. General instructions Take over-the-counter and prescription medicines only as told by your health care provider. Keep all follow-up visits. This is important. Contact a health care provider if: You have more redness, swelling, or pain around your wound. You have fluid or blood coming from your wound. You have new warmth, a rash, or hardness at the wound site. You have pus or a bad smell coming from your wound. Your wound opens up. Get help right away if: You have a fever or chills. You have red streaks coming from your wound. Summary After your sutures are removed, it is common to have some discomfort  and swelling in the area. Wash your hands with soap and water before you change your bandage (dressing). Keep the wound area dry and clean. Do not take baths, swim, or use a hot tub until your health care provider approves. This information is not intended to replace advice given to you by your health care provider. Make sure you discuss any questions you have with your health care provider. Document Revised: 01/01/2021 Document Reviewed:  01/01/2021 Elsevier Patient Education  2024 [employer].

## 2023-11-10 ENCOUNTER — Ambulatory Visit: Payer: 59 | Admitting: Family Medicine

## 2023-11-12 ENCOUNTER — Other Ambulatory Visit: Payer: Self-pay | Admitting: Family Medicine

## 2023-11-12 NOTE — Telephone Encounter (Signed)
 Last OV 04/14/23. Last RF 10/20/22. Next OV 12/07/23

## 2023-11-13 ENCOUNTER — Other Ambulatory Visit: Payer: Self-pay | Admitting: Family Medicine

## 2023-11-16 ENCOUNTER — Other Ambulatory Visit: Payer: Self-pay | Admitting: Family Medicine

## 2023-11-16 DIAGNOSIS — F411 Generalized anxiety disorder: Secondary | ICD-10-CM

## 2023-11-16 NOTE — Telephone Encounter (Signed)
 Copied from CRM [id number]. Topic: Clinical - Medication Refill >> Nov 16, 2023  3:58 PM Antwanette L wrote: Most Recent Primary Care Visit:  Provider: Bennie Pierini  Department: [facility]  Visit Type: OFFICE VISIT  Date: 09/14/2023  Medication: ALPRAZolam Prudy Feeler) 0.5 MG tablet  Has the patient contacted their pharmacy? Yes.  Is this the correct pharmacy for this prescription? Yes  [facility] [place], [place] - [address] [place] [address] Phone: [phone] Fax: [phone]   Has the prescription been filled recently? No  Is the patient out of the medication? Yes  Has the patient been seen for an appointment in the last year OR does the patient have an upcoming appointment? Yes  Can we respond through MyChart? No. Please contact patient by phone at [phone]  Agent: Please be advised that Rx refills may take up to 3 business days. We ask that you follow-up with your pharmacy.

## 2023-11-16 NOTE — Telephone Encounter (Signed)
 Last Fill: 04/14/23  Last OV: 09/14/23 Next OV: 12/07/23  Routing to provider for review/authorization.

## 2023-11-18 ENCOUNTER — Telehealth: Payer: Self-pay

## 2023-11-18 NOTE — Telephone Encounter (Signed)
 Copied from CRM [id number]. Topic: Clinical - Prescription Issue >> Nov 18, 2023  2:24 PM Kristie Cowman wrote: Reason for CRM: Patient is wondering why his prescription for Xanax has not been called into his pharmacy as of yet.  He states this is his 2nd or 3rd attempt at getting this medication refilled with no success.  [phone] - Best contact number

## 2023-11-19 ENCOUNTER — Other Ambulatory Visit: Payer: Self-pay | Admitting: Family Medicine

## 2023-11-19 DIAGNOSIS — F411 Generalized anxiety disorder: Secondary | ICD-10-CM

## 2023-11-19 MED ORDER — ALPRAZOLAM 0.5 MG PO TABS: 0.7500 mg | ORAL_TABLET | Freq: Every evening | ORAL | 0 refills | Status: DC | PRN

## 2023-11-19 NOTE — Telephone Encounter (Signed)
 Please let the patient know that I sent their prescription to their pharmacy. Thanks, WS

## 2023-11-19 NOTE — Telephone Encounter (Signed)
 He is out of refills and has to be seen to get a new prescription. If he had to be rescheduled due to my illness I will call in enough o last until his next appointment

## 2023-11-19 NOTE — Telephone Encounter (Signed)
 Left detailed message stating medication was called in. LS

## 2023-12-07 ENCOUNTER — Ambulatory Visit (INDEPENDENT_AMBULATORY_CARE_PROVIDER_SITE_OTHER): Payer: Self-pay | Admitting: Family Medicine

## 2023-12-07 VITALS — BP 168/64 | HR 58 | Temp 97.8°F | Ht 69.0 in | Wt 188.0 lb

## 2023-12-07 DIAGNOSIS — M5416 Radiculopathy, lumbar region: Secondary | ICD-10-CM | POA: Diagnosis not present

## 2023-12-07 DIAGNOSIS — I10 Essential (primary) hypertension: Secondary | ICD-10-CM | POA: Diagnosis not present

## 2023-12-07 DIAGNOSIS — E039 Hypothyroidism, unspecified: Secondary | ICD-10-CM | POA: Diagnosis not present

## 2023-12-07 DIAGNOSIS — R21 Rash and other nonspecific skin eruption: Secondary | ICD-10-CM

## 2023-12-07 DIAGNOSIS — F411 Generalized anxiety disorder: Secondary | ICD-10-CM

## 2023-12-07 DIAGNOSIS — E782 Mixed hyperlipidemia: Secondary | ICD-10-CM

## 2023-12-07 DIAGNOSIS — E109 Type 1 diabetes mellitus without complications: Secondary | ICD-10-CM | POA: Diagnosis not present

## 2023-12-07 LAB — BAYER DCA HB A1C WAIVED: HB A1C (BAYER DCA - WAIVED): 7.7 % — ABNORMAL HIGH (ref 4.8–5.6)

## 2023-12-07 MED ORDER — ALPRAZOLAM 0.5 MG PO TABS: 0.7500 mg | ORAL_TABLET | Freq: Every evening | ORAL | 2 refills | Status: DC | PRN

## 2023-12-07 MED ORDER — CYCLOBENZAPRINE HCL 10 MG PO TABS: 10.0000 mg | ORAL_TABLET | Freq: Three times a day (TID) | ORAL | 3 refills | Status: DC | PRN

## 2023-12-07 MED ORDER — CLOBETASOL PROPIONATE 0.05 % EX SOLN: 1.0000 | Freq: Two times a day (BID) | CUTANEOUS | 5 refills | Status: DC

## 2023-12-07 MED ORDER — TRIAMCINOLONE ACETONIDE 0.1 % EX CREA: TOPICAL_CREAM | Freq: Two times a day (BID) | CUTANEOUS | 2 refills | Status: DC

## 2023-12-07 NOTE — Progress Notes (Signed)
 Subjective:  Patient ID: Charles Foley,  male    DOB: 1961/08/29  Age: 63 y.o.    CC: Medical Management of Chronic Issues   HPI [name] presents for  follow-up of hypertension. Patient has no history of headache chest pain or shortness of breath or recent cough. Patient also denies symptoms of TIA such as numbness weakness lateralizing. Patient denies side effects from medication. States taking it regularly.  Patient also  in for follow-up of elevated cholesterol. Doing well without complaints on current medication. Denies side effects  including myalgia and arthralgia and nausea. Also in today for liver function testing. Currently no chest pain, shortness of breath or other cardiovascular related symptoms noted.  Follow-up of diabetes. Patient does check blood sugar at home. Readings run between 150-160 fasting  and 70- 160. Rarel over 200. Patient denies symptoms such as excessive hunger or urinary frequency, excessive hunger, nausea No significant hypoglycemic spells notyed. Medications reviewed. Pt reports taking them regularly. Pt. denies complication/adverse reaction today.    History [name] has a past medical history of Acute pain of right knee (06/17/2017), Allergic rhinitis, Anxiety, Arthritis, Cancer (HCC) (2012), Chest pain (11/02/2014), Diabetes mellitus without complication (HCC), Hypertension, and Hypothyroidism.   He has a past surgical history that includes Prostatectomy (2012); ORIF facial fracture (1988); Knee arthroscopy (Bilateral); Colonoscopy; Lumbar laminectomy/decompression microdiscectomy (N/A, 10/11/2020); Tonsillectomy; and Transforaminal lumbar interbody fusion w/ mis 1 level (Right, 06/11/2022).   His family history includes Diabetes in his mother; Heart disease in his mother.He reports that he quit smoking about 14 years ago. His smoking use included cigarettes. He quit smokeless tobacco use about 20 years ago.  His smokeless tobacco use included chew. He  reports that he does not currently use alcohol. He reports that he does not use drugs.  Current Outpatient Medications on File Prior to Visit  Medication Sig Dispense Refill   ALPRAZolam (XANAX) 0.5 MG tablet Take 1.5 tablets (0.75 mg total) by mouth at bedtime as needed. for sleep 27 tablet 0   Aspirin-Caffeine (BC FAST PAIN RELIEF PO) Take 1 packet by mouth daily as needed (pain).     atorvastatin (LIPITOR) 10 MG tablet Take 1 tablet (10 mg total) by mouth daily. 90 tablet 3   cetirizine (ZYRTEC) 10 MG tablet Take 1 tablet (10 mg total) by mouth daily. 90 tablet 3   clobetasol (TEMOVATE) 0.05 % external solution Apply 1 application topically 2 (two) times daily. (Patient taking differently: Apply 1 application  topically 2 (two) times daily as needed (psoriasis).) 50 mL 5   glucose blood (ACCU-CHEK GUIDE TEST) test strip Check BS up to 6 times daily Dx E10.9 200 strip 11   insulin aspart (NOVOLOG FLEXPEN) 100 UNIT/ML FlexPen Inject 20 Units into the skin 3 (three) times daily with meals. 30 mL 11   levothyroxine (SYNTHROID) 25 MCG tablet Take 1 tablet (25 mcg total) by mouth daily. 90 tablet 3   lisinopril-hydrochlorothiazide (ZESTORETIC) 20-12.5 MG tablet Take 1 tablet by mouth daily. 90 tablet 3   Multiple Vitamin (MULTIVITAMIN) tablet Take 1 tablet by mouth daily.     traZODone (DESYREL) 150 MG tablet TAKE 1/3 UP TO 1 TABLET AT BEDTIME AS NEEDED FOR SLEEP 90 tablet 3   TRESIBA FLEXTOUCH 200 UNIT/ML FlexTouch Pen SMARTSIG:80 Unit(s) Topical Daily     cyclobenzaprine (FLEXERIL) 10 MG tablet TAKE ONE TABLET BY MOUTH THREE TIMES DAILY AS NEEDED FOR MUSCLE SPASMS 30 tablet 0   No current facility-administered medications on file prior  to visit.    ROS Review of Systems  Constitutional:  Negative for fever.  Respiratory:  Negative for shortness of breath.   Cardiovascular:  Negative for chest pain.  Musculoskeletal:  Positive for back pain (much diminished since surgery. Off of gabapentin.  Wants to maintain flexeril as prn.). Negative for arthralgias.  Skin:  Positive for rash (chronic psoriasis).    Objective:  BP (!) 168/64   Pulse (!) 58   Temp 97.8 F (36.6 C)   Ht 5\' 9"  (1.753 m)   Wt 188 lb (85.3 kg)   SpO2 98%   BMI 27.76 kg/m   BP Readings from Last 3 Encounters:  12/07/23 (!) 168/64  09/14/23 (!) 142/56  09/11/23 (!) 150/55    Wt Readings from Last 3 Encounters:  12/07/23 188 lb (85.3 kg)  09/14/23 189 lb (85.7 kg)  09/11/23 189 lb (85.7 kg)     Physical Exam Vitals reviewed.  Constitutional:      Appearance: He is well-developed.  HENT:     Head: Normocephalic and atraumatic.     Right Ear: External ear normal.     Left Ear: External ear normal.     Mouth/Throat:     Pharynx: No oropharyngeal exudate or posterior oropharyngeal erythema.  Eyes:     Pupils: Pupils are equal, round, and reactive to light.  Cardiovascular:     Rate and Rhythm: Normal rate and regular rhythm.     Heart sounds: No murmur heard. Pulmonary:     Effort: No respiratory distress.     Breath sounds: Normal breath sounds.  Musculoskeletal:     Cervical back: Normal range of motion and neck supple.  Neurological:     Mental Status: He is alert and oriented to person, place, and time.     Diabetic Foot Exam - Simple   No data filed     Lab Results  Component Value Date   HGBA1C 8.0 (H) 08/13/2023   HGBA1C 7.8 (H) 04/14/2023   HGBA1C 7.8 (H) 10/14/2022    Assessment & Plan:   [name] was seen today for medical management of chronic issues.  Diagnoses and all orders for this visit:  Hypothyroidism, unspecified type -     TSH + free T4  Type 1 diabetes mellitus without complication (HCC) -     Bayer DCA Hb A1c Waived -     Microalbumin/Creatinine Ratio, Urine  Essential hypertension -     CBC with Differential/Platelet -     CMP14+EGFR  Mixed hyperlipidemia -     Lipid panel  GAD (generalized anxiety disorder)  Rash -      triamcinolone cream (KENALOG) 0.1 %; Apply topically 2 (two) times daily.  Acute right-sided low back pain with right-sided sciatica  Lumbar nerve root impingement   I have discontinued Erubiel Berkshire "[name]"'s gabapentin, metFORMIN, and pantoprazole. I have also changed his triamcinolone cream. Additionally, I am having him maintain his multivitamin, cetirizine, clobetasol, cyclobenzaprine, Aspirin-Caffeine (BC FAST PAIN RELIEF PO), atorvastatin, levothyroxine, lisinopril-hydrochlorothiazide, NovoLOG FlexPen, traZODone, Accu-Chek Guide Test, ALPRAZolam, and Tresiba FlexTouch.  Meds ordered this encounter  Medications   triamcinolone cream (KENALOG) 0.1 %    Sig: Apply topically 2 (two) times daily.    Dispense:  160 g    Refill:  2    80GMS IS NOT LASTING LONG ENOUGH AS PT IS USING ON WHOLE BODY. CONSIDER NEW RX FOR LARGER AMOUNT?     Follow-up: Return in about 3 months (around 03/08/2024).  Mechele Claude, M.D.

## 2023-12-08 ENCOUNTER — Encounter: Payer: Self-pay | Admitting: Family Medicine

## 2023-12-08 LAB — CMP14+EGFR
ALT: 24 IU/L (ref 0–44)
AST: 20 IU/L (ref 0–40)
Albumin: 4.5 g/dL (ref 3.9–4.9)
Alkaline Phosphatase: 67 IU/L (ref 44–121)
BUN/Creatinine Ratio: 15 (ref 10–24)
BUN: 13 mg/dL (ref 8–27)
Bilirubin Total: 0.7 mg/dL (ref 0.0–1.2)
CO2: 24 mmol/L (ref 20–29)
Calcium: 9.7 mg/dL (ref 8.6–10.2)
Chloride: 101 mmol/L (ref 96–106)
Creatinine, Ser: 0.87 mg/dL (ref 0.76–1.27)
Globulin, Total: 2.5 g/dL (ref 1.5–4.5)
Glucose: 199 mg/dL — ABNORMAL HIGH (ref 70–99)
Potassium: 4.7 mmol/L (ref 3.5–5.2)
Sodium: 139 mmol/L (ref 134–144)
Total Protein: 7 g/dL (ref 6.0–8.5)
eGFR: 96 mL/min/1.73

## 2023-12-08 LAB — CBC WITH DIFFERENTIAL/PLATELET
Basophils Absolute: 0.1 x10E3/uL (ref 0.0–0.2)
Basos: 1 %
EOS (ABSOLUTE): 0.2 x10E3/uL (ref 0.0–0.4)
Eos: 3 %
Hematocrit: 39.8 % (ref 37.5–51.0)
Hemoglobin: 13.2 g/dL (ref 13.0–17.7)
Immature Grans (Abs): 0 x10E3/uL (ref 0.0–0.1)
Immature Granulocytes: 0 %
Lymphocytes Absolute: 2.3 x10E3/uL (ref 0.7–3.1)
Lymphs: 33 %
MCH: 30.9 pg (ref 26.6–33.0)
MCHC: 33.2 g/dL (ref 31.5–35.7)
MCV: 93 fL (ref 79–97)
Monocytes Absolute: 0.4 x10E3/uL (ref 0.1–0.9)
Monocytes: 6 %
Neutrophils Absolute: 4 x10E3/uL (ref 1.4–7.0)
Neutrophils: 57 %
Platelets: 262 x10E3/uL (ref 150–450)
RBC: 4.27 x10E6/uL (ref 4.14–5.80)
RDW: 12.7 % (ref 11.6–15.4)
WBC: 7 x10E3/uL (ref 3.4–10.8)

## 2023-12-08 LAB — TSH+FREE T4
Free T4: 1.26 ng/dL (ref 0.82–1.77)
TSH: 1.76 u[IU]/mL (ref 0.450–4.500)

## 2023-12-08 LAB — LIPID PANEL
Chol/HDL Ratio: 2.3 ratio (ref 0.0–5.0)
Cholesterol, Total: 139 mg/dL (ref 100–199)
HDL: 61 mg/dL
LDL Chol Calc (NIH): 65 mg/dL (ref 0–99)
Triglycerides: 65 mg/dL (ref 0–149)
VLDL Cholesterol Cal: 13 mg/dL (ref 5–40)

## 2023-12-08 LAB — MICROALBUMIN / CREATININE URINE RATIO
Creatinine, Urine: 37.1 mg/dL
Microalb/Creat Ratio: <8 mg/g{creat} (ref 0–29)
Microalbumin, Urine: <3 ug/mL

## 2023-12-14 ENCOUNTER — Other Ambulatory Visit: Payer: Self-pay | Admitting: Family Medicine

## 2024-01-19 ENCOUNTER — Other Ambulatory Visit: Payer: Self-pay

## 2024-01-19 DIAGNOSIS — Z1211 Encounter for screening for malignant neoplasm of colon: Secondary | ICD-10-CM

## 2024-01-21 ENCOUNTER — Encounter: Payer: Self-pay | Admitting: *Deleted

## 2024-02-17 ENCOUNTER — Other Ambulatory Visit (HOSPITAL_COMMUNITY): Payer: Self-pay

## 2024-02-17 ENCOUNTER — Telehealth: Payer: Self-pay

## 2024-02-17 NOTE — Telephone Encounter (Signed)
 Pharmacy Patient Advocate Encounter   Received notification from Onbase that prior authorization for Novolog  100 unit/ml pen is required/requested.   Insurance verification completed.   The patient is insured through [employer] .   Per test claim:  Humalog  or Lyumjev  is preferred by the insurance.  If suggested medication is appropriate, Please send in a new RX and discontinue this one. If not, please advise as to why it's not appropriate so that we may request a Prior Authorization. Please note, some preferred medications may still require a PA.  If the suggested medications have not been trialed and there are no contraindications to their use, the PA will not be submitted, as it will not be approved.

## 2024-02-19 ENCOUNTER — Other Ambulatory Visit (HOSPITAL_COMMUNITY): Payer: Self-pay

## 2024-03-07 ENCOUNTER — Other Ambulatory Visit: Payer: Self-pay | Admitting: Family Medicine

## 2024-03-07 DIAGNOSIS — E109 Type 1 diabetes mellitus without complications: Secondary | ICD-10-CM

## 2024-03-10 ENCOUNTER — Ambulatory Visit: Payer: Self-pay | Admitting: Family Medicine

## 2024-03-10 ENCOUNTER — Encounter: Payer: Self-pay | Admitting: Family Medicine

## 2024-03-10 ENCOUNTER — Ambulatory Visit (INDEPENDENT_AMBULATORY_CARE_PROVIDER_SITE_OTHER): Admitting: Family Medicine

## 2024-03-10 VITALS — BP 133/65 | HR 69 | Temp 98.6°F | Ht 69.0 in | Wt 179.0 lb

## 2024-03-10 DIAGNOSIS — F411 Generalized anxiety disorder: Secondary | ICD-10-CM

## 2024-03-10 DIAGNOSIS — E782 Mixed hyperlipidemia: Secondary | ICD-10-CM

## 2024-03-10 DIAGNOSIS — E039 Hypothyroidism, unspecified: Secondary | ICD-10-CM | POA: Diagnosis not present

## 2024-03-10 DIAGNOSIS — E109 Type 1 diabetes mellitus without complications: Secondary | ICD-10-CM | POA: Diagnosis not present

## 2024-03-10 DIAGNOSIS — I10 Essential (primary) hypertension: Secondary | ICD-10-CM

## 2024-03-10 LAB — BAYER DCA HB A1C WAIVED: HB A1C (BAYER DCA - WAIVED): 7.8 % — ABNORMAL HIGH (ref 4.8–5.6)

## 2024-03-10 LAB — CMP14+EGFR
ALT: 25 IU/L (ref 0–44)
AST: 25 IU/L (ref 0–40)
Albumin: 4.2 g/dL (ref 3.9–4.9)
Alkaline Phosphatase: 63 IU/L (ref 44–121)
BUN/Creatinine Ratio: 18 (ref 10–24)
BUN: 18 mg/dL (ref 8–27)
Bilirubin Total: 0.7 mg/dL (ref 0.0–1.2)
CO2: 21 mmol/L (ref 20–29)
Calcium: 9.5 mg/dL (ref 8.6–10.2)
Chloride: 102 mmol/L (ref 96–106)
Creatinine, Ser: 0.99 mg/dL (ref 0.76–1.27)
Globulin, Total: 2.5 g/dL (ref 1.5–4.5)
Glucose: 106 mg/dL — ABNORMAL HIGH (ref 70–99)
Potassium: 4.6 mmol/L (ref 3.5–5.2)
Sodium: 137 mmol/L (ref 134–144)
Total Protein: 6.7 g/dL (ref 6.0–8.5)
eGFR: 85 mL/min/{1.73_m2} (ref >59)

## 2024-03-10 MED ORDER — TRESIBA FLEXTOUCH 200 UNIT/ML ~~LOC~~ SOPN: 60.0000 [IU] | PEN_INJECTOR | Freq: Two times a day (BID) | SUBCUTANEOUS | 3 refills | Status: DC

## 2024-03-10 NOTE — Progress Notes (Unsigned)
 Subjective:  Patient ID: Charles Foley,  male    DOB: 1961-07-11  Age: 63 y.o.    CC: Medical Management of Chronic Issues (3 month)   HPI Charles Foley presents for  follow-up of hypertension. Patient has no history of headache chest pain or shortness of breath or recent cough. Patient also denies symptoms of TIA such as numbness weakness lateralizing. Patient denies side effects from medication. States taking it regularly.  Patient also  in for follow-up of elevated cholesterol. Doing well without complaints on current medication. Denies side effects  including myalgia and arthralgia and nausea. Also in today for liver function testing. Currently no chest pain, shortness of breath or other cardiovascular related symptoms noted.  Follow-up of diabetes. Patient does check blood sugar at home. Readings run be around 112-118 fasting and 150 postprandial Patient denies symptoms such as excessive hunger or urinary frequency, excessive hunger, nausea No significant hypoglycemic spells noted. Medications reviewed. Pt reports taking them regularly. Pt. denies complication/adverse reaction today.    History Charles Foley has a past medical history of Acute pain of right knee (06/17/2017), Allergic rhinitis, Anxiety, Arthritis, Cancer (HCC) (2012), Chest pain (11/02/2014), Diabetes mellitus without complication (HCC), Hypertension, and Hypothyroidism.   He has a past surgical history that includes Prostatectomy (2012); ORIF facial fracture (1988); Knee arthroscopy (Bilateral); Colonoscopy; Lumbar laminectomy/decompression microdiscectomy (N/A, 10/11/2020); Tonsillectomy; and Transforaminal lumbar interbody fusion w/ mis 1 level (Right, 06/11/2022).   His family history includes Diabetes in his mother; Heart disease in his mother.He reports that he quit smoking about 14 years ago. His smoking use included cigarettes. He quit smokeless tobacco use about 21 years ago.  His smokeless tobacco use included chew. He  reports that he does not currently use alcohol. He reports that he does not use drugs.  Current Outpatient Medications on File Prior to Visit  Medication Sig Dispense Refill  . ALPRAZolam  (XANAX ) 0.5 MG tablet Take 1.5 tablets (0.75 mg total) by mouth at bedtime as needed. for sleep 30 tablet 2  . Aspirin-Caffeine (BC FAST PAIN RELIEF PO) Take 1 packet by mouth daily as needed (pain).    . atorvastatin  (LIPITOR) 10 MG tablet TAKE ONE TABLET BY MOUTH DAILY 90 tablet 0  . cetirizine  (ZYRTEC ) 10 MG tablet Take 1 tablet (10 mg total) by mouth daily. 90 tablet 3  . clobetasol  (TEMOVATE ) 0.05 % external solution Apply 1 Application topically 2 (two) times daily. 50 mL 5  . cyclobenzaprine  (FLEXERIL ) 10 MG tablet Take 1 tablet (10 mg total) by mouth 3 (three) times daily as needed for muscle spasms. 90 tablet 3  . glucose blood (ACCU-CHEK GUIDE TEST) test strip Check BS up to 6 times daily Dx E10.9 200 strip 11  . insulin  aspart (NOVOLOG  FLEXPEN) 100 UNIT/ML FlexPen Inject 20 Units into the skin 3 (three) times daily with meals. 30 mL 11  . levothyroxine  (SYNTHROID ) 25 MCG tablet Take 1 tablet (25 mcg total) by mouth daily. 90 tablet 3  . lisinopril -hydrochlorothiazide  (ZESTORETIC ) 20-12.5 MG tablet TAKE ONE TABLET DAILY 90 tablet 0  . Multiple Vitamin (MULTIVITAMIN) tablet Take 1 tablet by mouth daily.    . traZODone  (DESYREL ) 150 MG tablet TAKE 1/3 UP TO 1 TABLET AT BEDTIME AS NEEDED FOR SLEEP 90 tablet 3  . TRESIBA  FLEXTOUCH 200 UNIT/ML FlexTouch Pen INJECT 80 UNITS INTO SKIN ONCE A DAY 36 mL 3  . triamcinolone  cream (KENALOG ) 0.1 % Apply topically 2 (two) times daily. 160 g 2   No current facility-administered  medications on file prior to visit.    ROS Review of Systems  Objective:  BP 133/65   Pulse 69   Temp 98.6 F (37 C)   Ht 5' 9 (1.753 m)   Wt 179 lb (81.2 kg)   SpO2 97%   BMI 26.43 kg/m   BP Readings from Last 3 Encounters:  03/10/24 133/65  12/07/23 (!) 168/64  09/14/23  (!) 142/56    Wt Readings from Last 3 Encounters:  03/10/24 179 lb (81.2 kg)  12/07/23 188 lb (85.3 kg)  09/14/23 189 lb (85.7 kg)    Lab Results  Component Value Date   HGBA1C 7.7 (H) 12/07/2023   HGBA1C 8.0 (H) 08/13/2023   HGBA1C 7.8 (H) 04/14/2023    Physical Exam  {Perform Simple Foot Exam  Perform Detailed exam:1} {Insert foot Exam (Optional):30965}   Assessment & Plan:  Hypothyroidism, unspecified type  Type 1 diabetes mellitus without complication (HCC) -     Bayer DCA Hb A1c Waived  Essential hypertension -     CMP14+EGFR  Mixed hyperlipidemia  GAD (generalized anxiety disorder)    Follow-up: No follow-ups on file.  Roise Cleaver, M.D.

## 2024-03-11 ENCOUNTER — Other Ambulatory Visit: Payer: Self-pay | Admitting: Family Medicine

## 2024-03-11 DIAGNOSIS — F411 Generalized anxiety disorder: Secondary | ICD-10-CM

## 2024-03-15 NOTE — Progress Notes (Signed)
 Hello Charles Foley,  Your lab result is normal and/or stable.Some minor variations that are not significant are commonly marked abnormal, but do not represent any medical problem for you.  Best regards, Mechele Claude, M.D.

## 2024-03-22 ENCOUNTER — Telehealth: Payer: Self-pay

## 2024-03-22 DIAGNOSIS — Z1211 Encounter for screening for malignant neoplasm of colon: Secondary | ICD-10-CM

## 2024-03-22 NOTE — Telephone Encounter (Signed)
 Who is your primary care physician: Butler stacks  Reasons for the colonoscopy: hx polyps  Have you had a colonoscopy before?  Yes 06-27-2013 Dr.David patterson  Do you have family history of colon cancer? no  Previous colonoscopy with polyps removed? yes  Do you have a history colorectal cancer?   no  Are you diabetic? If yes, Type 1 or Type 2?    Yes Type 1   Do you have a prosthetic or mechanical heart valve? no  Do you have a pacemaker/defibrillator?   no  Have you had endocarditis/atrial fibrillation? no  Have you had joint replacement within the last 12 months?  no  Do you tend to be constipated or have to use laxatives? no  Do you have any history of drugs or alchohol?  no  Do you use supplemental oxygen?  no  Have you had a stroke or heart attack within the last 6 months? no  Do you take weight loss medication?  no no  Do you take any blood-thinning medications such as: (aspirin, warfarin, Plavix, Aggrenox)  no  If yes we need the name, milligram, dosage and who is prescribing doctor  Current Outpatient Medications on File Prior to Visit  Medication Sig Dispense Refill   ALPRAZolam  (XANAX ) 0.5 MG tablet TAKE 1 AND 1/2 TABLETS AT BEDTIME AS NEEDED FOR SLEEP 30 tablet 2   Aspirin-Caffeine (BC FAST PAIN RELIEF PO) Take 1 packet by mouth daily as needed (pain).     atorvastatin  (LIPITOR) 10 MG tablet TAKE ONE TABLET BY MOUTH DAILY 90 tablet 0   cetirizine  (ZYRTEC ) 10 MG tablet Take 1 tablet (10 mg total) by mouth daily. 90 tablet 3   clobetasol  (TEMOVATE ) 0.05 % external solution Apply 1 Application topically 2 (two) times daily. 50 mL 5   cyclobenzaprine  (FLEXERIL ) 10 MG tablet Take 1 tablet (10 mg total) by mouth 3 (three) times daily as needed for muscle spasms. 90 tablet 3   glucose blood (ACCU-CHEK GUIDE TEST) test strip Check BS up to 6 times daily Dx E10.9 200 strip 11   insulin  aspart (NOVOLOG  FLEXPEN) 100 UNIT/ML FlexPen Inject 20 Units into the skin 3 (three)  times daily with meals. 30 mL 11   insulin  degludec (TRESIBA  FLEXTOUCH) 200 UNIT/ML FlexTouch Pen Inject 60 Units into the skin 2 (two) times daily. 60 mL 3   levothyroxine  (SYNTHROID ) 25 MCG tablet Take 1 tablet (25 mcg total) by mouth daily. 90 tablet 3   lisinopril -hydrochlorothiazide  (ZESTORETIC ) 20-12.5 MG tablet TAKE ONE TABLET DAILY 90 tablet 0   Multiple Vitamin (MULTIVITAMIN) tablet Take 1 tablet by mouth daily.     traZODone  (DESYREL ) 150 MG tablet TAKE 1/3 UP TO 1 TABLET AT BEDTIME AS NEEDED FOR SLEEP 90 tablet 3   triamcinolone  cream (KENALOG ) 0.1 % Apply topically 2 (two) times daily. 160 g 2   No current facility-administered medications on file prior to visit.    Allergies  Allergen Reactions   Latex Rash     Pharmacy: [facility] and [facility] [place]  Primary Insurance Name: [facility] [id number]  Best number where you can be reached: [phone]

## 2024-03-30 ENCOUNTER — Telehealth: Payer: Self-pay | Admitting: Family Medicine

## 2024-05-06 ENCOUNTER — Encounter (INDEPENDENT_AMBULATORY_CARE_PROVIDER_SITE_OTHER): Payer: Self-pay | Admitting: *Deleted

## 2024-05-06 MED ORDER — PEG 3350-KCL-NA BICARB-NACL 420 G PO SOLR
4000.0000 mL | Freq: Once | ORAL | 0 refills | Status: AC
Start: 2024-05-06 — End: 2024-05-06

## 2024-05-06 NOTE — Addendum Note (Signed)
 Addended by: JEANELL GRAEME RAMAN on: 05/06/2024 09:45 AM   Modules accepted: Orders

## 2024-05-06 NOTE — Telephone Encounter (Signed)
 Spoke with pt. Scheduled with Dr. Shaaron 9/22. Aware will be mailing instructions to him, rx for prep to be sent to pharmacy, discussed medications and extended prep he will do. Aware to call if he does not get instructions.

## 2024-05-06 NOTE — Telephone Encounter (Signed)
 Ok to schedule.  ASA 2 Needs BMET, states he is type 1 DM and on diuretic Days of prep: novolog  flexpen 1/2 dose, tresiba  30 units bid Am of tcs: hold above   Patient had very poor prep in 2014, was supposed to come back in 3 years but looks like he did not.  Do modified two day bowel prep: -day 1 miralax  prep -day 2 prep of choice -two full days of clear liquids

## 2024-05-06 NOTE — Telephone Encounter (Signed)
 Referral completed, T[facility] apt letter sent to PCP

## 2024-05-18 ENCOUNTER — Other Ambulatory Visit: Payer: Self-pay | Admitting: Family Medicine

## 2024-05-18 DIAGNOSIS — E039 Hypothyroidism, unspecified: Secondary | ICD-10-CM

## 2024-05-25 ENCOUNTER — Encounter: Payer: Self-pay | Admitting: Family Medicine

## 2024-05-25 ENCOUNTER — Ambulatory Visit: Admitting: Family Medicine

## 2024-05-25 VITALS — BP 158/56 | HR 60 | Temp 98.5°F | Ht 69.0 in | Wt 176.0 lb

## 2024-05-25 DIAGNOSIS — Z23 Encounter for immunization: Secondary | ICD-10-CM

## 2024-05-25 DIAGNOSIS — Z0000 Encounter for general adult medical examination without abnormal findings: Secondary | ICD-10-CM | POA: Diagnosis not present

## 2024-05-25 DIAGNOSIS — Z8546 Personal history of malignant neoplasm of prostate: Secondary | ICD-10-CM

## 2024-05-25 NOTE — Progress Notes (Signed)
 WELCOME TO MEDICARE (IPPE) VISIT  05/25/2024  Charles Foley DOB:05/25/1961     FMW:[id number]  I explained that today's visit was for the purpose of health promotion and disease detection, as well as an introduction to Medicare and it's covered benefits.  I explained that no labs or other services would be performed today. If labs or other services are determined to be necessary the appropriate orders/referrals will be arranged for these to be done at a future date.  Charles Foley is a 63 y.o. year old male primary care patient of Butler Der, M.D..  Discussed the use of AI scribe software for clinical note transcription with the patient, who gave verbal consent to proceed.  History of Present Illness Charles Foley is a 63 year old male who presents for a routine follow-up visit.  He manages his diabetes with two types of insulin  and no longer takes metformin . He experiences occasional hypoglycemia, which he attributes to inadequate carbohydrate intake. He regularly checks his blood sugar, noting fluctuations, with occasional high readings in the morning. Since retiring, he has noticed improved blood sugar control due to reduced stress.  He takes trazodone  to aid sleep and reports sleeping more than before. He no longer takes pantoprazole  for heartburn or reflux as it was discontinued.  He experiences urinary incontinence, which he attributes to prostate surgery, and manages it by wearing a pad. No issues with bowel control are reported.  He had a reaction to the shingles vaccine two years ago, resulting in a rash on his side, but has not had similar reactions since.  He underwent a prostatectomy over ten years ago. He is due for a PSA test as it has been over a year since his last one.  He is due for an eye exam to check for diabetic damage, as it has been a year since his last exam. He mentions having a 'blusted blood' in one eye, but it was not considered severe.   MEDICAL AND SOCIAL  HISTORY Past Medical History Past Medical History:  Diagnosis Date   Acute pain of right knee 06/17/2017   Allergic rhinitis    Anxiety    Arthritis    Cancer (HCC) 2012   prostate   Chest pain 11/02/2014   atypical, no cardiac testing ordered per Vina Gull, MD 11/03/14   Diabetes mellitus without complication (HCC)    type I   Hypertension    Hypothyroidism    Uncontrolled type 1 diabetes mellitus with hyperglycemia (HCC) 11/02/2018    Surgical History Past Surgical History:  Procedure Laterality Date   COLONOSCOPY     KNEE ARTHROSCOPY Bilateral    LUMBAR LAMINECTOMY/DECOMPRESSION MICRODISCECTOMY N/A 10/11/2020   Procedure: LUMBAR FOUR -LUMBAR FIVE DECOMPRESSION;  Surgeon: Beuford Anes, MD;  Location: [facility] OR;  Service: Orthopedics;  Laterality: N/A;  posterior   ORIF FACIAL FRACTURE  1988   PROSTATECTOMY  2012   TONSILLECTOMY     TRANSFORAMINAL LUMBAR INTERBODY FUSION W/ MIS 1 LEVEL Right 06/11/2022   Procedure: MINIMALLY INVASIVE TRANSFORAMINAL LUMBAR INTERBODY FUSION LUMBAR FOUR- LUMBAR FIVE, RIGHT;  Surgeon: Debby Dorn MATSU, MD;  Location: [facility] OR;  Service: Neurosurgery;  Laterality: Right;    Family History Family History  Problem Relation Age of Onset   Diabetes Mother    Heart disease Mother    Colon cancer Neg Hx     Social History Social History   Socioeconomic History   Marital status: Married    Spouse name: Clarita   Number of  children: 1   Years of education: Not on file   Highest education level: 10th grade  Occupational History   Not on file  Tobacco Use   Smoking status: Former    Current packs/day: 0.00    Types: Cigarettes    Quit date: 04/10/2009    Years since quitting: 15.1   Smokeless tobacco: Former    Types: Chew    Quit date: 01/10/2003   Tobacco comments:    smoke off and on  Vaping Use   Vaping status: Never Used  Substance and Sexual Activity   Alcohol use: Not Currently   Drug use: No   Sexual activity: Not Currently   Other Topics Concern   Not on file  Social History Narrative   Not on file   Social Drivers of Health   Financial Resource Strain: Medium Risk (05/25/2024)   Overall Financial Resource Strain (CARDIA)    Difficulty of Paying Living Expenses: Somewhat hard  Food Insecurity: No Food Insecurity (05/25/2024)   Hunger Vital Sign    Worried About Running Out of Food in the Last Year: Never true    Ran Out of Food in the Last Year: Never true  Transportation Needs: No Transportation Needs (05/25/2024)   PRAPARE - Administrator, Civil Service (Medical): No    Lack of Transportation (Non-Medical): No  Physical Activity: Sufficiently Active (05/25/2024)   Exercise Vital Sign    Days of Exercise per Week: 7 days    Minutes of Exercise per Session: 30 min  Stress: Stress Concern Present (05/25/2024)   [employer] of Occupational Health - Occupational Stress Questionnaire    Feeling of Stress: To some extent  Social Connections: Unknown (05/25/2024)   Social Connection and Isolation Panel    Frequency of Communication with Friends and Family: More than three times a week    Frequency of Social Gatherings with Friends and Family: More than three times a week    Attends Religious Services: Never    Database administrator or Organizations: Not on file    Attends Banker Meetings: Not on file    Marital Status: Married    Current Medications & Allergies Outpatient Encounter Medications as of 05/25/2024  Medication Sig   ALPRAZolam  (XANAX ) 0.5 MG tablet TAKE 1 AND 1/2 TABLETS AT BEDTIME AS NEEDED FOR SLEEP   Aspirin-Caffeine (BC FAST PAIN RELIEF PO) Take 1 packet by mouth daily as needed (pain).   atorvastatin  (LIPITOR) 10 MG tablet TAKE ONE TABLET BY MOUTH DAILY   cetirizine  (ZYRTEC ) 10 MG tablet Take 1 tablet (10 mg total) by mouth daily.   cyclobenzaprine  (FLEXERIL ) 10 MG tablet Take 1 tablet (10 mg total) by mouth 3 (three) times daily as needed for muscle spasms.    glucose blood (ACCU-CHEK GUIDE TEST) test strip Check BS up to 6 times daily Dx E10.9   insulin  aspart (NOVOLOG  FLEXPEN) 100 UNIT/ML FlexPen Inject 20 Units into the skin 3 (three) times daily with meals.   insulin  degludec (TRESIBA  FLEXTOUCH) 200 UNIT/ML FlexTouch Pen Inject 60 Units into the skin 2 (two) times daily.   levothyroxine  (SYNTHROID ) 25 MCG tablet TAKE ONE TABLET BY MOUTH DAILY   lisinopril -hydrochlorothiazide  (ZESTORETIC ) 20-12.5 MG tablet TAKE ONE TABLET DAILY   Multiple Vitamin (MULTIVITAMIN) tablet Take 1 tablet by mouth daily.   traZODone  (DESYREL ) 150 MG tablet TAKE 1/3 UP TO 1 TABLET AT BEDTIME AS NEEDED FOR SLEEP   [DISCONTINUED] metFORMIN  (GLUCOPHAGE -XR) 500 MG 24 hr tablet  Take 500 mg by mouth. (Patient not taking: Reported on 05/25/2024)   [DISCONTINUED] pantoprazole  (PROTONIX ) 40 MG tablet TAKE ONE TABLET BY MOUTH DAILY   No facility-administered encounter medications on file as of 05/25/2024.   Latex  Diet & Physical Activity     Pt consumes 2 meals a day and 1 snacks a day.  The patient feels that he mostly follow a Regular diet.   DEPRESSION SCREENING    05/25/2024   10:16 AM 03/10/2024    8:43 AM 09/11/2023    9:46 AM 08/13/2023   10:38 AM 04/14/2023    8:03 AM 10/14/2022    8:24 AM 04/24/2022    9:36 AM  PHQ 2/9 Scores  PHQ - 2 Score 0 0 0 0 0 0 0  PHQ- 9 Score  0          FUNCTIONAL ABILITY & LEVEL OF SAFETY Fall Risk    05/25/2024   10:18 AM 05/23/2024    1:47 PM 03/10/2024    8:42 AM 08/13/2023   10:38 AM 04/14/2023    8:03 AM  Fall Risk   Falls in the past year? 0 0 0 0 0  Number falls in past yr: 0 0 0    Injury with Fall? 0 0 0    Risk for fall due to : No Fall Risks  No Fall Risks    Follow up Falls evaluation completed  Falls evaluation completed      Activities of Daily Living    05/25/2024   10:08 AM 05/23/2024    1:47 PM  In your present state of health, do you have any difficulty performing the following activities:  Hearing? 0 0   Vision? 0 0  Difficulty concentrating or making decisions? 0 0  Walking or climbing stairs? 0 0  Dressing or bathing? 0 0  Doing errands, shopping? 0 0  Preparing Food and eating ? N N  Using the Toilet? N N  In the past six months, have you accidently leaked urine? Y Y  Do you have problems with loss of bowel control? N N  Managing your Medications? N N  Managing your Finances? N N  Housekeeping or managing your Housekeeping? N N    Cognitive Function    05/25/2024   10:19 AM  MMSE - Mini Mental State Exam  Orientation to time 5  Orientation to Place 5  Registration 3  Attention/ Calculation 5  Recall 2  Language- name 2 objects 2  Language- repeat 1  Language- follow 3 step command 3  Language- read & follow direction 1  Write a sentence 1  Copy design 1  Total score 29       Normal Cognitive Function Screening today: Yes   EXAM (physical exam is not indicated for this visit type) Today's Vitals   05/25/24 1002  BP: (!) 158/56  Pulse: 60  Temp: 98.5 F (36.9 C)  SpO2: 99%  Weight: 176 lb (79.8 kg)  Height: 5' 9 (1.753 m)   Body mass index is 25.99 kg/m.  Visual Acuity Screen: No results found. Pt gets routine eye care through optometrist/ophthalmologist and is not  up to date with follow up care with this provider No additional physical exam required or indicated today.  Health Maintenance Health Maintenance  Topic Date Due   Colonoscopy  06/27/2016   FOOT EXAM  02/12/2022   OPHTHALMOLOGY EXAM  04/13/2024   COVID-19 Vaccine (4 - 2025-26 season) 06/10/2024 (Originally 05/23/2024)  Pneumococcal Vaccine: 50+ Years (2 of 2 - PCV) 12/06/2024 (Originally 05/02/2021)   HEMOGLOBIN A1C  09/09/2024   Diabetic kidney evaluation - Urine ACR  12/06/2024   Diabetic kidney evaluation - eGFR measurement  03/10/2025   Medicare Annual Wellness (AWV)  05/25/2025   DTaP/Tdap/Td (4 - Td or Tdap) 07/07/2029   INFLUENZA VACCINE  Completed   Hepatitis C Screening   Completed   HIV Screening  Completed   Hepatitis B Vaccines 19-59 Average Risk  Aged Out   HPV VACCINES  Aged Out   Meningococcal B Vaccine  Aged Out   Zoster Vaccines- Shingrix   Discontinued     END OF LIFE PLANNING Advanced directives and power of attorney information specific to the patient were discussed.       05/25/2024   10:18 AM 09/04/2023    2:47 PM 06/11/2022   10:37 AM 06/06/2022    8:26 AM 04/22/2022    6:38 PM 03/09/2022    4:35 PM 10/09/2020    8:38 AM  Advanced Directives  Does Patient Have a Medical Advance Directive? No No No No Yes No No  Would patient like information on creating a medical advance directive? No - Patient declined  No - Patient declined Yes (MAU/Ambulatory/Procedural Areas - Information given)   Yes (MAU/Ambulatory/Procedural Areas - Information given)     Education, counseling, and referral for other preventive services: Written checklist was completed and given to pt for obtaining, as appropriate, the other preventive services that are covered as separate Medicare Part B benefits. Possible services that were reviewed with pt are:  -annual wellness visit (AWV) -Bone mass measurements -Cardiovascular screening blood tests -Colorectal cancer screening -Counseling to prevent tobacco use -Diabetes screening tests -Diabetes self-management training (DSMT) -Glaucoma screening -HIV screening -Medical nutrition therapy -Prostate cancer screening -Seasonal influenza, pneumococcal, and Hep B vaccines -screening mammography -screening pap tests and pelvic exam -ultrasound screening for AAA  Of the above listed services, no referrals were made today.  Patient did not have an additional complaint/problem that was discussed and evaluated today.  Patient was given opportunity to ask any additional questions regarding Medicare and covered benefits.  Patient was informed that Medicare does not provide coverage for routine physical exams.  I answered all  questions to the best of my ability today.   Assessment and Plan Assessment & Plan Type 2 diabetes mellitus with insulin  therapy and diabetic retinopathy   Type 2 diabetes mellitus is managed with insulin  therapy. He reports occasional hypoglycemia, likely due to low carbohydrate intake. Diabetic retinopathy is present. Stress reduction and improved sleep have positively impacted glucose control. Administer flu vaccine today. Adjust insulin  dose to half if meal is low in carbohydrates. Schedule follow-up for diabetes check around June 11, 2024. Encourage regular eye exams to monitor diabetic retinopathy.  Urinary incontinence following prostatectomy   Urinary incontinence persists post-prostatectomy, managed with pads. Symptoms include leakage with sneezing or straining.  Prostate cancer, post-prostatectomy   Prostate cancer with prostatectomy performed. No current follow-up with prostate specialist. PSA not checked in over a year. Order PSA test today.     Follow up:  3mos  Butler Der, M.D.

## 2024-05-26 LAB — PSA, TOTAL AND FREE
PSA, Free: <0.02 ng/mL
Prostate Specific Ag, Serum: <0.1 ng/mL (ref 0.0–4.0)

## 2024-05-29 ENCOUNTER — Ambulatory Visit: Payer: Self-pay | Admitting: Family Medicine

## 2024-05-29 NOTE — Progress Notes (Signed)
 Hello Adryel,    Your PSA test for prostate cancer came back completely normal.  I recommend repeating it yearly.  Best regards, Butler Der, M.D.

## 2024-06-03 ENCOUNTER — Other Ambulatory Visit: Payer: Self-pay | Admitting: Family Medicine

## 2024-06-03 DIAGNOSIS — E109 Type 1 diabetes mellitus without complications: Secondary | ICD-10-CM

## 2024-06-06 ENCOUNTER — Other Ambulatory Visit (HOSPITAL_COMMUNITY)
Admission: RE | Admit: 2024-06-06 | Discharge: 2024-06-06 | Disposition: A | Discharge status: Home / Self Care | Source: Ambulatory Visit | Attending: Internal Medicine | Admitting: Internal Medicine

## 2024-06-06 DIAGNOSIS — Z1211 Encounter for screening for malignant neoplasm of colon: Secondary | ICD-10-CM | POA: Diagnosis present

## 2024-06-06 LAB — BASIC METABOLIC PANEL WITH GFR
Anion gap: 9 (ref 5–15)
BUN: 24 mg/dL — ABNORMAL HIGH (ref 8–23)
CO2: 25 mmol/L (ref 22–32)
Calcium: 9.2 mg/dL (ref 8.9–10.3)
Chloride: 101 mmol/L (ref 98–111)
Creatinine, Ser: 0.92 mg/dL (ref 0.61–1.24)
GFR, Estimated: >60 mL/min (ref >60)
Glucose, Bld: 297 mg/dL — ABNORMAL HIGH (ref 70–99)
Potassium: 4.4 mmol/L (ref 3.5–5.1)
Sodium: 135 mmol/L (ref 135–145)

## 2024-06-13 ENCOUNTER — Ambulatory Visit (HOSPITAL_COMMUNITY)
Admission: RE | Admit: 2024-06-13 | Discharge: 2024-06-13 | Disposition: A | Discharge status: Home / Self Care | Attending: Internal Medicine | Admitting: Internal Medicine

## 2024-06-13 ENCOUNTER — Encounter (HOSPITAL_COMMUNITY): Payer: Self-pay | Admitting: Internal Medicine

## 2024-06-13 ENCOUNTER — Other Ambulatory Visit: Payer: Self-pay

## 2024-06-13 ENCOUNTER — Ambulatory Visit (HOSPITAL_BASED_OUTPATIENT_CLINIC_OR_DEPARTMENT_OTHER): Admitting: Anesthesiology

## 2024-06-13 ENCOUNTER — Encounter (HOSPITAL_COMMUNITY)
Admission: RE | Disposition: A | Discharge status: Home / Self Care | Payer: Self-pay | Source: Home / Self Care | Attending: Internal Medicine

## 2024-06-13 ENCOUNTER — Ambulatory Visit (HOSPITAL_COMMUNITY): Admitting: Anesthesiology

## 2024-06-13 DIAGNOSIS — K635 Polyp of colon: Secondary | ICD-10-CM

## 2024-06-13 DIAGNOSIS — Z87891 Personal history of nicotine dependence: Secondary | ICD-10-CM | POA: Insufficient documentation

## 2024-06-13 DIAGNOSIS — Z1211 Encounter for screening for malignant neoplasm of colon: Secondary | ICD-10-CM | POA: Insufficient documentation

## 2024-06-13 DIAGNOSIS — I10 Essential (primary) hypertension: Secondary | ICD-10-CM | POA: Insufficient documentation

## 2024-06-13 DIAGNOSIS — Z860100 Personal history of colon polyps, unspecified: Secondary | ICD-10-CM

## 2024-06-13 DIAGNOSIS — F419 Anxiety disorder, unspecified: Secondary | ICD-10-CM | POA: Diagnosis not present

## 2024-06-13 DIAGNOSIS — E109 Type 1 diabetes mellitus without complications: Secondary | ICD-10-CM | POA: Insufficient documentation

## 2024-06-13 DIAGNOSIS — E039 Hypothyroidism, unspecified: Secondary | ICD-10-CM | POA: Insufficient documentation

## 2024-06-13 DIAGNOSIS — Z794 Long term (current) use of insulin: Secondary | ICD-10-CM | POA: Diagnosis not present

## 2024-06-13 DIAGNOSIS — E119 Type 2 diabetes mellitus without complications: Secondary | ICD-10-CM | POA: Diagnosis not present

## 2024-06-13 DIAGNOSIS — D122 Benign neoplasm of ascending colon: Secondary | ICD-10-CM

## 2024-06-13 DIAGNOSIS — Z139 Encounter for screening, unspecified: Secondary | ICD-10-CM | POA: Diagnosis not present

## 2024-06-13 DIAGNOSIS — K6389 Other specified diseases of intestine: Secondary | ICD-10-CM | POA: Diagnosis not present

## 2024-06-13 LAB — GLUCOSE, CAPILLARY: Glucose-Capillary: 98 mg/dL (ref 70–99)

## 2024-06-13 SURGERY — COLONOSCOPY
Anesthesia: General

## 2024-06-13 MED ORDER — PHENYLEPHRINE 80 MCG/ML (10ML) SYRINGE FOR IV PUSH (FOR BLOOD PRESSURE SUPPORT)
PREFILLED_SYRINGE | INTRAVENOUS | Status: DC | PRN
Start: 2024-06-13 — End: 2024-06-13
  Administered 2024-06-13 (×3): 80 ug via INTRAVENOUS

## 2024-06-13 MED ORDER — PROPOFOL 10 MG/ML IV BOLUS
INTRAVENOUS | Status: DC | PRN
Start: 2024-06-13 — End: 2024-06-13
  Administered 2024-06-13: 80 mg via INTRAVENOUS
  Administered 2024-06-13: 125 ug/kg/min via INTRAVENOUS

## 2024-06-13 MED ORDER — LIDOCAINE 2% (20 MG/ML) 5 ML SYRINGE
INTRAMUSCULAR | Status: DC | PRN
  Administered 2024-06-13: 60 mg via INTRAVENOUS

## 2024-06-13 MED ORDER — EPHEDRINE SULFATE-NACL 50-0.9 MG/10ML-% IV SOSY
PREFILLED_SYRINGE | INTRAVENOUS | Status: DC | PRN
Start: 2024-06-13 — End: 2024-06-13
  Administered 2024-06-13 (×2): 10 mg via INTRAVENOUS

## 2024-06-13 MED ORDER — LACTATED RINGERS IV SOLN: INTRAVENOUS | Status: DC | PRN

## 2024-06-13 NOTE — H&P (Signed)
 @LOGO @   Gastroenterology Progress Note    Primary Care Physician:  Zollie Lowers, MD Primary Gastroenterologist:  Dr. Shaaron  Pre-Procedure History & Physical: HPI:  Charles Foley is a 63 y.o. male here for screening colonoscopy.  Reported history of polyps poor prep on 2014 colonoscopy elsewhere.  Was instructed to come back in 3 years for follow-up.  He did not.  Past Medical History:  Diagnosis Date   Acute pain of right knee 06/17/2017   Allergic rhinitis    Anxiety    Arthritis    Cancer (HCC) 2012   prostate   Chest pain 11/02/2014   atypical, no cardiac testing ordered per Vina Gull, MD 11/03/14   Diabetes mellitus without complication (HCC)    type I   Hypertension    Hypothyroidism    Uncontrolled type 1 diabetes mellitus with hyperglycemia (HCC) 11/02/2018    Past Surgical History:  Procedure Laterality Date   COLONOSCOPY     KNEE ARTHROSCOPY Bilateral    LUMBAR LAMINECTOMY/DECOMPRESSION MICRODISCECTOMY N/A 10/11/2020   Procedure: LUMBAR FOUR -LUMBAR FIVE DECOMPRESSION;  Surgeon: Beuford Anes, MD;  Location: [facility] OR;  Service: Orthopedics;  Laterality: N/A;  posterior   ORIF FACIAL FRACTURE  1988   PROSTATECTOMY  2012   TONSILLECTOMY     TRANSFORAMINAL LUMBAR INTERBODY FUSION W/ MIS 1 LEVEL Right 06/11/2022   Procedure: MINIMALLY INVASIVE TRANSFORAMINAL LUMBAR INTERBODY FUSION LUMBAR FOUR- LUMBAR FIVE, RIGHT;  Surgeon: Debby Dorn MATSU, MD;  Location: [facility] OR;  Service: Neurosurgery;  Laterality: Right;    Prior to Admission medications   Medication Sig Start Date End Date Taking? Authorizing Provider  ALPRAZolam  (XANAX ) 0.5 MG tablet TAKE 1 AND 1/2 TABLETS AT BEDTIME AS NEEDED FOR SLEEP 03/15/24  Yes Stacks, Lowers, MD  atorvastatin  (LIPITOR) 10 MG tablet TAKE ONE TABLET BY MOUTH DAILY 06/03/24  Yes Zollie Lowers, MD  cetirizine  (ZYRTEC ) 10 MG tablet Take 1 tablet (10 mg total) by mouth daily. 01/31/20  Yes Stacks, Lowers, MD  lisinopril -hydrochlorothiazide   (ZESTORETIC ) 20-12.5 MG tablet TAKE ONE TABLET DAILY 06/03/24  Yes Stacks, Lowers, MD  Aspirin-Caffeine (BC FAST PAIN RELIEF PO) Take 1 packet by mouth daily as needed (pain).    [provider]  cyclobenzaprine  (FLEXERIL ) 10 MG tablet Take 1 tablet (10 mg total) by mouth 3 (three) times daily as needed for muscle spasms. 12/07/23   Zollie Lowers, MD  glucose blood (ACCU-CHEK GUIDE TEST) test strip Check BS up to 6 times daily Dx E10.9 11/13/23   Zollie Lowers, MD  insulin  aspart (NOVOLOG  FLEXPEN) 100 UNIT/ML FlexPen Inject 20 Units into the skin 3 (three) times daily with meals. 08/24/23   Zollie Lowers, MD  insulin  degludec (TRESIBA  FLEXTOUCH) 200 UNIT/ML FlexTouch Pen Inject 60 Units into the skin 2 (two) times daily. 03/10/24   Zollie Lowers, MD  levothyroxine  (SYNTHROID ) 25 MCG tablet TAKE ONE TABLET BY MOUTH DAILY 05/19/24   Zollie Lowers, MD  Multiple Vitamin (MULTIVITAMIN) tablet Take 1 tablet by mouth daily.    [provider]  traZODone  (DESYREL ) 150 MG tablet TAKE 1/3 UP TO 1 TABLET AT BEDTIME AS NEEDED FOR SLEEP 11/12/23   Zollie Lowers, MD    Allergies as of 05/06/2024 - Review Complete 03/22/2024  Allergen Reaction Noted   Latex Rash 03/09/2022    Family History  Problem Relation Age of Onset   Diabetes Mother    Heart disease Mother    Colon cancer Neg Hx     Social History   Socioeconomic History  Marital status: Married    Spouse name: Clarita   Number of children: 1   Years of education: Not on file   Highest education level: 10th grade  Occupational History   Not on file  Tobacco Use   Smoking status: Former    Current packs/day: 0.00    Types: Cigarettes    Quit date: 04/10/2009    Years since quitting: 15.1   Smokeless tobacco: Former    Types: Chew    Quit date: 01/10/2003   Tobacco comments:    smoke off and on  Vaping Use   Vaping status: Never Used  Substance and Sexual Activity   Alcohol use: Not Currently   Drug use: No    Sexual activity: Not Currently  Other Topics Concern   Not on file  Social History Narrative   Not on file   Social Drivers of Health   Financial Resource Strain: Medium Risk (05/25/2024)   Overall Financial Resource Strain (CARDIA)    Difficulty of Paying Living Expenses: Somewhat hard  Food Insecurity: No Food Insecurity (05/25/2024)   Hunger Vital Sign    Worried About Running Out of Food in the Last Year: Never true    Ran Out of Food in the Last Year: Never true  Transportation Needs: No Transportation Needs (05/25/2024)   PRAPARE - Administrator, Civil Service (Medical): No    Lack of Transportation (Non-Medical): No  Physical Activity: Sufficiently Active (05/25/2024)   Exercise Vital Sign    Days of Exercise per Week: 7 days    Minutes of Exercise per Session: 30 min  Stress: Stress Concern Present (05/25/2024)   [employer] of Occupational Health - Occupational Stress Questionnaire    Feeling of Stress: To some extent  Social Connections: Unknown (05/25/2024)   Social Connection and Isolation Panel    Frequency of Communication with Friends and Family: More than three times a week    Frequency of Social Gatherings with Friends and Family: More than three times a week    Attends Religious Services: Never    Database administrator or Organizations: Not on file    Attends Banker Meetings: Not on file    Marital Status: Married  Intimate Partner Violence: Not At Risk (05/25/2024)   Humiliation, Afraid, Rape, and Kick questionnaire    Fear of Current or Ex-Partner: No    Emotionally Abused: No    Physically Abused: No    Sexually Abused: No    Review of Systems   See HPI, otherwise negative ROS  Physical Exam: BP (!) 161/59   Pulse 68   Temp (!) 97.5 F (36.4 C) (Oral)   Resp 17   Ht 5' 9 (1.753 m)   Wt 77.1 kg   SpO2 98%   BMI 25.10 kg/m  General:   Alert,  Well-developed, well-nourished, pleasant and cooperative in NAD Neck:  Supple;  no masses or thyromegaly. No significant cervical adenopathy. Lungs:  Clear throughout to auscultation.   No wheezes, crackles, or rhonchi. No acute distress. Heart:  Regular rate and rhythm; no murmurs, clicks, rubs,  or gallops. Abdomen: Non-distended, normal bowel sounds.  Soft and nontender without appreciable mass or hepatosplenomegaly.    Impression/Plan:   63 year old gentleman here for a screening colonoscopy. The risks, benefits, limitations, alternatives and imponderables have been reviewed with the patient. Questions have been answered. All parties are agreeable.      Notice: This dictation was prepared with Dragon dictation along  with smaller phrase technology. Any transcriptional errors that result from this process are unintentional and may not be corrected upon review.

## 2024-06-13 NOTE — Discharge Instructions (Addendum)
  Colonoscopy Discharge Instructions  Read the instructions outlined below and refer to this sheet in the next few weeks. These discharge instructions provide you with general information on caring for yourself after you leave the hospital. Your doctor may also give you specific instructions. While your treatment has been planned according to the most current medical practices available, unavoidable complications occasionally occur. If you have any problems or questions after discharge, call Dr. Riley Cheadle at [phone]. ACTIVITY You may resume your regular activity, but move at a slower pace for the next 24 hours.  Take frequent rest periods for the next 24 hours.  Walking will help get rid of the air and reduce the bloated feeling in your belly (abdomen).  No driving for 24 hours (because of the medicine (anesthesia) used during the test).   Do not sign any important legal documents or operate any machinery for 24 hours (because of the anesthesia used during the test).  NUTRITION Drink plenty of fluids.  You may resume your normal diet as instructed by your doctor.  Begin with a light meal and progress to your normal diet. Heavy or fried foods are harder to digest and may make you feel sick to your stomach (nauseated).  Avoid alcoholic beverages for 24 hours or as instructed.  MEDICATIONS You may resume your normal medications unless your doctor tells you otherwise.  WHAT YOU CAN EXPECT TODAY Some feelings of bloating in the abdomen.  Passage of more gas than usual.  Spotting of blood in your stool or on the toilet paper.  IF YOU HAD POLYPS REMOVED DURING THE COLONOSCOPY: No aspirin products for 7 days or as instructed.  No alcohol for 7 days or as instructed.  Eat a soft diet for the next 24 hours.  FINDING OUT THE RESULTS OF YOUR TEST Not all test results are available during your visit. If your test results are not back during the visit, make an appointment with your caregiver to find out the  results. Do not assume everything is normal if you have not heard from your caregiver or the medical facility. It is important for you to follow up on all of your test results.  SEEK IMMEDIATE MEDICAL ATTENTION IF: You have more than a spotting of blood in your stool.  Your belly is swollen (abdominal distention).  You are nauseated or vomiting.  You have a temperature over 101.  You have abdominal pain or discomfort that is severe or gets worse throughout the day.      1 polyp found and removed today  Further recommendations to follow pending review of pathology report

## 2024-06-13 NOTE — Op Note (Signed)
 [facility] Patient Name: Charles Foley Procedure Date: 06/13/2024 8:28 AM MRN: [id number] Date of Birth: 1961/06/22 Attending MD: Lamar Ozell Hollingshead , MD, [id number] CSN: [id number] Age: 63 Admit Type: Outpatient Procedure:                Colonoscopy Indications:              Screening for colorectal malignant neoplasm Providers:                Lamar Ozell Hollingshead, MD, Jon LABOR. Gerome RN, RN,                            Bascom Blush Referring MD:              Medicines:                Propofol  per Anesthesia Complications:            No immediate complications. Estimated Blood Loss:     Estimated blood loss was minimal. Procedure:                Pre-Anesthesia Assessment:                           - Prior to the procedure, a History and Physical                            was performed, and patient medications and                            allergies were reviewed. The patient's tolerance of                            previous anesthesia was also reviewed. The risks                            and benefits of the procedure and the sedation                            options and risks were discussed with the patient.                            All questions were answered, and informed consent                            was obtained. Prior Anticoagulants: The patient has                            taken no anticoagulant or antiplatelet agents. ASA                            Grade Assessment: III - A patient with severe                            systemic disease. After reviewing the risks and  benefits, the patient was deemed in satisfactory                            condition to undergo the procedure.                           After obtaining informed consent, the colonoscope                            was passed under direct vision. Throughout the                            procedure, the patient's blood pressure, pulse, and                             oxygen saturations were monitored continuously. The                            [id number] ([id number]) Colon was introduced through                            the anus and advanced to the the cecum, identified                            by appendiceal orifice and ileocecal valve. The                            colonoscopy was performed without difficulty. The                            patient tolerated the procedure well. The quality                            of the bowel preparation was adequate. The                            ileocecal valve, appendiceal orifice, and rectum                            were photographed. Scope In: 8:46:32 AM Scope Out: 8:59:42 AM Scope Withdrawal Time: 0 hours 6 minutes 22 seconds  Total Procedure Duration: 0 hours 13 minutes 10 seconds  Findings:      The perianal and digital rectal examinations were normal.      A 5 mm polyp was found in the ascending colon. The polyp was sessile.       The polyp was removed with a cold snare. Resection and retrieval were       complete. Estimated blood loss was minimal.      The exam was otherwise without abnormality on direct and retroflexion       views. Impression:               - One 5 mm polyp in the ascending colon, removed  with a cold snare. Resected and retrieved.                           - The examination was otherwise normal on direct                            and retroflexion views. Moderate Sedation:      Moderate (conscious) sedation was personally administered by an       anesthesia professional. The following parameters were monitored: oxygen       saturation, heart rate, blood pressure, respiratory rate, EKG, adequacy       of pulmonary ventilation, and response to care. Recommendation:           - Patient has a contact number available for                            emergencies. The signs and symptoms of potential                            delayed complications were  discussed with the                            patient. Return to normal activities tomorrow.                            Written discharge instructions were provided to the                            patient.                           - Advance diet as tolerated.                           - Continue present medications.                           - Repeat colonoscopy after studies are complete for                            surveillance.                           - Return to GI office (date not yet determined). Procedure Code(s):        --- Professional ---                           [phone], Colonoscopy, flexible; with removal of                            tumor(s), polyp(s), or other lesion(s) by snare                            technique Diagnosis Code(s):        --- Professional ---  Z12.11, Encounter for screening for malignant                            neoplasm of colon                           D12.2, Benign neoplasm of ascending colon CPT copyright 2022 American Medical Association. All rights reserved. The codes documented in this report are preliminary and upon coder review may  be revised to meet current compliance requirements. Lamar HERO. Bethanie Bloxom, MD Lamar Ozell Hollingshead, MD 06/13/2024 9:05:05 AM This report has been signed electronically. Number of Addenda: 0

## 2024-06-13 NOTE — Transfer of Care (Addendum)
 Immediate Anesthesia Transfer of Care Note  Patient: Charles Foley  Procedure(s) Performed: COLONOSCOPY  Patient Location: Short Stay  Anesthesia Type:General  Level of Consciousness: drowsy and patient cooperative  Airway & Oxygen Therapy: Patient Spontanous Breathing  Post-op Assessment: Report given to RN and Post -op Vital signs reviewed and stable  Post vital signs: Reviewed and stable  Last Vitals:  Vitals Value Taken Time  BP 118/45 06/13/24   0904  Temp 36.8 0922/25    0904  Pulse 62 06/13/24   0904  Resp 18 06/13/24   0904  SpO2 98% 06/13/24   0904    Last Pain:  Vitals:   06/13/24 0841  TempSrc:   PainSc: 2       Patients Stated Pain Goal: 8 (06/13/24 0743)  Complications: No notable events documented.

## 2024-06-13 NOTE — Anesthesia Postprocedure Evaluation (Signed)
 Anesthesia Post Note  Patient: Agamjot Kilgallon  Procedure(s) Performed: COLONOSCOPY  Patient location during evaluation: Phase II Anesthesia Type: General Level of consciousness: awake Pain management: pain level controlled Vital Signs Assessment: post-procedure vital signs reviewed and stable Respiratory status: spontaneous breathing and respiratory function stable Cardiovascular status: blood pressure returned to baseline and stable Postop Assessment: no headache and no apparent nausea or vomiting Anesthetic complications: no Comments: Late entry   No notable events documented.   Last Vitals:  Vitals:   06/13/24 0906 06/13/24 0909  BP: (!) 115/48 (!) 124/55  Pulse:    Resp:    Temp:    SpO2:      Last Pain:  Vitals:   06/13/24 0904  TempSrc: Oral  PainSc: 0-No pain                 Yvonna JINNY Bosworth

## 2024-06-13 NOTE — Anesthesia Procedure Notes (Signed)
 Date/Time: 06/13/2024 8:42 AM  Performed by: Para Jerelene CROME, CRNAOxygen Delivery Method: Nasal cannula

## 2024-06-13 NOTE — Anesthesia Preprocedure Evaluation (Signed)
 Anesthesia Evaluation  Patient identified by MRN, date of birth, ID band Patient awake    Reviewed: Allergy & Precautions, H&P , NPO status , Patient's Chart, lab work & pertinent test results, reviewed documented beta blocker date and time   Airway Mallampati: II  TM Distance: >3 FB Neck ROM: full    Dental no notable dental hx.    Pulmonary neg pulmonary ROS, former smoker   Pulmonary exam normal breath sounds clear to auscultation       Cardiovascular Exercise Tolerance: Good hypertension,  Rhythm:regular Rate:Normal     Neuro/Psych  PSYCHIATRIC DISORDERS Anxiety      Neuromuscular disease    GI/Hepatic negative GI ROS, Neg liver ROS,,,  Endo/Other  diabetesHypothyroidism    Renal/GU negative Renal ROS  negative genitourinary   Musculoskeletal   Abdominal   Peds  Hematology negative hematology ROS (+)   Anesthesia Other Findings   Reproductive/Obstetrics negative OB ROS                              Anesthesia Physical Anesthesia Plan  ASA: 3  Anesthesia Plan: General   Post-op Pain Management:    Induction:   PONV Risk Score and Plan: Propofol  infusion  Airway Management Planned:   Additional Equipment:   Intra-op Plan:   Post-operative Plan:   Informed Consent: I have reviewed the patients History and Physical, chart, labs and discussed the procedure including the risks, benefits and alternatives for the proposed anesthesia with the patient or authorized representative who has indicated his/her understanding and acceptance.     Dental Advisory Given  Plan Discussed with: CRNA  Anesthesia Plan Comments:         Anesthesia Quick Evaluation

## 2024-06-14 ENCOUNTER — Encounter (HOSPITAL_COMMUNITY): Payer: Self-pay | Admitting: Internal Medicine

## 2024-06-14 LAB — SURGICAL PATHOLOGY

## 2024-06-16 ENCOUNTER — Ambulatory Visit (INDEPENDENT_AMBULATORY_CARE_PROVIDER_SITE_OTHER): Admitting: Family Medicine

## 2024-06-16 ENCOUNTER — Encounter: Payer: Self-pay | Admitting: Family Medicine

## 2024-06-16 ENCOUNTER — Ambulatory Visit: Payer: Self-pay | Admitting: Internal Medicine

## 2024-06-16 VITALS — BP 147/59 | HR 60 | Temp 98.2°F | Ht 69.0 in | Wt 176.0 lb

## 2024-06-16 DIAGNOSIS — E782 Mixed hyperlipidemia: Secondary | ICD-10-CM | POA: Diagnosis not present

## 2024-06-16 DIAGNOSIS — E109 Type 1 diabetes mellitus without complications: Secondary | ICD-10-CM

## 2024-06-16 DIAGNOSIS — I10 Essential (primary) hypertension: Secondary | ICD-10-CM | POA: Diagnosis not present

## 2024-06-16 DIAGNOSIS — E039 Hypothyroidism, unspecified: Secondary | ICD-10-CM

## 2024-06-16 DIAGNOSIS — M150 Primary generalized (osteo)arthritis: Secondary | ICD-10-CM | POA: Diagnosis not present

## 2024-06-16 DIAGNOSIS — F411 Generalized anxiety disorder: Secondary | ICD-10-CM

## 2024-06-16 LAB — LIPID PANEL

## 2024-06-16 LAB — BAYER DCA HB A1C WAIVED: HB A1C (BAYER DCA - WAIVED): 6.8 % — ABNORMAL HIGH (ref 4.8–5.6)

## 2024-06-16 MED ORDER — MELOXICAM 7.5 MG PO TABS: 7.5000 mg | ORAL_TABLET | Freq: Every day | ORAL | 1 refills | Status: DC

## 2024-06-16 MED ORDER — HUMALOG KWIKPEN 100 UNIT/ML ~~LOC~~ SOPN: 10.0000 [IU] | PEN_INJECTOR | Freq: Three times a day (TID) | SUBCUTANEOUS | 11 refills | Status: DC

## 2024-06-16 MED ORDER — ALPRAZOLAM 0.5 MG PO TABS: 0.5000 mg | ORAL_TABLET | Freq: Every evening | ORAL | 2 refills | Status: DC | PRN

## 2024-06-16 NOTE — Progress Notes (Signed)
 Subjective:  Patient ID: Charles Foley, male    DOB: 03/17/1961  Age: 63 y.o. MRN: [id number]  CC: Medical Management of Chronic Issues   HPI  Discussed the use of AI scribe software for clinical note transcription with the patient, who gave verbal consent to proceed.  History of Present Illness Charles Foley is a 63 year old male with diabetes and thyroid  issues who presents for a routine checkup.  Blood sugar management has been challenging, especially during his recent colonoscopy preparation, which required fasting. He has been adjusting his insulin  doses, particularly reducing his nighttime dose due to low blood sugar levels. His current A1c is 6.8. He uses Tresiba  at 60 units BID and Novolog  10 u AC, TID, which he plans to switch to Humalog  due to insurance preferences. He prefers vials over pens for insulin  administration but currently uses pens. He has experienced bruising with injections in the past.  He recently underwent a colonoscopy three days ago, where a small polyp was found and removed. He is awaiting pathology results, which are expected in one to two weeks. His last colonoscopy was in 2014, where a polyp was noted but not removed.  He experiences knee and arm pain, which has worsened, affecting his ability to climb stairs. He previously used Mobic  (meloxicam ) for relief, which he found effective. He is not currently on it but wants to resume it.  He takes lisinopril  HCT for blood pressure management.  He also takes alprazolam  for sleep, using one and a half tablets at bedtime, and reports sleeping well.  No chest pain, shortness of breath, cough, swelling, or abdominal pain. He mentions back pain that limits his activity, but he remains as active as possible.          05/25/2024   10:16 AM 03/10/2024    8:43 AM 09/11/2023    9:46 AM  Depression screen PHQ 2/9  Decreased Interest 0 0 0  Down, Depressed, Hopeless 0 0 0  PHQ - 2 Score 0 0 0  Altered sleeping  0    Tired, decreased energy  0   Change in appetite  0   Feeling bad or failure about yourself   0   Trouble concentrating  0   Moving slowly or fidgety/restless  0   Suicidal thoughts  0   PHQ-9 Score  0   Difficult doing work/chores  Not difficult at all     History Charles Foley has a past medical history of Acute pain of right knee (06/17/2017), Allergic rhinitis, Anxiety, Arthritis, Cancer (HCC) (2012), Chest pain (11/02/2014), Diabetes mellitus without complication (HCC), Hypertension, Hypothyroidism, and Uncontrolled type 1 diabetes mellitus with hyperglycemia (HCC) (11/02/2018).   He has a past surgical history that includes Prostatectomy (2012); ORIF facial fracture (1988); Knee arthroscopy (Bilateral); Colonoscopy; Lumbar laminectomy/decompression microdiscectomy (N/A, 10/11/2020); Tonsillectomy; Transforaminal lumbar interbody fusion w/ mis 1 level (Right, 06/11/2022); and Colonoscopy (N/A, 06/13/2024).   His family history includes Diabetes in his mother; Heart disease in his mother.He reports that he quit smoking about 15 years ago. His smoking use included cigarettes. He quit smokeless tobacco use about 21 years ago.  His smokeless tobacco use included chew. He reports that he does not currently use alcohol. He reports that he does not use drugs.    ROS Review of Systems  Constitutional: Negative.   HENT: Negative.    Eyes:  Negative for visual disturbance.  Respiratory:  Negative for cough and shortness of breath.   Cardiovascular:  Negative  for chest pain and leg swelling.  Gastrointestinal:  Negative for abdominal pain, diarrhea, nausea and vomiting.  Genitourinary:  Negative for difficulty urinating.  Musculoskeletal:  Negative for arthralgias and myalgias.  Skin:  Negative for rash.  Neurological:  Negative for headaches.  Psychiatric/Behavioral:  Negative for sleep disturbance.     Objective:  BP (!) 147/59   Pulse 60   Temp 98.2 F (36.8 C)   Ht 5' 9 (1.753 m)   Wt  176 lb (79.8 kg)   SpO2 97%   BMI 25.99 kg/m   BP Readings from Last 3 Encounters:  06/16/24 (!) 147/59  06/13/24 (!) 124/55  05/25/24 (!) 158/56    Wt Readings from Last 3 Encounters:  06/16/24 176 lb (79.8 kg)  06/13/24 170 lb (77.1 kg)  05/25/24 176 lb (79.8 kg)     Physical Exam Physical Exam GENERAL: Alert, cooperative, well developed, no acute distress HEENT: Normocephalic, normal oropharynx, moist mucous membranes CHEST: Clear to auscultation bilaterally, No wheezes, rhonchi, or crackles CARDIOVASCULAR: Normal heart rate and rhythm, S1 and S2 normal without murmurs ABDOMEN: Soft, non-tender, non-distended, without organomegaly, Normal bowel sounds EXTREMITIES: No cyanosis or edema NEUROLOGICAL: Cranial nerves grossly intact, Moves all extremities without gross motor or sensory deficit   Assessment & Plan:  Type 1 diabetes mellitus without complication -     CMP14+EGFR -     Bayer DCA Hb A1c Waived  Hypothyroidism, unspecified type -     TSH + free T4  Essential hypertension -     CBC with Differential/Platelet  Mixed hyperlipidemia -     Lipid panel  GAD (generalized anxiety disorder) -     ALPRAZolam ; Take 1 tablet (0.5 mg total) by mouth at bedtime as needed for anxiety. TAKE 1 AND 1/2 TABLETS AT BEDTIME AS NEEDED FOR SLEEP  Dispense: 30 tablet; Refill: 2  Primary osteoarthritis involving multiple joints  Other orders -     HumaLOG  KwikPen; Inject 10 Units into the skin 3 (three) times daily.  Dispense: 15 mL; Refill: 11 -     Meloxicam ; Take 1 tablet (7.5 mg total) by mouth daily.  Dispense: 90 tablet; Refill: 1    Assessment and Plan Assessment & Plan Type 1 diabetes mellitus   His type 1 diabetes mellitus is well-controlled with a recent A1c of 6.8%. He effectively manages insulin  doses, reducing nighttime insulin  to prevent hypoglycemia. Transition from Novolog  to Humalog  is necessary due to insurance preferences. Prescribe Humalog  quick pen and  adjust the medication list to reflect 10 units of insulin  three times a day. Advise close monitoring of blood glucose, especially when fasting, and encourage regular exercise and a healthy diet.  Essential hypertension   His essential hypertension is currently high. He is on lisinopril  HCT, which may interact with meloxicam , potentially increasing blood pressure. Monitor blood pressure closely, especially with the introduction of meloxicam .  Chronic knee and arm pain   Chronic knee and arm pain is worsening, affecting mobility, particularly on stairs. Previously managed effectively with Mobic  (meloxicam ). Prescribe meloxicam  7.5 mg daily, to be sent to [facility]. Monitor kidney function regularly due to potential nephrotoxic effects and advise to watch for any increase in blood pressure as a side effect.  Chronic back pain   Chronic back pain persists, limiting activity levels. He manages activity to avoid exacerbating pain.  Hypothyroidism   Thyroid  function will be monitored with upcoming blood work.  Insomnia   Insomnia is managed with alprazolam , taken as 1.5  tablets at bedtime. He reports sleeping well. Refill alprazolam  prescription.       Follow-up: Return in about 3 months (around 09/15/2024).  Butler Der, M.D.

## 2024-06-17 ENCOUNTER — Ambulatory Visit: Payer: Self-pay | Admitting: Family Medicine

## 2024-06-17 LAB — CBC WITH DIFFERENTIAL/PLATELET
Basophils Absolute: 0 x10E3/uL (ref 0.0–0.2)
Basos: 1 %
EOS (ABSOLUTE): 0.3 x10E3/uL (ref 0.0–0.4)
Eos: 4 %
Hematocrit: 39.1 % (ref 37.5–51.0)
Hemoglobin: 13.1 g/dL (ref 13.0–17.7)
Immature Grans (Abs): 0 x10E3/uL (ref 0.0–0.1)
Immature Granulocytes: 0 %
Lymphocytes Absolute: 2.4 x10E3/uL (ref 0.7–3.1)
Lymphs: 33 %
MCH: 31.4 pg (ref 26.6–33.0)
MCHC: 33.5 g/dL (ref 31.5–35.7)
MCV: 94 fL (ref 79–97)
Monocytes Absolute: 0.6 x10E3/uL (ref 0.1–0.9)
Monocytes: 8 %
Neutrophils Absolute: 4 x10E3/uL (ref 1.4–7.0)
Neutrophils: 54 %
Platelets: 275 x10E3/uL (ref 150–450)
RBC: 4.17 x10E6/uL (ref 4.14–5.80)
RDW: 12.4 % (ref 11.6–15.4)
WBC: 7.2 x10E3/uL (ref 3.4–10.8)

## 2024-06-17 LAB — LIPID PANEL
Cholesterol, Total: 109 mg/dL (ref 100–199)
HDL: 54 mg/dL (ref >39)
LDL CALC COMMENT:: 2 ratio (ref 0.0–5.0)
LDL Chol Calc (NIH): 45 mg/dL (ref 0–99)
Triglycerides: 33 mg/dL (ref 0–149)
VLDL Cholesterol Cal: 10 mg/dL (ref 5–40)

## 2024-06-17 LAB — CMP14+EGFR
ALT: 26 IU/L (ref 0–44)
AST: 30 IU/L (ref 0–40)
Albumin: 4.2 g/dL (ref 3.9–4.9)
Alkaline Phosphatase: 61 IU/L (ref 47–123)
BUN/Creatinine Ratio: 24 (ref 10–24)
BUN: 21 mg/dL (ref 8–27)
Bilirubin Total: 0.9 mg/dL (ref 0.0–1.2)
CO2: 23 mmol/L (ref 20–29)
Calcium: 9.8 mg/dL (ref 8.6–10.2)
Chloride: 102 mmol/L (ref 96–106)
Creatinine, Ser: 0.87 mg/dL (ref 0.76–1.27)
Globulin, Total: 2.8 g/dL (ref 1.5–4.5)
Glucose: 73 mg/dL (ref 70–99)
Potassium: 4.5 mmol/L (ref 3.5–5.2)
Sodium: 139 mmol/L (ref 134–144)
Total Protein: 7 g/dL (ref 6.0–8.5)
eGFR: 96 mL/min/1.73 (ref >59)

## 2024-06-17 LAB — TSH+FREE T4
Free T4: 1.44 ng/dL (ref 0.82–1.77)
TSH: 2.04 u[IU]/mL (ref 0.450–4.500)

## 2024-06-17 NOTE — Progress Notes (Signed)
 Hello Charles Foley,  Your lab result is normal and/or stable.Some minor variations that are not significant are commonly marked abnormal, but do not represent any medical problem for you.  Best regards, Mechele Claude, M.D.

## 2024-07-15 ENCOUNTER — Encounter: Payer: Self-pay | Admitting: *Deleted

## 2024-08-11 ENCOUNTER — Telehealth: Payer: Self-pay | Admitting: Family Medicine

## 2024-08-11 NOTE — Telephone Encounter (Signed)
 Apt made. LS

## 2024-08-11 NOTE — Telephone Encounter (Signed)
 That can be done, prefer to do so at an office visit.

## 2024-08-11 NOTE — Telephone Encounter (Signed)
 Patent came in and wants to know if [employer] can take him off Tresiba  and put him on  Toujeo  Lantus  so he don't have to change insurance. Please call patient.

## 2024-08-15 ENCOUNTER — Encounter: Payer: Self-pay | Admitting: Family Medicine

## 2024-08-15 ENCOUNTER — Ambulatory Visit (INDEPENDENT_AMBULATORY_CARE_PROVIDER_SITE_OTHER): Admitting: Family Medicine

## 2024-08-15 VITALS — BP 122/79 | HR 60 | Temp 97.9°F | Ht 69.0 in | Wt 179.0 lb

## 2024-08-15 DIAGNOSIS — Z794 Long term (current) use of insulin: Secondary | ICD-10-CM | POA: Diagnosis not present

## 2024-08-15 DIAGNOSIS — E109 Type 1 diabetes mellitus without complications: Secondary | ICD-10-CM

## 2024-08-15 MED ORDER — TOUJEO MAX SOLOSTAR 300 UNIT/ML ~~LOC~~ SOPN: 65.0000 [IU] | PEN_INJECTOR | Freq: Two times a day (BID) | SUBCUTANEOUS | 3 refills | Status: DC

## 2024-08-15 NOTE — Progress Notes (Signed)
 Subjective:  Patient ID: Charles Foley, male    DOB: [birth year]  Age: 63 y.o. MRN: [id number]  CC: Medication Problem (Would like to get off tresiba  and get on toujeo . )   HPI  Discussed the use of AI scribe software for clinical note transcription with the patient, who gave verbal consent to proceed.  History of Present Illness Charles Foley is a 63 year old male with diabetes who presents for medication management related to his insurance coverage.  He is currently managing his diabetes with Tresiba  and Humalog . He takes Humalog  at a dose of ten units before each meal. His current insurance, [employer], does not cover Tresiba  but will cover Toujeo , which he has used in the past. He recalls that Toujeo  is similar to Lantus .  He administers Tresiba  at a dose of sixty units twice a day. He uses an insulin  pen for administration. He has already filled his current prescription for the next month.  He is considering switching to [facility], which would cover Tresiba  but not Humalog . However, [employer] does cover Novolog , which he believes is comparable to Humalog  for mealtime insulin .  He mentions experiencing stress related to his insurance and medication coverage, but he is managing it since his retirement. He also shares details about his family gatherings, indicating a supportive social environment.          05/25/2024   10:16 AM 03/10/2024    8:43 AM 09/11/2023    9:46 AM  Depression screen PHQ 2/9  Decreased Interest 0 0 0  Down, Depressed, Hopeless 0 0 0  PHQ - 2 Score 0 0 0  Altered sleeping  0   Tired, decreased energy  0   Change in appetite  0   Feeling bad or failure about yourself   0   Trouble concentrating  0   Moving slowly or fidgety/restless  0   Suicidal thoughts  0   PHQ-9 Score  0    Difficult doing work/chores  Not difficult at all      Data saved with a previous flowsheet row definition    History Charles Foley has a past medical history of Acute pain of  right knee (06/17/2017), Allergic rhinitis, Anxiety, Arthritis, Cancer (HCC) (2012), Chest pain (11/02/2014), Diabetes mellitus without complication (HCC), Hypertension, Hypothyroidism, and Uncontrolled type 1 diabetes mellitus with hyperglycemia (HCC) (11/02/2018).   He has a past surgical history that includes Prostatectomy (2012); ORIF facial fracture (1988); Knee arthroscopy (Bilateral); Colonoscopy; Lumbar laminectomy/decompression microdiscectomy (N/A, 10/11/2020); Tonsillectomy; Transforaminal lumbar interbody fusion w/ mis 1 level (Right, 06/11/2022); and Colonoscopy (N/A, 06/13/2024).   His family history includes Diabetes in his mother; Heart disease in his mother.He reports that he quit smoking about 15 years ago. His smoking use included cigarettes. He quit smokeless tobacco use about 21 years ago.  His smokeless tobacco use included chew. He reports that he does not currently use alcohol. He reports that he does not use drugs.    ROS Review of Systems  Objective:  BP 122/79   Pulse 60   Temp 97.9 F (36.6 C)   Ht 5' 9 (1.753 m)   Wt 179 lb (81.2 kg)   SpO2 97%   BMI 26.43 kg/m   BP Readings from Last 3 Encounters:  08/15/24 122/79  06/16/24 (!) 147/59  06/13/24 (!) 124/55    Wt Readings from Last 3 Encounters:  08/15/24 179 lb (81.2 kg)  06/16/24 176 lb (79.8 kg)  06/13/24 170 lb (77.1 kg)  Physical Exam Vitals reviewed.  Constitutional:      Appearance: He is well-developed.  HENT:     Head: Normocephalic and atraumatic.     Right Ear: External ear normal.     Left Ear: External ear normal.     Mouth/Throat:     Pharynx: No oropharyngeal exudate or posterior oropharyngeal erythema.  Eyes:     Pupils: Pupils are equal, round, and reactive to light.  Cardiovascular:     Rate and Rhythm: Normal rate and regular rhythm.     Heart sounds: No murmur heard. Pulmonary:     Effort: No respiratory distress.     Breath sounds: Normal breath sounds.   Musculoskeletal:     Cervical back: Normal range of motion and neck supple.  Neurological:     Mental Status: He is alert and oriented to person, place, and time.    Physical Exam GENERAL: Alert, cooperative, well developed, no acute distress HEENT: Normocephalic, normal oropharynx, moist mucous membranes CHEST: Clear to auscultation bilaterally, No wheezes, rhonchi, or crackles CARDIOVASCULAR: Normal heart rate and rhythm, S1 and S2 normal without murmurs ABDOMEN: Soft, non-tender, non-distended, without organomegaly, Normal bowel sounds EXTREMITIES: No cyanosis or edema NEUROLOGICAL: Cranial nerves grossly intact, Moves all extremities without gross motor or sensory deficit   Assessment & Plan:  Type 1 diabetes mellitus without complications (HCC)  Other orders -     Toujeo  Max SoloStar; Inject 65 Units into the skin 2 (two) times daily.  Dispense: 45 mL; Refill: 3    Assessment and Plan Assessment & Plan Type 2 diabetes mellitus on insulin  therapy   He switched from Tresiba  to Toujeo  SoloStar, 65 units twice daily, due to insurance coverage issues. Toujeo  is similar to Lantus  and may require dose adjustment. Monitoring fasting blood glucose levels is crucial to assess Toujeo 's efficacy. He will continue Humalog  at 10 units before each meal. Provided 45 mL of Toujeo  SoloStar for a three-month supply. He prefers to stay with [employer] for insurance coverage.       Follow-up: No follow-ups on file.  Butler Der, M.D.

## 2024-08-28 ENCOUNTER — Other Ambulatory Visit: Payer: Self-pay | Admitting: Family Medicine

## 2024-09-09 ENCOUNTER — Other Ambulatory Visit: Payer: Self-pay | Admitting: Family Medicine

## 2024-09-09 DIAGNOSIS — E109 Type 1 diabetes mellitus without complications: Secondary | ICD-10-CM

## 2024-09-10 ENCOUNTER — Other Ambulatory Visit: Payer: Self-pay | Admitting: Family Medicine

## 2024-09-10 DIAGNOSIS — F411 Generalized anxiety disorder: Secondary | ICD-10-CM

## 2024-09-10 DIAGNOSIS — E039 Hypothyroidism, unspecified: Secondary | ICD-10-CM

## 2024-09-26 ENCOUNTER — Ambulatory Visit: Admitting: Family Medicine

## 2024-10-12 ENCOUNTER — Other Ambulatory Visit: Payer: Self-pay

## 2024-10-12 ENCOUNTER — Other Ambulatory Visit: Payer: Self-pay | Admitting: Family Medicine

## 2024-10-12 MED ORDER — MELOXICAM 7.5 MG PO TABS: 7.5000 mg | ORAL_TABLET | Freq: Every day | ORAL | 0 refills | Status: AC

## 2024-10-12 NOTE — Telephone Encounter (Signed)
 Copied from CRM #[id number]. Topic: Clinical - Medication Refill >> Oct 12, 2024  8:28 AM Cherylann RAMAN wrote: Medication: meloxicam  (MOBIC ) 7.5 MG tablet  Has the patient contacted their pharmacy? Yes (Agent: If no, request that the patient contact the pharmacy for the refill. If patient does not wish to contact the pharmacy document the reason why and proceed with request.) (Agent: If yes, when and what did the pharmacy advise?)  This is the patient's preferred pharmacy:  [facility] [place], [place] - [address] [place] [address] Phone: [phone] Fax: [phone]  Is this the correct pharmacy for this prescription? Yes If no, delete pharmacy and type the correct one.   Has the prescription been filled recently? No  Is the patient out of the medication? Yes  Has the patient been seen for an appointment in the last year OR does the patient have an upcoming appointment? Yes  Can we respond through MyChart? No  Agent: Please be advised that Rx refills may take up to 3 business days. We ask that you follow-up with your pharmacy.

## 2024-10-12 NOTE — Telephone Encounter (Signed)
 Copied from CRM #[id number]. Topic: Clinical - Medication Refill >> Oct 12, 2024  8:28 AM Cherylann RAMAN wrote: Medication: meloxicam  (MOBIC ) 7.5 MG tablet  Has the patient contacted their pharmacy? Yes (Agent: If no, request that the patient contact the pharmacy for the refill. If patient does not wish to contact the pharmacy document the reason why and proceed with request.) (Agent: If yes, when and what did the pharmacy advise?)  This is the patient's preferred pharmacy:  [facility] [place], [place] - [address] [place] [address] Phone: [phone] Fax: [phone]  Is this the correct pharmacy for this prescription? Yes If no, delete pharmacy and type the correct one.   Has the prescription been filled recently? No  Is the patient out of the medication? Yes  Has the patient been seen for an appointment in the last year OR does the patient have an upcoming appointment? Yes  Can we respond through MyChart? No  Agent: Please be advised that Rx refills may take up to 3 business days. We ask that you follow-up with your pharmacy.

## 2024-10-27 ENCOUNTER — Encounter: Payer: Self-pay | Admitting: Family Medicine

## 2024-10-27 ENCOUNTER — Ambulatory Visit: Payer: Self-pay | Admitting: Family Medicine

## 2024-10-27 VITALS — BP 148/64 | HR 69 | Temp 97.5°F | Ht 69.0 in | Wt 192.0 lb

## 2024-10-27 DIAGNOSIS — M5416 Radiculopathy, lumbar region: Secondary | ICD-10-CM

## 2024-10-27 DIAGNOSIS — F411 Generalized anxiety disorder: Secondary | ICD-10-CM

## 2024-10-27 DIAGNOSIS — E109 Type 1 diabetes mellitus without complications: Secondary | ICD-10-CM

## 2024-10-27 DIAGNOSIS — E782 Mixed hyperlipidemia: Secondary | ICD-10-CM

## 2024-10-27 DIAGNOSIS — E039 Hypothyroidism, unspecified: Secondary | ICD-10-CM

## 2024-10-27 LAB — CBC WITH DIFFERENTIAL/PLATELET
Basophils Absolute: 0.1 10*3/uL (ref 0.0–0.2)
Basos: 1 %
EOS (ABSOLUTE): 0.3 10*3/uL (ref 0.0–0.4)
Eos: 4 %
Hematocrit: 43.4 % (ref 37.5–51.0)
Hemoglobin: 13.9 g/dL (ref 13.0–17.7)
Immature Grans (Abs): 0 10*3/uL (ref 0.0–0.1)
Immature Granulocytes: 0 %
Lymphocytes Absolute: 2.6 10*3/uL (ref 0.7–3.1)
Lymphs: 31 %
MCH: 30.2 pg (ref 26.6–33.0)
MCHC: 32 g/dL (ref 31.5–35.7)
MCV: 94 fL (ref 79–97)
Monocytes Absolute: 0.6 10*3/uL (ref 0.1–0.9)
Monocytes: 7 %
Neutrophils Absolute: 4.8 10*3/uL (ref 1.4–7.0)
Neutrophils: 57 %
Platelets: 272 10*3/uL (ref 150–450)
RBC: 4.61 x10E6/uL (ref 4.14–5.80)
RDW: 12.7 % (ref 11.6–15.4)
WBC: 8.3 10*3/uL (ref 3.4–10.8)

## 2024-10-27 LAB — CMP14+EGFR
ALT: 29 [IU]/L (ref 0–44)
AST: 25 [IU]/L (ref 0–40)
Albumin: 4.6 g/dL (ref 3.9–4.9)
Alkaline Phosphatase: 71 [IU]/L (ref 47–123)
BUN/Creatinine Ratio: 19 (ref 10–24)
BUN: 18 mg/dL (ref 8–27)
Bilirubin Total: 0.8 mg/dL (ref 0.0–1.2)
CO2: 24 mmol/L (ref 20–29)
Calcium: 10.4 mg/dL — ABNORMAL HIGH (ref 8.6–10.2)
Chloride: 102 mmol/L (ref 96–106)
Creatinine, Ser: 0.93 mg/dL (ref 0.76–1.27)
Globulin, Total: 2.9 g/dL (ref 1.5–4.5)
Glucose: 142 mg/dL — ABNORMAL HIGH (ref 70–99)
Potassium: 5.1 mmol/L (ref 3.5–5.2)
Sodium: 140 mmol/L (ref 134–144)
Total Protein: 7.5 g/dL (ref 6.0–8.5)
eGFR: 91 mL/min/{1.73_m2}

## 2024-10-27 LAB — LIPID PANEL
Chol/HDL Ratio: 2.5 ratio (ref 0.0–5.0)
Cholesterol, Total: 154 mg/dL (ref 100–199)
HDL: 61 mg/dL
LDL Chol Calc (NIH): 80 mg/dL (ref 0–99)
Triglycerides: 68 mg/dL (ref 0–149)
VLDL Cholesterol Cal: 13 mg/dL (ref 5–40)

## 2024-10-27 LAB — TSH: TSH: 3.82 u[IU]/mL (ref 0.450–4.500)

## 2024-10-27 LAB — BAYER DCA HB A1C WAIVED: HB A1C (BAYER DCA - WAIVED): 7.5 % — ABNORMAL HIGH (ref 4.8–5.6)

## 2024-10-27 MED ORDER — TOUJEO MAX SOLOSTAR 300 UNIT/ML ~~LOC~~ SOPN: 65.0000 [IU] | PEN_INJECTOR | Freq: Two times a day (BID) | SUBCUTANEOUS | 3 refills | Status: AC

## 2024-10-27 MED ORDER — CYCLOBENZAPRINE HCL 10 MG PO TABS: 10.0000 mg | ORAL_TABLET | Freq: Three times a day (TID) | ORAL | 3 refills | Status: AC | PRN

## 2024-10-27 MED ORDER — PANTOPRAZOLE SODIUM 40 MG PO TBEC: 40.0000 mg | DELAYED_RELEASE_TABLET | Freq: Every day | ORAL | 3 refills | Status: AC

## 2024-10-27 MED ORDER — INSULIN LISPRO (1 UNIT DIAL) 100 UNIT/ML (KWIKPEN): 10.0000 [IU] | PEN_INJECTOR | Freq: Three times a day (TID) | SUBCUTANEOUS | 11 refills | Status: AC

## 2024-10-27 MED ORDER — ATORVASTATIN CALCIUM 10 MG PO TABS: 10.0000 mg | ORAL_TABLET | Freq: Every day | ORAL | 0 refills | Status: AC

## 2024-10-27 MED ORDER — LISINOPRIL-HYDROCHLOROTHIAZIDE 20-12.5 MG PO TABS: 1.0000 | ORAL_TABLET | Freq: Every day | ORAL | 0 refills | Status: AC

## 2024-10-27 MED ORDER — ALPRAZOLAM 0.5 MG PO TABS: 0.5000 mg | ORAL_TABLET | Freq: Every day | ORAL | 5 refills | Status: AC

## 2024-10-27 MED ORDER — LEVOTHYROXINE SODIUM 25 MCG PO TABS: 25.0000 ug | ORAL_TABLET | Freq: Every day | ORAL | 2 refills | Status: AC

## 2024-10-27 MED ORDER — TRAZODONE HCL 150 MG PO TABS: ORAL_TABLET | ORAL | 3 refills | Status: AC

## 2025-01-31 ENCOUNTER — Ambulatory Visit: Admitting: Family Medicine
